# Patient Record
Sex: Male | Born: 1940
Health system: Southern US, Community
[De-identification: ages and names within clinical notes are randomized; demographics above are authoritative.]

## PROBLEM LIST (undated history)

## (undated) DIAGNOSIS — S065X9A Traumatic subdural hemorrhage with loss of consciousness of unspecified duration, initial encounter: Secondary | ICD-10-CM

## (undated) DIAGNOSIS — E119 Type 2 diabetes mellitus without complications: Secondary | ICD-10-CM

## (undated) DIAGNOSIS — S065XAA Traumatic subdural hemorrhage with loss of consciousness status unknown, initial encounter: Secondary | ICD-10-CM

## (undated) DIAGNOSIS — N200 Calculus of kidney: Secondary | ICD-10-CM

## (undated) DIAGNOSIS — R569 Unspecified convulsions: Secondary | ICD-10-CM

## (undated) DIAGNOSIS — M7020 Olecranon bursitis, unspecified elbow: Secondary | ICD-10-CM

## (undated) DIAGNOSIS — I10 Essential (primary) hypertension: Secondary | ICD-10-CM

## (undated) DIAGNOSIS — I639 Cerebral infarction, unspecified: Secondary | ICD-10-CM

## (undated) HISTORY — DX: Traumatic subdural hemorrhage with loss of consciousness of unspecified duration, initial encounter: S06.5X9A

## (undated) HISTORY — DX: Cerebral infarction, unspecified: I63.9

## (undated) HISTORY — DX: Traumatic subdural hemorrhage with loss of consciousness status unknown, initial encounter: S06.5XAA

## (undated) HISTORY — DX: Type 2 diabetes mellitus without complications: E11.9

## (undated) HISTORY — DX: Unspecified convulsions: R56.9

---

## 1993-11-10 HISTORY — PX: REFRACTIVE SURGERY: SHX103

## 2000-08-06 ENCOUNTER — Encounter: Payer: Self-pay | Admitting: Family Medicine

## 2000-08-06 LAB — CONVERTED CEMR LAB
Blood Glucose, Fasting: 106 mg/dL
Blood Glucose, Fasting: 106 mg/dL
PSA: 1 ng/mL
RBC count: 5.41 10*6/uL
TSH: 1.41 microintl units/mL
WBC, blood: 4.7 10*3/uL

## 2001-06-14 ENCOUNTER — Emergency Department (HOSPITAL_COMMUNITY): Admission: EM | Admit: 2001-06-14 | Discharge: 2001-06-14 | Payer: Self-pay | Admitting: Emergency Medicine

## 2001-06-14 ENCOUNTER — Encounter: Payer: Self-pay | Admitting: Emergency Medicine

## 2002-09-16 ENCOUNTER — Encounter: Payer: Self-pay | Admitting: Family Medicine

## 2002-09-16 LAB — CONVERTED CEMR LAB: Blood Glucose, Fasting: 85 mg/dL

## 2004-11-28 ENCOUNTER — Ambulatory Visit: Payer: Self-pay | Admitting: Family Medicine

## 2004-11-28 LAB — CONVERTED CEMR LAB
Blood Glucose, Fasting: 108 mg/dL
PSA: 0.65 ng/mL
TSH: 1.27 microintl units/mL

## 2004-12-03 ENCOUNTER — Ambulatory Visit: Payer: Self-pay | Admitting: Family Medicine

## 2004-12-11 ENCOUNTER — Ambulatory Visit: Payer: Self-pay | Admitting: Family Medicine

## 2005-01-02 ENCOUNTER — Ambulatory Visit: Payer: Self-pay | Admitting: Family Medicine

## 2005-04-15 ENCOUNTER — Ambulatory Visit: Payer: Self-pay | Admitting: Family Medicine

## 2005-12-04 ENCOUNTER — Ambulatory Visit: Payer: Self-pay | Admitting: Family Medicine

## 2005-12-04 LAB — CONVERTED CEMR LAB
Blood Glucose, Fasting: 114 mg/dL
Blood Glucose, Fasting: 114 mg/dL
Hgb A1c MFr Bld: 5.1 %
Microalbumin U total vol: 2.7 mg/L
PSA: 0.99 ng/mL
TSH: 1.54 microintl units/mL
TSH: 2.23 microintl units/mL

## 2005-12-08 ENCOUNTER — Ambulatory Visit: Payer: Self-pay | Admitting: Family Medicine

## 2006-01-14 LAB — FECAL OCCULT BLOOD, GUAIAC: Fecal Occult Blood: NEGATIVE

## 2006-01-15 ENCOUNTER — Ambulatory Visit: Payer: Self-pay | Admitting: Family Medicine

## 2006-12-04 ENCOUNTER — Ambulatory Visit: Payer: Self-pay | Admitting: Family Medicine

## 2006-12-04 LAB — CONVERTED CEMR LAB
ALT: 37 units/L (ref 0–40)
AST: 31 units/L (ref 0–37)
Albumin: 4.3 g/dL (ref 3.5–5.2)
Alkaline Phosphatase: 48 units/L (ref 39–117)
BUN: 16 mg/dL (ref 6–23)
Blood Glucose, Fasting: 84 mg/dL
CO2: 29 meq/L (ref 19–32)
Calcium: 9.8 mg/dL (ref 8.4–10.5)
Chloride: 104 meq/L (ref 96–112)
Cholesterol: 134 mg/dL (ref 0–200)
Creatinine, Ser: 1.1 mg/dL (ref 0.4–1.5)
Creatinine,U: 206.5 mg/dL
GFR calc Af Amer: 87 mL/min
GFR calc non Af Amer: 72 mL/min
Glucose, Bld: 84 mg/dL (ref 70–99)
HDL: 36 mg/dL — ABNORMAL LOW (ref >39.0)
LDL Cholesterol: 87 mg/dL (ref 0–99)
Microalb Creat Ratio: 5.8 mg/g (ref 0.0–30.0)
Microalb, Ur: 1.2 mg/dL (ref 0.0–1.9)
Microalbumin U total vol: 5.8 mg/L
PSA: 0.87 ng/mL
PSA: 0.87 ng/mL (ref 0.10–4.00)
Potassium: 4.1 meq/L (ref 3.5–5.1)
Sodium: 140 meq/L (ref 135–145)
TSH: 1.54 microintl units/mL
TSH: 1.54 microintl units/mL (ref 0.35–5.50)
Total Bilirubin: 2 mg/dL — ABNORMAL HIGH (ref 0.3–1.2)
Total CHOL/HDL Ratio: 3.7
Total Protein: 6.9 g/dL (ref 6.0–8.3)
Triglycerides: 55 mg/dL (ref 0–149)
VLDL: 11 mg/dL (ref 0–40)

## 2006-12-08 ENCOUNTER — Ambulatory Visit: Payer: Self-pay | Admitting: Family Medicine

## 2007-03-11 ENCOUNTER — Ambulatory Visit: Payer: Self-pay | Admitting: Family Medicine

## 2007-04-02 ENCOUNTER — Ambulatory Visit: Payer: Self-pay | Admitting: Family Medicine

## 2008-03-20 ENCOUNTER — Ambulatory Visit: Payer: Self-pay | Admitting: Family Medicine

## 2008-03-20 LAB — CONVERTED CEMR LAB
ALT: 35 units/L (ref 0–53)
AST: 27 units/L (ref 0–37)
Albumin: 4.1 g/dL (ref 3.5–5.2)
Alkaline Phosphatase: 38 units/L — ABNORMAL LOW (ref 39–117)
BUN: 14 mg/dL (ref 6–23)
Bilirubin, Direct: 0.1 mg/dL (ref 0.0–0.3)
CO2: 30 meq/L (ref 19–32)
Calcium: 9 mg/dL (ref 8.4–10.5)
Chloride: 107 meq/L (ref 96–112)
Cholesterol: 132 mg/dL (ref 0–200)
Creatinine, Ser: 0.9 mg/dL (ref 0.4–1.5)
Creatinine,U: 45.5 mg/dL
GFR calc Af Amer: 109 mL/min
GFR calc non Af Amer: 90 mL/min
Glucose, Bld: 121 mg/dL — ABNORMAL HIGH (ref 70–99)
HDL: 28.8 mg/dL — ABNORMAL LOW (ref >39.0)
LDL Cholesterol: 87 mg/dL (ref 0–99)
Microalb Creat Ratio: 46.2 mg/g — ABNORMAL HIGH (ref 0.0–30.0)
Microalb, Ur: 2.1 mg/dL — ABNORMAL HIGH (ref 0.0–1.9)
Potassium: 4.1 meq/L (ref 3.5–5.1)
Sodium: 140 meq/L (ref 135–145)
TSH: 2.1 microintl units/mL (ref 0.35–5.50)
Total Bilirubin: 1.4 mg/dL — ABNORMAL HIGH (ref 0.3–1.2)
Total CHOL/HDL Ratio: 4.6
Total Protein: 6.7 g/dL (ref 6.0–8.3)
Triglycerides: 79 mg/dL (ref 0–149)
VLDL: 16 mg/dL (ref 0–40)

## 2008-03-24 ENCOUNTER — Ambulatory Visit: Payer: Self-pay | Admitting: Family Medicine

## 2008-05-30 ENCOUNTER — Encounter: Payer: Self-pay | Admitting: Family Medicine

## 2008-05-30 DIAGNOSIS — I10 Essential (primary) hypertension: Secondary | ICD-10-CM

## 2008-05-30 DIAGNOSIS — E749 Disorder of carbohydrate metabolism, unspecified: Secondary | ICD-10-CM | POA: Insufficient documentation

## 2008-05-30 DIAGNOSIS — E7800 Pure hypercholesterolemia, unspecified: Secondary | ICD-10-CM | POA: Insufficient documentation

## 2008-05-30 DIAGNOSIS — Z87442 Personal history of urinary calculi: Secondary | ICD-10-CM

## 2009-06-13 ENCOUNTER — Ambulatory Visit: Payer: Self-pay | Admitting: Family Medicine

## 2009-06-13 LAB — CONVERTED CEMR LAB
ALT: 54 units/L — ABNORMAL HIGH (ref 0–53)
AST: 34 units/L (ref 0–37)
Albumin: 4.3 g/dL (ref 3.5–5.2)
Alkaline Phosphatase: 42 units/L (ref 39–117)
BUN: 13 mg/dL (ref 6–23)
Bilirubin, Direct: 0.2 mg/dL (ref 0.0–0.3)
CO2: 30 meq/L (ref 19–32)
Calcium: 9.4 mg/dL (ref 8.4–10.5)
Chloride: 105 meq/L (ref 96–112)
Cholesterol: 107 mg/dL (ref 0–200)
Creatinine, Ser: 0.9 mg/dL (ref 0.4–1.5)
GFR calc non Af Amer: 89.52 mL/min (ref >60)
Glucose, Bld: 110 mg/dL — ABNORMAL HIGH (ref 70–99)
HDL: 26.1 mg/dL — ABNORMAL LOW (ref >39.00)
LDL Cholesterol: 57 mg/dL (ref 0–99)
PSA: 1.12 ng/mL (ref 0.10–4.00)
Potassium: 3.8 meq/L (ref 3.5–5.1)
Sodium: 141 meq/L (ref 135–145)
TSH: 2.07 microintl units/mL (ref 0.35–5.50)
Total Bilirubin: 1.8 mg/dL — ABNORMAL HIGH (ref 0.3–1.2)
Total CHOL/HDL Ratio: 4
Total Protein: 7.3 g/dL (ref 6.0–8.3)
Triglycerides: 119 mg/dL (ref 0.0–149.0)
VLDL: 23.8 mg/dL (ref 0.0–40.0)

## 2009-06-14 LAB — CONVERTED CEMR LAB: Vit D, 25-Hydroxy: 54 ng/mL (ref 30–89)

## 2009-06-18 ENCOUNTER — Ambulatory Visit: Payer: Self-pay | Admitting: Family Medicine

## 2010-06-13 ENCOUNTER — Encounter (INDEPENDENT_AMBULATORY_CARE_PROVIDER_SITE_OTHER): Payer: Self-pay | Admitting: *Deleted

## 2010-09-02 ENCOUNTER — Ambulatory Visit: Payer: Self-pay | Admitting: Family Medicine

## 2010-09-03 LAB — CONVERTED CEMR LAB
Bilirubin, Direct: 0.2 mg/dL (ref 0.0–0.3)
Creatinine, Ser: 1 mg/dL (ref 0.4–1.5)
GFR calc non Af Amer: 81.8 mL/min (ref >60)
Glucose, Bld: 115 mg/dL — ABNORMAL HIGH (ref 70–99)
HDL: 29.8 mg/dL — ABNORMAL LOW (ref >39.00)
LDL Cholesterol: 62 mg/dL (ref 0–99)
Potassium: 4.3 meq/L (ref 3.5–5.1)
Sodium: 136 meq/L (ref 135–145)
Total Bilirubin: 1.3 mg/dL — ABNORMAL HIGH (ref 0.3–1.2)
Total CHOL/HDL Ratio: 4
Triglycerides: 141 mg/dL (ref 0.0–149.0)
VLDL: 28.2 mg/dL (ref 0.0–40.0)

## 2010-09-04 ENCOUNTER — Ambulatory Visit: Payer: Self-pay | Admitting: Family Medicine

## 2010-12-10 NOTE — Letter (Signed)
Summary: Nadara Eaton letter  [place] [facility]  [address] [place], [place] [address]   Phone: [phone]  Fax: [phone]       06/13/2010 MRN: [id number]  [facility] [address] [place], [place]  [address]  Dear Mr. Leeanne Mannan [facility] [place], and [place] announce the retirement of Arta Silence, M.D., from full-time practice at the [facility] office effective May 09, 2010 and his plans of returning part-time.  It is important to Dr. Hetty Ely and to our practice that you understand that [facility] has seven physicians in our office for your health care needs.  We will continue to offer the same exceptional care that you have today.    Dr. Hetty Ely has spoken to many of you about his plans for retirement and returning part-time in the fall.   We will continue to work with you through the transition to schedule appointments for you in the office and meet the high standards that [place] is committed to.   Again, it is with great pleasure that we share the news that Dr. Hetty Ely will return to [facility] at [facility] in October of 2011 with a reduced schedule.    If you have any questions, or would like to request an appointment with one of our physicians, please call us at [phone] and press the option for Scheduling an appointment.  We take pleasure in providing you with excellent patient care and look forward to seeing you at your next office visit.  Our [facility] Physicians are:  Tillman Abide, M.D. Laurita Quint, M.D. Roxy Manns, M.D. Kerby Nora, M.D. Hannah Beat, M.D. Ruthe Mannan, M.D. We proudly welcomed Raechel Ache, M.D. and Eustaquio Boyden, M.D. to the practice in July/August 2011.  Sincerely,  [place] [facility]

## 2010-12-10 NOTE — Assessment & Plan Note (Signed)
Summary: CPX/CLE   Vital Signs:  Patient profile:   70 year old male Weight:      206.50 pounds BMI:     30.60 Temp:     97.9 degrees F oral Pulse rate:   72 / minute Pulse rhythm:   regular BP sitting:   128 / 76  (left arm) Cuff size:   regular  Vitals Entered By: Sydell Axon LPN (September 04, 2010 10:56 AM) CC: 30 minute checkup   History of Present Illness: Pt here for Comp Exam. He feels well and has no complaints. he is taking Hyaluronic Acid which has significantly helped and resolved his pain. He is also taking antioxidants.   Preventive Screening-Counseling & Management  Alcohol-Tobacco     Alcohol drinks/day: 0     Smoking Status: never     Passive Smoke Exposure: no  Caffeine-Diet-Exercise     Caffeine use/day: 0     Does Patient Exercise: yes     Type of exercise: Walks 2 mile     Times/week: 3  Problems Prior to Update: 1)  Special Screening Malignant Neoplasm of Prostate  (ICD-V76.44) 2)  Gilbert's Syndrome  (ICD-277.4) 3)  Renal Calculus, Hx of  (ICD-V13.01) 4)  Health Maintenance Exam  (ICD-V70.0) 5)  Carbohydrate Metabolism Disorder  (ICD-271.9) 6)  Hypercholesterolemia  (ICD-272.0) 7)  Essential Hypertension, Benign  (ICD-401.1) 8)  Bursitis, Olecranon  (ICD-726.33)  Medications Prior to Update: 1)  Triamterene-Hctz 37.5-25 Mg Tabs (Triamterene-Hctz) .... 1/2 Tablet Daily By Mouth 2)  Hyaluronic Acid 20-60 Mg Caps (Hyaluronic Acid-Vitamin C) .Marland Kitchen.. 1 Daily By Mouth 3)  Vitamins C; Vitamin E; Vitamin B6; B12; .... 1 Each Daily By Mouth 4)  Coq10 50 Mg Caps (Coenzyme Q10) .Marland Kitchen.. 1 Daily By Mouth 5)  Garlic Oil 1000 Mg Caps (Garlic) .Marland Kitchen.. 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 [employer] Married, lives w/ wife   Adopted Son Marital Status: Married  Risk Factors: Alcohol Use: 0 (09/04/2010) Caffeine Use: 0 (09/04/2010) Exercise: yes (09/04/2010)  Risk Factors: Smoking Status: never (09/04/2010) Passive Smoke Exposure: no (09/04/2010)  Family History: Father dec 89 Bone Ca Mother dec 84 Stroke Thyr Dz Htn DM Brother A 72 Past  Etoh Thyr Dz Sister A 70  CV: NEGATIVE HBP: + MOTHER DM: + MOTHER  PAST 75 YOA GOUT/ARTHRITIS: PROSTATE CANCER: +FATHER BONE CANCER BREAST/OVARIAN/UTERINE CANCER: NEGATIVE COLON CANCER: DEPRESSION: NEGATIVE ETOH ABUSE: + BROTHER OTHER: POSITIVE STROKE MOTHER; +MGF  Review of Systems General:  Denies chills, fatigue, fever, sweats, weakness, and weight loss. Eyes:  Denies blurring, discharge, and eye pain. ENT:  Complains of ringing in ears; denies decreased hearing and earache; chronic. CV:  Denies chest pain or discomfort, fainting, fatigue, palpitations, shortness of breath with exertion, swelling of feet, and swelling of hands. Resp:  Denies cough, shortness of breath, and wheezing. GI:  Denies abdominal pain, bloody stools, change in bowel habits, constipation, dark tarry stools, diarrhea, indigestion, loss of  appetite, nausea, vomiting, vomiting blood, and yellowish skin color. GU:  Complains of nocturia; denies discharge, dysuria, and incontinence; bedtime water intake. MS:  Denies joint pain, low back pain, muscle aches, cramps, and stiffness. Derm:  Denies dryness, itching, and rash; spot on back. Neuro:  Denies numbness, poor balance, tingling, tremors, and weakness.  Physical Exam  General:   Well-developed,well-nourished,in no acute distress; alert,appropriate and cooperative throughout examination Head:  Normocephalic and atraumatic without obvious abnormalities. No apparent alopecia or balding. Eyes:  Conjunctiva clear bilaterally.  Ears:  External ear exam shows no significant lesions or deformities.  Otoscopic examination reveals clear canals, tympanic membranes are intact bilaterally without bulging, retraction, inflammation or discharge. Hearing is grossly normal bilaterally.  Significant cerumen in the right ear. Nose:  External nasal examination shows no deformity or inflammation. Nasal mucosa are pink and moist without lesions or exudates. Mouth:  Oral mucosa and oropharynx without lesions or exudates.  Teeth in good repair. Neck:  No deformities, masses, or tenderness noted. Chest Wall:  No deformities, masses, tenderness or gynecomastia noted. Breasts:  No masses or gynecomastia noted Lungs:  Normal respiratory effort, chest expands symmetrically. Lungs are clear to auscultation, no crackles or wheezes. Heart:  Normal rate and regular rhythm. S1 and S2 normal without gallop, murmur, click, rub or other extra sounds. Abdomen:  Bowel sounds positive,abdomen soft and non-tender without masses, organomegaly or hernias noted. Rectal:  No external abnormalities noted. Normal sphincter tone. No rectal masses or tenderness. G neg. Genitalia:  Testes bilaterally descended without nodularity, tenderness or masses. No scrotal masses or lesions. No penis lesions or urethral discharge. Prostate:  Prostate gland firm and smooth, no enlargement, nodularity, tenderness, mass, asymmetry or induration. 20gms. Msk:  No deformity or scoliosis noted of thoracic or lumbar spine.   Pulses:  R and L carotid,radial,femoral,dorsalis pedis and posterior tibial pulses are full and equal bilaterally Extremities:  No clubbing, cyanosis, edema, or deformity noted with normal full range of motion of all  joints.   Neurologic:  No cranial nerve deficits noted. Station and gait are normal. Sensory, motor and coordinative functions appear intact. Skin:  Intact without suspicious lesions or rashes Cervical Nodes:  No lymphadenopathy noted Inguinal Nodes:  No significant adenopathy Psych:  Cognition and judgment appear intact. Alert and cooperative with normal attention span and concentration. No apparent delusions, illusions, hallucinations   Impression & Recommendations:  Problem # 1:  HEALTH MAINTENANCE EXAM (ICD-V70.0)  Reviewed preventive care protocols, scheduled due services, and updated immunizations. I have personally reviewed the Medicare Annual Wellness questionnaire and have noted 1.   The patient's medical and social history 2.   Their use of alcohol, tobacco or illicit drugs 3.   Their current medications and supplements 4.   The patient's functional ability including ADL's, fall risks, home safety risks and hearing or visual             impairment. 5.   Diet and physical activities 6.   Evidence for depression or mood disorders  Problem # 2:  SPECIAL SCREENING MALIGNANT NEOPLASM OF PROSTATE (ICD-V76.44) Stable PSA and exam.  Problem # 3:  CARBOHYDRATE METABOLISM DISORDER (ICD-271.9) Assessment: Unchanged Stable, Glu from 120s to 110s typically yearly. Encouraged to avoid sweets and carbs.  Problem # 4:  HYPERCHOLESTEROLEMIA (ICD-272.0) Assessment: Unchanged Acceptable but avoid sweets and carbs. Labs Reviewed: SGOT: 33 (09/02/2010)   SGPT: 45 (09/02/2010)   HDL:29.80 (09/02/2010), 26.10 (06/13/2009)  LDL:62 (09/02/2010), 57 (06/13/2009)  Chol:120 (09/02/2010), 107 (06/13/2009)  Trig:141.0 (09/02/2010), 119.0 (06/13/2009)  Problem # 5:  ESSENTIAL HYPERTENSION, BENIGN (ICD-401.1) Assessment: Improved Stable. Cont. His updated medication list for this problem includes:    Triamterene-hctz 37.5-25 Mg Tabs (Triamterene-hctz) .Marland Kitchen... 1/2 tablet daily by mouth  BP today:  128/76 Prior BP: 140/80 (06/18/2009)  Labs Reviewed: K+: 4.3 (09/02/2010) Creat: : 1.0 (09/02/2010)   Chol: 120 (09/02/2010)   HDL: 29.80 (09/02/2010)   LDL: 62 (09/02/2010)   TG: 141.0 (09/02/2010)  Complete Medication List: 1)  Triamterene-hctz 37.5-25 Mg Tabs (Triamterene-hctz) .... 1/2 tablet daily by mouth 2)  Coq10 50 Mg Caps (Coenzyme q10) .Marland Kitchen.. 1 daily by mouth 3)  Garlic Oil 1000 Mg Caps (Garlic) .Marland Kitchen.. 1 daily by mouth 4)  Vitamin C 1000 Mg Tabs (Ascorbic acid) .... Take one by mouth two times a day 5)  Vitamin E/selenium 400-50 Unit-mcg Caps (Vitamin mixture) .... Take one by mouth daily 6)  Coq-10 200 Mg Caps (Coenzyme q10) .... Take one by mouth daily 7)  Vitamin B-6 100 Mg Tabs (Pyridoxine hcl) .... Take one by mouth daily Prescriptions: TRIAMTERENE-HCTZ 37.5-25 MG TABS (TRIAMTERENE-HCTZ) 1/2 tablet daily by mouth  #45 x 4   Entered and Authorized by:   Shaune Leeks MD   Signed by:   Shaune Leeks MD on 09/04/2010   Method used:   Electronically to        [facility] [phone]* (retail)       [address] [place], [place]  [address]       Ph: [phone]       Fax: [phone]   RxID:   [id number]    Orders Added: 1)  Est. Patient 65& > [24401]    Current Allergies (reviewed today): No known allergies

## 2011-09-04 ENCOUNTER — Other Ambulatory Visit: Payer: Self-pay | Admitting: Family Medicine

## 2011-09-04 DIAGNOSIS — E7800 Pure hypercholesterolemia, unspecified: Secondary | ICD-10-CM

## 2011-09-04 DIAGNOSIS — Z125 Encounter for screening for malignant neoplasm of prostate: Secondary | ICD-10-CM

## 2011-09-04 DIAGNOSIS — E749 Disorder of carbohydrate metabolism, unspecified: Secondary | ICD-10-CM

## 2011-09-08 ENCOUNTER — Other Ambulatory Visit (INDEPENDENT_AMBULATORY_CARE_PROVIDER_SITE_OTHER): Payer: MEDICARE

## 2011-09-08 DIAGNOSIS — Z125 Encounter for screening for malignant neoplasm of prostate: Secondary | ICD-10-CM

## 2011-09-08 DIAGNOSIS — E749 Disorder of carbohydrate metabolism, unspecified: Secondary | ICD-10-CM

## 2011-09-08 DIAGNOSIS — E7800 Pure hypercholesterolemia, unspecified: Secondary | ICD-10-CM

## 2011-09-08 LAB — CBC WITH DIFFERENTIAL/PLATELET
Basophils Relative: 0.8 % (ref 0.0–3.0)
Eosinophils Relative: 2.6 % (ref 0.0–5.0)
HCT: 47.7 % (ref 39.0–52.0)
Lymphs Abs: 1.6 10*3/uL (ref 0.7–4.0)
MCV: 95.8 fl (ref 78.0–100.0)
Monocytes Absolute: 0.4 10*3/uL (ref 0.1–1.0)
Platelets: 169 10*3/uL (ref 150.0–400.0)
WBC: 4.4 10*3/uL — ABNORMAL LOW (ref 4.5–10.5)

## 2011-09-08 LAB — RENAL FUNCTION PANEL
Albumin: 4.2 g/dL (ref 3.5–5.2)
CO2: 28 mEq/L (ref 19–32)
Calcium: 9.5 mg/dL (ref 8.4–10.5)
Chloride: 103 mEq/L (ref 96–112)
Potassium: 4.6 mEq/L (ref 3.5–5.1)

## 2011-09-08 LAB — HEPATIC FUNCTION PANEL
ALT: 40 U/L (ref 0–53)
AST: 29 U/L (ref 0–37)
Alkaline Phosphatase: 44 U/L (ref 39–117)
Bilirubin, Direct: 0.2 mg/dL (ref 0.0–0.3)
Total Bilirubin: 1 mg/dL (ref 0.3–1.2)

## 2011-09-08 LAB — LIPID PANEL
LDL Cholesterol: 62 mg/dL (ref 0–99)
Total CHOL/HDL Ratio: 4

## 2011-09-08 LAB — MICROALBUMIN / CREATININE URINE RATIO: Microalb, Ur: 0.2 mg/dL (ref 0.0–1.9)

## 2011-09-16 ENCOUNTER — Encounter: Payer: Self-pay | Admitting: Family Medicine

## 2011-09-18 ENCOUNTER — Ambulatory Visit (INDEPENDENT_AMBULATORY_CARE_PROVIDER_SITE_OTHER): Payer: MEDICARE | Admitting: Family Medicine

## 2011-09-18 ENCOUNTER — Encounter: Payer: Self-pay | Admitting: Family Medicine

## 2011-09-18 DIAGNOSIS — Z0000 Encounter for general adult medical examination without abnormal findings: Secondary | ICD-10-CM

## 2011-09-18 DIAGNOSIS — I10 Essential (primary) hypertension: Secondary | ICD-10-CM

## 2011-09-18 DIAGNOSIS — E749 Disorder of carbohydrate metabolism, unspecified: Secondary | ICD-10-CM

## 2011-09-18 DIAGNOSIS — E7800 Pure hypercholesterolemia, unspecified: Secondary | ICD-10-CM

## 2011-09-18 NOTE — Patient Instructions (Signed)
iFob sent to pt. HE will consider colonoscopy next year. Declines flu shot. Reasonable to give pneumococcus next year. Call in the summer for appt this time next year for PE appt with either Dr Para March or Dr Reece Agar.

## 2011-09-18 NOTE — Assessment & Plan Note (Signed)
Nos great except for HDL slightly low. Cont Fish Oil and exercise.

## 2011-09-18 NOTE — Assessment & Plan Note (Signed)
Adequate control. Cont current medication regimen. BP Readings from Last 3 Encounters:  09/18/11 128/80  09/04/10 128/76  06/18/09 140/80

## 2011-09-18 NOTE — Assessment & Plan Note (Signed)
Better. LFTs nml this time.

## 2011-09-18 NOTE — Assessment & Plan Note (Signed)
Continues but stable. Again discussed diet control and regular exercise.

## 2011-09-18 NOTE — Progress Notes (Signed)
Subjective:    Patient ID: Gregory Humphrey, male    DOB: 25-Oct-1941, 70 y.o.   MRN: [id number]  HPI Pt here for Comp Exam. He has become a supplement believer and is now on multiple supplements. "I want to stay young." He feels well and has no complaints.    Review of Systems  Constitutional: Negative for fever, chills, diaphoresis, appetite change, fatigue and unexpected weight change.  HENT: Positive for tinnitus (longstanding, stable.). Negative for hearing loss, ear pain and ear discharge.   Eyes: Negative for pain, discharge and visual disturbance.  Respiratory: Negative for cough, shortness of breath and wheezing.   Cardiovascular: Negative for chest pain and palpitations.       No SOB w/ exertion  Gastrointestinal: Negative for nausea, vomiting, abdominal pain, diarrhea, constipation and blood in stool.       No heartburn or swallowing problems.  Genitourinary: Negative for dysuria, frequency and difficulty urinating.       Once a night nocturia. Goes frequently and drinks a lot of water.  Musculoskeletal: Positive for arthralgias (right wrist pain). Negative for myalgias and back pain.  Skin: Negative for rash.       No itching or dryness.  Neurological: Negative for tremors and numbness.       No tingling or balance problems.  Hematological: Negative for adenopathy. Does not bruise/bleed easily.  Psychiatric/Behavioral: Negative for dysphoric mood and agitation.       Objective:   Physical Exam  Constitutional: He is oriented to person, place, and time. He appears well-developed and well-nourished. No distress.  HENT:  Head: Normocephalic and atraumatic.  Right Ear: External ear normal.  Left Ear: External ear normal.  Nose: Nose normal.  Mouth/Throat: Oropharynx is clear and moist.  Eyes: Conjunctivae and EOM are normal. Pupils are equal, round, and reactive to light. Right eye exhibits no discharge. Left eye exhibits no discharge. No scleral icterus.  Neck:  Normal range of motion. Neck supple. No thyromegaly present.  Cardiovascular: Normal rate, regular rhythm, normal heart sounds and intact distal pulses.   No murmur heard. Pulmonary/Chest: Effort normal and breath sounds normal. No respiratory distress. He has no wheezes.  Abdominal: Soft. Bowel sounds are normal. He exhibits no distension and no mass. There is no tenderness. There is no rebound and no guarding.  Genitourinary: Penis normal.       No rectal done this year.  Musculoskeletal: Normal range of motion. He exhibits no edema.  Lymphadenopathy:    He has no cervical adenopathy.  Neurological: He is alert and oriented to person, place, and time. Coordination normal.  Skin: Skin is warm and dry. No rash noted. He is not diaphoretic.  Psychiatric: He has a normal mood and affect. His behavior is normal. Judgment and thought content normal.          Assessment & Plan:  HMPE  I have personally reviewed the Medicare Annual Wellness questionnaire and have noted 1. The patient's medical and social history 2. Their use of alcohol, tobacco or illicit drugs 3. Their current medications and supplements 4. The patient's functional ability including ADL's, fall risks, home safety risks and hearing or visual             impairment. 5. Diet and physical activities 6. Evidence for depression or mood disorders No cognitive deficits noticed.  Pt asked if he NEEDED a colonoscopy as his insurance was pressuring him. He has no FH of clon cancer or polyps, no rectal bleeding or  unexplained abd problems. I said stool cards are a reaonable alternative. Discussed all labs with pt.

## 2011-10-07 ENCOUNTER — Inpatient Hospital Stay (HOSPITAL_COMMUNITY)
Admission: EM | Admit: 2011-10-07 | Discharge: 2011-10-24 | DRG: 003 | Disposition: A | Discharge status: Skilled Nursing Facility | Payer: Medicare Other | Attending: Internal Medicine | Admitting: Internal Medicine

## 2011-10-07 ENCOUNTER — Emergency Department (HOSPITAL_COMMUNITY): Payer: Medicare Other

## 2011-10-07 ENCOUNTER — Encounter (HOSPITAL_COMMUNITY): Payer: Self-pay | Admitting: *Deleted

## 2011-10-07 DIAGNOSIS — Z66 Do not resuscitate: Secondary | ICD-10-CM | POA: Diagnosis present

## 2011-10-07 DIAGNOSIS — S065XAA Traumatic subdural hemorrhage with loss of consciousness status unknown, initial encounter: Secondary | ICD-10-CM

## 2011-10-07 DIAGNOSIS — Z8679 Personal history of other diseases of the circulatory system: Secondary | ICD-10-CM | POA: Diagnosis present

## 2011-10-07 DIAGNOSIS — Z87442 Personal history of urinary calculi: Secondary | ICD-10-CM

## 2011-10-07 DIAGNOSIS — J9600 Acute respiratory failure, unspecified whether with hypoxia or hypercapnia: Secondary | ICD-10-CM

## 2011-10-07 DIAGNOSIS — E119 Type 2 diabetes mellitus without complications: Secondary | ICD-10-CM | POA: Diagnosis not present

## 2011-10-07 DIAGNOSIS — E7800 Pure hypercholesterolemia, unspecified: Secondary | ICD-10-CM

## 2011-10-07 DIAGNOSIS — J189 Pneumonia, unspecified organism: Secondary | ICD-10-CM | POA: Diagnosis present

## 2011-10-07 DIAGNOSIS — S065X9A Traumatic subdural hemorrhage with loss of consciousness of unspecified duration, initial encounter: Secondary | ICD-10-CM

## 2011-10-07 DIAGNOSIS — E870 Hyperosmolality and hypernatremia: Secondary | ICD-10-CM

## 2011-10-07 DIAGNOSIS — J9610 Chronic respiratory failure, unspecified whether with hypoxia or hypercapnia: Secondary | ICD-10-CM

## 2011-10-07 DIAGNOSIS — R4182 Altered mental status, unspecified: Secondary | ICD-10-CM

## 2011-10-07 DIAGNOSIS — G8190 Hemiplegia, unspecified affecting unspecified side: Secondary | ICD-10-CM | POA: Diagnosis present

## 2011-10-07 DIAGNOSIS — I959 Hypotension, unspecified: Secondary | ICD-10-CM | POA: Diagnosis present

## 2011-10-07 DIAGNOSIS — I629 Nontraumatic intracranial hemorrhage, unspecified: Secondary | ICD-10-CM

## 2011-10-07 DIAGNOSIS — R1319 Other dysphagia: Secondary | ICD-10-CM | POA: Diagnosis present

## 2011-10-07 DIAGNOSIS — E875 Hyperkalemia: Secondary | ICD-10-CM | POA: Diagnosis present

## 2011-10-07 DIAGNOSIS — I10 Essential (primary) hypertension: Secondary | ICD-10-CM

## 2011-10-07 DIAGNOSIS — R403 Persistent vegetative state: Secondary | ICD-10-CM

## 2011-10-07 DIAGNOSIS — J411 Mucopurulent chronic bronchitis: Secondary | ICD-10-CM

## 2011-10-07 DIAGNOSIS — I619 Nontraumatic intracerebral hemorrhage, unspecified: Secondary | ICD-10-CM

## 2011-10-07 DIAGNOSIS — I6200 Nontraumatic subdural hemorrhage, unspecified: Principal | ICD-10-CM | POA: Diagnosis present

## 2011-10-07 DIAGNOSIS — I169 Hypertensive crisis, unspecified: Secondary | ICD-10-CM | POA: Diagnosis present

## 2011-10-07 DIAGNOSIS — R5381 Other malaise: Secondary | ICD-10-CM

## 2011-10-07 DIAGNOSIS — R739 Hyperglycemia, unspecified: Secondary | ICD-10-CM

## 2011-10-07 DIAGNOSIS — E749 Disorder of carbohydrate metabolism, unspecified: Secondary | ICD-10-CM

## 2011-10-07 HISTORY — DX: Gilbert syndrome: E80.4

## 2011-10-07 HISTORY — DX: Olecranon bursitis, unspecified elbow: M70.20

## 2011-10-07 HISTORY — DX: Essential (primary) hypertension: I10

## 2011-10-07 HISTORY — DX: Calculus of kidney: N20.0

## 2011-10-07 LAB — DIFFERENTIAL
Basophils Absolute: 0 10*3/uL (ref 0.0–0.1)
Basophils Relative: 0 % (ref 0–1)
Eosinophils Absolute: 0.1 10*3/uL (ref 0.0–0.7)
Monocytes Absolute: 0.9 10*3/uL (ref 0.1–1.0)
Monocytes Relative: 8 % (ref 3–12)
Neutro Abs: 9.4 10*3/uL — ABNORMAL HIGH (ref 1.7–7.7)

## 2011-10-07 LAB — POCT I-STAT, CHEM 8
BUN: 20 mg/dL (ref 6–23)
Calcium, Ion: 1.09 mmol/L — ABNORMAL LOW (ref 1.12–1.32)
Hemoglobin: 16 g/dL (ref 13.0–17.0)
Sodium: 141 mEq/L (ref 135–145)
TCO2: 30 mmol/L (ref 0–100)

## 2011-10-07 LAB — CK TOTAL AND CKMB (NOT AT ARMC)
CK, MB: 2.1 ng/mL (ref 0.3–4.0)
Relative Index: INVALID (ref 0.0–2.5)
Total CK: 78 U/L (ref 7–232)

## 2011-10-07 LAB — CBC
HCT: 43.6 % (ref 39.0–52.0)
Hemoglobin: 14.8 g/dL (ref 13.0–17.0)
MCH: 30.8 pg (ref 26.0–34.0)
MCHC: 33.9 g/dL (ref 30.0–36.0)
RDW: 12.8 % (ref 11.5–15.5)

## 2011-10-07 LAB — COMPREHENSIVE METABOLIC PANEL
AST: 34 U/L (ref 0–37)
Albumin: 4.2 g/dL (ref 3.5–5.2)
BUN: 17 mg/dL (ref 6–23)
Calcium: 9.3 mg/dL (ref 8.4–10.5)
Creatinine, Ser: 0.97 mg/dL (ref 0.50–1.35)
Total Protein: 7.8 g/dL (ref 6.0–8.3)

## 2011-10-07 LAB — TROPONIN I: Troponin I: <0.3 ng/mL (ref <0.30)

## 2011-10-07 LAB — GLUCOSE, CAPILLARY: Glucose-Capillary: 154 mg/dL — ABNORMAL HIGH (ref 70–99)

## 2011-10-07 LAB — APTT: aPTT: 28 seconds (ref 24–37)

## 2011-10-07 LAB — PROTIME-INR: INR: 1.02 (ref 0.00–1.49)

## 2011-10-07 MED ORDER — BIOTENE DRY MOUTH MT LIQD
1.0000 "application " | Freq: Four times a day (QID) | OROMUCOSAL | Status: DC
  Administered 2011-10-08 – 2011-10-24 (×64): 15 mL via OROMUCOSAL
  Filled 2011-10-07: qty 15

## 2011-10-07 MED ORDER — CHLORHEXIDINE GLUCONATE 0.12 % MT SOLN
15.0000 mL | Freq: Two times a day (BID) | OROMUCOSAL | Status: DC
  Administered 2011-10-08 – 2011-10-23 (×33): 15 mL via OROMUCOSAL
  Filled 2011-10-07 (×35): qty 15

## 2011-10-07 MED ORDER — ROCURONIUM BROMIDE 50 MG/5ML IV SOLN
0.6000 mg/kg | Freq: Once | INTRAVENOUS | Status: AC
  Administered 2011-10-07: 100 mg via INTRAVENOUS

## 2011-10-07 MED ORDER — ETOMIDATE 2 MG/ML IV SOLN
30.0000 mg | Freq: Once | INTRAVENOUS | Status: AC
Start: 2011-10-07 — End: 2011-10-07
  Administered 2011-10-07: 30 mg via INTRAVENOUS

## 2011-10-07 MED ORDER — LORAZEPAM 2 MG/ML IJ SOLN
INTRAMUSCULAR | Status: AC
  Administered 2011-10-07: 2 mg
  Filled 2011-10-07: qty 1

## 2011-10-07 MED ORDER — PANTOPRAZOLE SODIUM 40 MG IV SOLR
40.0000 mg | INTRAVENOUS | Status: DC
  Administered 2011-10-08 – 2011-10-09 (×3): 40 mg via INTRAVENOUS
  Filled 2011-10-07 (×4): qty 40

## 2011-10-07 MED ORDER — SODIUM CHLORIDE 0.9 % IV SOLN
INTRAVENOUS | Status: DC
  Administered 2011-10-08: 100 mL/h via INTRAVENOUS
  Administered 2011-10-09 – 2011-10-10 (×2): via INTRAVENOUS

## 2011-10-07 MED ORDER — NICARDIPINE HCL IN NACL 20-0.86 MG/200ML-% IV SOLN
5.0000 mg/h | Freq: Once | INTRAVENOUS | Status: AC
  Administered 2011-10-07: 5 mg/h via INTRAVENOUS
  Filled 2011-10-07: qty 200

## 2011-10-07 NOTE — ED Notes (Signed)
Pt complained of severe headache and called daugter in law and son found patient on the couch unresponsive,  Pt arrived with NRB on and only responsive to pain.  Preparing for intubation.  Bp170/108, cbg-146.  Posturing on arrival

## 2011-10-07 NOTE — ED Notes (Signed)
Dr Venetia Maxon was in speaking with family. No further orders concerning fever. As far as surgical plan, that is still being decided by family. Will continue to monitor.

## 2011-10-07 NOTE — Consult Note (Signed)
Reason for Consult: "unresponsiveness"  HPI: Gregory Humphrey is an 70 y.o. male. Who was found by his son on the coach while having complained of headache earlier. The patient was brought in and had to be intubated due to compromised airway.   Past Medical History  Diagnosis Date  . Hypertension    Past Surgical History  Procedure Date  . Refractive surgery 1995   Family History  Problem Relation Age of Onset  . Stroke Mother   . Hypertension Mother   . Diabetes Mother   . Cancer Father     bone  . Alcohol abuse Brother     past  . Stroke Maternal Grandfather    Social History:  reports that he has never smoked. He has never used smokeless tobacco. He reports that he does not drink alcohol or use illicit drugs.  Allergies: No Known Allergies  Medications: I have reviewed the patient's current medications.  ROS: as above  Blood pressure 156/92, pulse 81, temperature 101.5 F (38.6 C), resp. rate 18, height 6' (1.829 m), SpO2 100.00%.  Neurological exam: non-verbal, did not follow commands, did not open eyes spontaneously, left pupil was non-reactive to light, right pupil reactive, no facial asymmetry, right side withdraws to pain, left side: extensor posturing, plantars mute b/l  Results for orders placed during the hospital encounter of 10/07/11 (from the past 48 hour(s))  PROTIME-INR     Status: Normal   Collection Time   10/07/11  6:26 PM      Component Value Range Comment   Prothrombin Time 13.6  11.6 - 15.2 (seconds)    INR 1.02  0.00 - 1.49    APTT     Status: Normal   Collection Time   10/07/11  6:26 PM      Component Value Range Comment   aPTT 28  24 - 37 (seconds)   CBC     Status: Abnormal   Collection Time   10/07/11  6:26 PM      Component Value Range Comment   WBC 11.6 (*) 4.0 - 10.5 (K/uL)    RBC 4.80  4.22 - 5.81 (MIL/uL)    Hemoglobin 14.8  13.0 - 17.0 (g/dL)    HCT 40.9  81.1 - 91.4 (%)    MCV 90.8  78.0 - 100.0 (fL)    MCH 30.8  26.0 - 34.0  (pg)    MCHC 33.9  30.0 - 36.0 (g/dL)    RDW 78.2  95.6 - 21.3 (%)    Platelets 156  150 - 400 (K/uL)   DIFFERENTIAL     Status: Abnormal   Collection Time   10/07/11  6:26 PM      Component Value Range Comment   Neutrophils Relative 81 (*) 43 - 77 (%)    Neutro Abs 9.4 (*) 1.7 - 7.7 (K/uL)    Lymphocytes Relative 10 (*) 12 - 46 (%)    Lymphs Abs 1.1  0.7 - 4.0 (K/uL)    Monocytes Relative 8  3 - 12 (%)    Monocytes Absolute 0.9  0.1 - 1.0 (K/uL)    Eosinophils Relative 1  0 - 5 (%)    Eosinophils Absolute 0.1  0.0 - 0.7 (K/uL)    Basophils Relative 0  0 - 1 (%)    Basophils Absolute 0.0  0.0 - 0.1 (K/uL)   COMPREHENSIVE METABOLIC PANEL     Status: Abnormal   Collection Time   10/07/11  6:26 PM  Component Value Range Comment   Sodium 140  135 - 145 (mEq/L)    Potassium 4.1  3.5 - 5.1 (mEq/L)    Chloride 100  96 - 112 (mEq/L)    CO2 29  19 - 32 (mEq/L)    Glucose, Bld 158 (*) 70 - 99 (mg/dL)    BUN 17  6 - 23 (mg/dL)    Creatinine, Ser 1.61  0.50 - 1.35 (mg/dL)    Calcium 9.3  8.4 - 10.5 (mg/dL)    Total Protein 7.8  6.0 - 8.3 (g/dL)    Albumin 4.2  3.5 - 5.2 (g/dL)    AST 34  0 - 37 (U/L)    ALT 42  0 - 53 (U/L)    Alkaline Phosphatase 59  39 - 117 (U/L)    Total Bilirubin 0.8  0.3 - 1.2 (mg/dL)    GFR calc non Af Amer 82 (*) >90 (mL/min)    GFR calc Af Amer >90  >90 (mL/min)   CK TOTAL AND CKMB     Status: Normal   Collection Time   10/07/11  6:26 PM      Component Value Range Comment   Total CK 78  7 - 232 (U/L)    CK, MB 2.1  0.3 - 4.0 (ng/mL)    Relative Index RELATIVE INDEX IS INVALID  0.0 - 2.5    TROPONIN I     Status: Normal   Collection Time   10/07/11  6:26 PM      Component Value Range Comment   Troponin I <0.30  <0.30 (ng/mL)   GLUCOSE, CAPILLARY     Status: Abnormal   Collection Time   10/07/11  6:33 PM      Component Value Range Comment   Glucose-Capillary 154 (*) 70 - 99 (mg/dL)   POCT I-STAT, CHEM 8     Status: Abnormal   Collection Time    10/07/11  6:37 PM      Component Value Range Comment   Sodium 141  135 - 145 (mEq/L)    Potassium 4.0  3.5 - 5.1 (mEq/L)    Chloride 100  96 - 112 (mEq/L)    BUN 20  6 - 23 (mg/dL)    Creatinine, Ser 0.96  0.50 - 1.35 (mg/dL)    Glucose, Bld 045 (*) 70 - 99 (mg/dL)    Calcium, Ion 4.09 (*) 1.12 - 1.32 (mmol/L)    TCO2 30  0 - 100 (mmol/L)    Hemoglobin 16.0  13.0 - 17.0 (g/dL)    HCT 81.1  91.4 - 78.2 (%)    Ct Head Wo Contrast  10/07/2011  *RADIOLOGY REPORT*  Clinical Data: Unresponsive with blown pupil.  Complaint of headache earlier today.  CT HEAD WITHOUT CONTRAST  Technique:  Contiguous axial images were obtained from the base of the skull through the vertex without contrast.  Comparison: None.  Findings: There is a large acute left sided subdural hemorrhage overlying the convexity measuring just over 2 cm in greatest thickness.  This is also associated with some adjacent communicating subarachnoid blood and significant mass effect. There is upwards of 16 mm of right subfalcine shift.  Basal cisterns are also partially obliterated due to mass effect.  There is some subarachnoid blood at the level of the basal cisterns. Focus of acute hemorrhage is also present in the right inferior frontal lobe measuring approximately 1 cm.  Cerebral edema present throughout the brain.  There are two discrete foci of  deep white matter infarcts just superior to the left basal ganglia in the region of the corona radiata.  No evidence of skull fracture.  Mucosal thickening present in bilateral maxillary antra and ethmoid air cells.  IMPRESSION: Large, acute subdural hemorrhage over the left convexity with significant mass effect and evidence of subfalcine and uncal herniation.  Associated subarachnoid blood and also parenchymal hemorrhage in the inferior right frontal lobe.  Foci of deep white matter infarcts noted.  Critical Value/emergent results were called by telephone at the time of interpretation on 10/07/2011   at 1945 hours  to  Dr. Deretha Emory, who verbally acknowledged these results.  Findings also reviewed with Dr. Lyman Speller from Neurology.  Original Report Authenticated By: Reola Calkins, M.D.   Dg Chest Portable 1 View  10/07/2011  *RADIOLOGY REPORT*  Clinical Data: Intubation.  PORTABLE CHEST - 1 VIEW  Comparison: None  Findings: The endotracheal tube is 3 cm above the carina.  The heart is normal in size.  There is tortuosity and ectasia of the thoracic aorta.  Low lung volumes with vascular crowding and atelectasis but no edema or effusions.  The bony thorax is intact.  IMPRESSION:  1.  The endotracheal tube is 3 cm above the carina. 2.  Low lung volumes with vascular crowding and atelectasis.  Original Report Authenticated By: P. Loralie Champagne, M.D.   Assessment/Plan: 70 years old man who was found unresponsive and had to be intubated in ED due to compromised airway. Patient had a CT head that showed a large left subdural hematoma with mass affect. Patient also had a right hemorrhagic frontal lobe infarct as well as developing subcortical left hemisphere infarcts and some blood in the basal cisterns.  1) Stat Neurosurgery Consult for evacuation and further management 2) Keep SBP less than 140 with nicardipine drip 3) No anti-platelets and anti-coagulant medications 4) ICU after surgery 5) Will follow  Jourdain Guay 10/07/2011, 8:46 PM

## 2011-10-07 NOTE — ED Notes (Signed)
Pt with GCS=3. Pt on ventilator. V/S stable. Left pupil blown. Called for r/t to assist with transport to CT. CT 2 is holding for our arrival. Wife to bedside to visit her husban

## 2011-10-07 NOTE — H&P (Signed)
Name: Gregory Humphrey MRN: [id number] DOB: 1941-06-29  DOS: 10/07/2011    LOS: 0  CRITICAL CARE ADMISSION NOTE  History of Present Illness: 70 y/o man with h/o HTN brought to [facility] ED unresponsive after complaining of headache earlier.  Intubated for airway protection.  Head CT demonstrated large acute subdural, subarachnoid and parenchimal hemorrhage. Assessed by neurosurgery and deemed not to be a surgical candidate.  Brought to ICU with family expressing wishes for comfort care and DNR.  Lines / Drains: 11/27  ETT>>> 11/27  Foley>>>  Cultures / Sepsis markers: None  Antibiotics: None  Tests / Events: 11/27  Found unresponsive.  Head CT demonstrated large acute subdural, subarachnoid and parenchimal hemorrhage.  The patient is sedated, intubated and unable to provide history, which was obtained for available medical records.    Past Medical History  Diagnosis Date  . Hypertension     Past Surgical History  Procedure Date  . Refractive surgery 1995   Prior to Admission medications   Medication Sig Start Date End Date Taking? Authorizing Provider  Alpha-Lipoic Acid 200 MG TABS Take by mouth daily.     Historical Provider, MD  Ascorbic Acid (VITAMIN C) 1000 MG tablet Take 1,000 mg by mouth 3 (three) times daily.     Historical Provider, MD  Astaxanthin 4 MG CAPS Take by mouth daily.      Historical Provider, MD  Calcium Carb-Cholecalciferol (CALCIUM 1000 + D PO) Take by mouth daily.      Historical Provider, MD  Coenzyme Q10 (COQ-10) 200 MG CAPS Take by mouth daily.      Historical Provider, MD  Cyanocobalamin (B-12) 2500 MCG TABS Take by mouth daily.      Historical Provider, MD  DHEA 50 MG CAPS Take by mouth daily.      Historical Provider, MD  Flaxseed, Linseed, (FLAX SEEDS PO) Take by mouth 2 (two) times daily.      Historical Provider, MD  folic acid (FOLVITE) 800 MCG tablet Take 8 pills by mouth daily     Historical Provider, MD  Garlic Oil 1000 MG CAPS Take by mouth  daily.      Historical Provider, MD  Ginkgo Biloba 120 MG CAPS Take by mouth daily.      Historical Provider, MD  Hyaluronic Acid-Vitamin C (HYALURONIC ACID PO) Take by mouth daily.      Historical Provider, MD  Magnesium 400 MG CAPS Take by mouth daily.      Historical Provider, MD  Melatonin 5 MG CAPS Take by mouth at bedtime.      Historical Provider, MD  Omega-3 Fatty Acids (FISH OIL) 1000 MG CAPS Take by mouth 2 (two) times daily.      Historical Provider, MD  Pregnenolone Micronized POWD by Does not apply route daily.      Historical Provider, MD  pyridOXINE (VITAMIN B-6) 100 MG tablet Take 100 mg by mouth daily.      Historical Provider, MD  Specialty Vitamins Products (BILBERRY PLUS PO) Take by mouth 2 (two) times daily.      Historical Provider, MD  triamterene-hydrochlorothiazide (MAXZIDE-25) 37.5-25 MG per tablet Take 1/2 by mouth daily     Historical Provider, MD  Vitamin Mixture (VITAMIN E-SELENIUM) 400-50 UNIT-MCG CAPS Take by mouth daily.      Historical Provider, MD   No Known Allergies  Family History  Problem Relation Age of Onset  . Stroke Mother   . Hypertension Mother   . Diabetes Mother   .  Cancer Father     bone  . Alcohol abuse Brother     past  . Stroke Maternal Grandfather    Social History  reports that he has never smoked. He has never used smokeless tobacco. He reports that he does not drink alcohol or use illicit drugs.  Review Of Systems  11 points review of systems is negative with an exception of listed in HPI.  Vital Signs:  Reviewed  Physical Examination: Neuro:  Intubated, unresponsive, weak cough and gag HEENT:  Sluggish unequal pupils  Heart:  RRR, no M/R/G Lungs:  Bilateral air entry, no W/R/R Abdomen:  Soft, non tender, bowel sounds present Extremities: No edema  Labs and Imaging:  Reviewed.  Please refer to the Assessment and Plan section for relevant results.  Assessment and Plan: Large acute subdural, subarachnoid and parenchimal  hemorrhage.  Not a surgical candidate.  Family is considering comfort measures only.  Presently DNR. -->monitor -->sedation PRN  Acute ventilator dependent respiratory failure secondary to inability to protect airway -->full support for now -->await family decision in withdrawing support -->postintubation ABG  Best practices / Disposition: -->ICU status under PCCM, Neurosurgery consulted - not a surgical candidate -->DNR -->SCDs for DVT Px -->Protonix for GI Px -->ventilator bundle -->NPO -->family updated at bedside, considering comfort measures  The patient is critically ill with multiple organ systems failure and requires high complexity decision making for assessment and support, frequent evaluation and titration of therapies, application of advanced monitoring technologies and extensive interpretation of multiple databases. Critical Care Time devoted to patient care services described in this note is 35 minutes.  Orlean Bradford, M.D. Pulmonary and Critical Care Medicine [facility] Cell: [phone]  10/07/2011, 11:14 PM

## 2011-10-07 NOTE — ED Notes (Signed)
Pt in room 4 and preparing to intubate.  Pt appears to be posturing on arrival.  100%/NRB.  Respiratory at bedside and Stroke MD at bedside.

## 2011-10-07 NOTE — ED Provider Notes (Signed)
I saw and evaluated the patient, reviewed the resident's note and I agree with the findings and plan.  Patient seen by me along with the resident also reviewed EKG and agree with the resident's findings.  Patient arrived via [facility] with significant altered mental status, posturing, questionable seizure, dilated left pupil. Not alert. Suspect a significant head bleed. The patient was intubated. By the resident with me assisting.  Head CT depicts subdural and subarachnoid bleed highly suspicious for aneurysm. Neurosurgery called to see patient. Family updated.   CRITICAL CARE Performed by: Shelda Jakes.   Total critical care time: 60  Critical care time was exclusive of separately billable procedures and treating other patients.  Critical care was necessary to treat or prevent imminent or life-threatening deterioration.  Critical care was time spent personally by me on the following activities: development of treatment plan with patient and/or surrogate as well as nursing, discussions with consultants, evaluation of patient's response to treatment, examination of patient, obtaining history from patient or surrogate, ordering and performing treatments and interventions, ordering and review of laboratory studies, ordering and review of radiographic studies, pulse oximetry and re-evaluation of patient's condition.   Shelda Jakes, MD 10/07/11 2014

## 2011-10-07 NOTE — ED Notes (Signed)
Dr Venetia Maxon at bedside. (neurosurgeon)

## 2011-10-07 NOTE — ED Provider Notes (Signed)
History     CSN: [id number] Arrival date & time: 10/07/2011  6:17 PM   First MD Initiated Contact with Patient 10/07/11 1840      Chief Complaint  Patient presents with  . Altered Mental Status    (Consider location/radiation/quality/duration/timing/severity/associated sxs/prior treatment) Patient is a 70 y.o. male presenting with altered mental status. The history is provided by the EMS personnel. The history is limited by the condition of the patient.  Altered Mental Status   patient is a 70 year old male with a history of hypertension who presents with altered mental status. Patient had reported headache to family at about 4 PM. Later patient had a period of loss of consciousness. This was severe, abrupt, and constant. When EMS arrived patient had pupil asymmetry and was only responsive to pain. No evidence of trauma to head or neck or back pain no history of fall. Patient transported via EMS. En route he had decreasing neurologic function and agonal respirations.  Past Medical History  Diagnosis Date  . Hypertension     Past Surgical History  Procedure Date  . Refractive surgery 1995    Family History  Problem Relation Age of Onset  . Stroke Mother   . Hypertension Mother   . Diabetes Mother   . Cancer Father     bone  . Alcohol abuse Brother     past  . Stroke Maternal Grandfather     History  Substance Use Topics  . Smoking status: Never Smoker   . Smokeless tobacco: Never Used  . Alcohol Use: No      Review of Systems  Unable to perform ROS: Intubated  Psychiatric/Behavioral: Positive for altered mental status.    Allergies  Review of patient's allergies indicates no known allergies.  Home Medications   Current Outpatient Rx  Name Route Sig Dispense Refill  . ALPHA-LIPOIC ACID 200 MG PO TABS Oral Take by mouth daily.     Marland Kitchen VITAMIN C 1000 MG PO TABS Oral Take 1,000 mg by mouth 3 (three) times daily.     . ASTAXANTHIN 4 MG PO CAPS Oral Take by  mouth daily.      Marland Kitchen CALCIUM 1000 + D PO Oral Take by mouth daily.      . COQ-10 200 MG PO CAPS Oral Take by mouth daily.      . B-12 2500 MCG PO TABS Oral Take by mouth daily.      Marland Kitchen DHEA 50 MG PO CAPS Oral Take by mouth daily.      Marland Kitchen FLAX SEEDS PO Oral Take by mouth 2 (two) times daily.      Marland Kitchen FOLIC ACID 800 MCG PO TABS  Take 8 pills by mouth daily     . GARLIC OIL 1000 MG PO CAPS Oral Take by mouth daily.      Marland Kitchen GINKGO BILOBA 120 MG PO CAPS Oral Take by mouth daily.      Marland Kitchen HYALURONIC ACID PO Oral Take by mouth daily.      Marland Kitchen MAGNESIUM 400 MG PO CAPS Oral Take by mouth daily.      Marland Kitchen MELATONIN 5 MG PO CAPS Oral Take by mouth at bedtime.      Marland Kitchen FISH OIL 1000 MG PO CAPS Oral Take by mouth 2 (two) times daily.      Marland Kitchen PREGNENOLONE MICRONIZED POWD Does not apply by Does not apply route daily.      Marland Kitchen VITAMIN B-6 100 MG PO TABS Oral Take 100 mg  by mouth daily.      Marland Kitchen BILBERRY PLUS PO Oral Take by mouth 2 (two) times daily.      . TRIAMTERENE-HCTZ 37.5-25 MG PO TABS  Take 1/2 by mouth daily     . VITAMIN E-SELENIUM 400-50 UNIT-MCG PO CAPS Oral Take by mouth daily.        BP 156/92  Pulse 81  Temp 101.5 F (38.6 C)  Resp 18  Ht 6' (1.829 m)  SpO2 100%  Physical Exam  Nursing note and vitals reviewed. Constitutional: He appears well-developed and well-nourished.       Unresponsive   HENT:  Head: Normocephalic and atraumatic.  Nose: Nose normal.  Eyes: No scleral icterus.       Pupil asymmetry and minimally reactive  Neck: Normal range of motion. Neck supple. No JVD present.  Cardiovascular: Intact distal pulses.        tachycardia  Pulmonary/Chest:       Agonal respirations Good air movement bilaterally  Abdominal: Soft. Bowel sounds are normal. He exhibits no distension. There is no tenderness.  Musculoskeletal: He exhibits no edema.       No trauma  Neurological: He is unresponsive. GCS eye subscore is 1. GCS verbal subscore is 1. GCS motor subscore is 3.       Withdraws to  pain Extensor posturing of BLE Flexor posturing of BUE   Skin: Skin is warm and dry.    ED Course  INTUBATION Performed by: Milus Glazier Authorized by: Milus Glazier Consent: The procedure was performed in an emergent situation. Indications: airway protection Intubation method: direct Patient status: paralyzed (RSI) Preoxygenation: BVM Sedatives: etomidate Paralytic: rocuronium Laryngoscope size: Mac 4 Tube size: 8.0 mm Tube type: cuffed Number of attempts: 1 Cricoid pressure: yes Cords visualized: yes Post-procedure assessment: chest rise and ETCO2 monitor Breath sounds: equal Cuff inflated: yes ETT to teeth: 24 cm Tube secured with: ETT holder Chest x-ray interpreted by me. Chest x-ray findings: endotracheal tube in appropriate position Patient tolerance: Patient tolerated the procedure well with no immediate complications.   (including critical care time)  Labs Reviewed  CBC - Abnormal; Notable for the following:    WBC 11.6 (*)    All other components within normal limits  DIFFERENTIAL - Abnormal; Notable for the following:    Neutrophils Relative 81 (*)    Neutro Abs 9.4 (*)    Lymphocytes Relative 10 (*)    All other components within normal limits  COMPREHENSIVE METABOLIC PANEL - Abnormal; Notable for the following:    Glucose, Bld 158 (*)    GFR calc non Af Amer 82 (*)    All other components within normal limits  GLUCOSE, CAPILLARY - Abnormal; Notable for the following:    Glucose-Capillary 154 (*)    All other components within normal limits  POCT I-STAT, CHEM 8 - Abnormal; Notable for the following:    Glucose, Bld 159 (*)    Calcium, Ion 1.09 (*)    All other components within normal limits  PROTIME-INR  APTT  CK TOTAL AND CKMB  TROPONIN I  I-STAT, CHEM 8   Ct Head Wo Contrast  10/07/2011  *RADIOLOGY REPORT*  Clinical Data: Unresponsive with blown pupil.  Complaint of headache earlier today.  CT HEAD WITHOUT CONTRAST  Technique:   Contiguous axial images were obtained from the base of the skull through the vertex without contrast.  Comparison: None.  Findings: There is a large acute left sided subdural hemorrhage overlying the convexity measuring  just over 2 cm in greatest thickness.  This is also associated with some adjacent communicating subarachnoid blood and significant mass effect. There is upwards of 16 mm of right subfalcine shift.  Basal cisterns are also partially obliterated due to mass effect.  There is some subarachnoid blood at the level of the basal cisterns. Focus of acute hemorrhage is also present in the right inferior frontal lobe measuring approximately 1 cm.  Cerebral edema present throughout the brain.  There are two discrete foci of deep white matter infarcts just superior to the left basal ganglia in the region of the corona radiata.  No evidence of skull fracture.  Mucosal thickening present in bilateral maxillary antra and ethmoid air cells.  IMPRESSION: Large, acute subdural hemorrhage over the left convexity with significant mass effect and evidence of subfalcine and uncal herniation.  Associated subarachnoid blood and also parenchymal hemorrhage in the inferior right frontal lobe.  Foci of deep white matter infarcts noted.  Critical Value/emergent results were called by telephone at the time of interpretation on 10/07/2011  at 1945 hours  to  Dr. Deretha Emory, who verbally acknowledged these results.  Findings also reviewed with Dr. Lyman Speller from Neurology.  Original Report Authenticated By: Reola Calkins, M.D.   Dg Chest Portable 1 View  10/07/2011  *RADIOLOGY REPORT*  Clinical Data: Intubation.  PORTABLE CHEST - 1 VIEW  Comparison: None  Findings: The endotracheal tube is 3 cm above the carina.  The heart is normal in size.  There is tortuosity and ectasia of the thoracic aorta.  Low lung volumes with vascular crowding and atelectasis but no edema or effusions.  The bony thorax is intact.  IMPRESSION:  1.  The  endotracheal tube is 3 cm above the carina. 2.  Low lung volumes with vascular crowding and atelectasis.  Original Report Authenticated By: P. Loralie Champagne, M.D.    Date: 10/07/2011  Rate: 104  Rhythm: sinus tachycardia  QRS Axis: normal  Intervals: normal  ST/T Wave abnormalities: nonspecific T wave changes  Conduction Disutrbances:none  Narrative Interpretation:   Old EKG Reviewed: none available    No diagnosis found. Acute subarachnoid hemorrhage Acute subdural hemorrhage   MDM   Patient presented with acute loss of consciousness and decreased neuro function. On arrival had flexion posturing in bilateral upper extremities and extensor posturing in bilateral lower extremities.  Did withdraw to pain. Patient initially with agonal respirations and intubated on arrival. No complications with intubation. CT head reveals extensive intracranial hemorrhage (subarachnoid and subdural hemorrhage). No evidence of skull fracture other trauma. There is significant midline shift on CT scan.  No coagulopathies.  BP control initiated.  Neurology was involved in patient care upon patient's arrival as a code stroke. Based on CT findings Neurosurgery consulted.  Pt's overall prognosis is poor and he would likely not benefit from emergent surgery.  Will admit to ICU for observation.  Discussed goals of care with family and they request DNR.        Milus Glazier 10/08/11 0022  Kathlene November Latanga Nedrow 10/08/11 Annabelle Harman Kaiea Esselman 10/08/11 [id number]

## 2011-10-07 NOTE — ED Notes (Signed)
Call to Dr Venetia Maxon ZO:[id number]

## 2011-10-07 NOTE — Consult Note (Signed)
Reason for Consult:Brain hemorrhage Referring Physician: Berlin Mokry is an 70 y.o. male.  HPI: 70 yo healthy male who spoke to daughter today at 4 pm c/o ha.  Was then found by son laying on couch unresponsive.  Son is in training as Radiation protection practitioner.  Initial assessment revealed unresponsive patient with agonal respirations.  No response to pain.  Brought to [facility] and intubated in ER.  Head CT shows SAH and left SDH with 1.4cm left to right shift with subfalcine herniation.  Abnormal vasculature appreciated in posterior fossa, including dilated left vertebral and basilar arteries and ectatic course of vasculature.  Patient has remained unresponsive.  Past Medical History  Diagnosis Date  . Hypertension     Past Surgical History  Procedure Date  . Refractive surgery 1995    Family History  Problem Relation Age of Onset  . Stroke Mother   . Hypertension Mother   . Diabetes Mother   . Cancer Father     bone  . Alcohol abuse Brother     past  . Stroke Maternal Grandfather     Social History:  reports that he has never smoked. He has never used smokeless tobacco. He reports that he does not drink alcohol or use illicit drugs.  Allergies: No Known Allergies  Medications: I have reviewed the patient's current medications.  Results for orders placed during the hospital encounter of 10/07/11 (from the past 48 hour(s))  PROTIME-INR     Status: Normal   Collection Time   10/07/11  6:26 PM      Component Value Range Comment   Prothrombin Time 13.6  11.6 - 15.2 (seconds)    INR 1.02  0.00 - 1.49    APTT     Status: Normal   Collection Time   10/07/11  6:26 PM      Component Value Range Comment   aPTT 28  24 - 37 (seconds)   CBC     Status: Abnormal   Collection Time   10/07/11  6:26 PM      Component Value Range Comment   WBC 11.6 (*) 4.0 - 10.5 (K/uL)    RBC 4.80  4.22 - 5.81 (MIL/uL)    Hemoglobin 14.8  13.0 - 17.0 (g/dL)    HCT 16.1  09.6 - 04.5 (%)    MCV 90.8   78.0 - 100.0 (fL)    MCH 30.8  26.0 - 34.0 (pg)    MCHC 33.9  30.0 - 36.0 (g/dL)    RDW 40.9  81.1 - 91.4 (%)    Platelets 156  150 - 400 (K/uL)   DIFFERENTIAL     Status: Abnormal   Collection Time   10/07/11  6:26 PM      Component Value Range Comment   Neutrophils Relative 81 (*) 43 - 77 (%)    Neutro Abs 9.4 (*) 1.7 - 7.7 (K/uL)    Lymphocytes Relative 10 (*) 12 - 46 (%)    Lymphs Abs 1.1  0.7 - 4.0 (K/uL)    Monocytes Relative 8  3 - 12 (%)    Monocytes Absolute 0.9  0.1 - 1.0 (K/uL)    Eosinophils Relative 1  0 - 5 (%)    Eosinophils Absolute 0.1  0.0 - 0.7 (K/uL)    Basophils Relative 0  0 - 1 (%)    Basophils Absolute 0.0  0.0 - 0.1 (K/uL)   COMPREHENSIVE METABOLIC PANEL     Status: Abnormal  Collection Time   10/07/11  6:26 PM      Component Value Range Comment   Sodium 140  135 - 145 (mEq/L)    Potassium 4.1  3.5 - 5.1 (mEq/L)    Chloride 100  96 - 112 (mEq/L)    CO2 29  19 - 32 (mEq/L)    Glucose, Bld 158 (*) 70 - 99 (mg/dL)    BUN 17  6 - 23 (mg/dL)    Creatinine, Ser 1.19  0.50 - 1.35 (mg/dL)    Calcium 9.3  8.4 - 10.5 (mg/dL)    Total Protein 7.8  6.0 - 8.3 (g/dL)    Albumin 4.2  3.5 - 5.2 (g/dL)    AST 34  0 - 37 (U/L)    ALT 42  0 - 53 (U/L)    Alkaline Phosphatase 59  39 - 117 (U/L)    Total Bilirubin 0.8  0.3 - 1.2 (mg/dL)    GFR calc non Af Amer 82 (*) >90 (mL/min)    GFR calc Af Amer >90  >90 (mL/min)   CK TOTAL AND CKMB     Status: Normal   Collection Time   10/07/11  6:26 PM      Component Value Range Comment   Total CK 78  7 - 232 (U/L)    CK, MB 2.1  0.3 - 4.0 (ng/mL)    Relative Index RELATIVE INDEX IS INVALID  0.0 - 2.5    TROPONIN I     Status: Normal   Collection Time   10/07/11  6:26 PM      Component Value Range Comment   Troponin I <0.30  <0.30 (ng/mL)   GLUCOSE, CAPILLARY     Status: Abnormal   Collection Time   10/07/11  6:33 PM      Component Value Range Comment   Glucose-Capillary 154 (*) 70 - 99 (mg/dL)   POCT I-STAT, CHEM 8      Status: Abnormal   Collection Time   10/07/11  6:37 PM      Component Value Range Comment   Sodium 141  135 - 145 (mEq/L)    Potassium 4.0  3.5 - 5.1 (mEq/L)    Chloride 100  96 - 112 (mEq/L)    BUN 20  6 - 23 (mg/dL)    Creatinine, Ser 1.47  0.50 - 1.35 (mg/dL)    Glucose, Bld 829 (*) 70 - 99 (mg/dL)    Calcium, Ion 5.62 (*) 1.12 - 1.32 (mmol/L)    TCO2 30  0 - 100 (mmol/L)    Hemoglobin 16.0  13.0 - 17.0 (g/dL)    HCT 13.0  86.5 - 78.4 (%)     Ct Head Wo Contrast  10/07/2011  *RADIOLOGY REPORT*  Clinical Data: Unresponsive with blown pupil.  Complaint of headache earlier today.  CT HEAD WITHOUT CONTRAST  Technique:  Contiguous axial images were obtained from the base of the skull through the vertex without contrast.  Comparison: None.  Findings: There is a large acute left sided subdural hemorrhage overlying the convexity measuring just over 2 cm in greatest thickness.  This is also associated with some adjacent communicating subarachnoid blood and significant mass effect. There is upwards of 16 mm of right subfalcine shift.  Basal cisterns are also partially obliterated due to mass effect.  There is some subarachnoid blood at the level of the basal cisterns. Focus of acute hemorrhage is also present in the right inferior frontal lobe measuring approximately 1 cm.  Cerebral edema present throughout the brain.  There are two discrete foci of deep white matter infarcts just superior to the left basal ganglia in the region of the corona radiata.  No evidence of skull fracture.  Mucosal thickening present in bilateral maxillary antra and ethmoid air cells.  IMPRESSION: Large, acute subdural hemorrhage over the left convexity with significant mass effect and evidence of subfalcine and uncal herniation.  Associated subarachnoid blood and also parenchymal hemorrhage in the inferior right frontal lobe.  Foci of deep white matter infarcts noted.  Critical Value/emergent results were called by  telephone at the time of interpretation on 10/07/2011  at 1945 hours  to  Dr. Deretha Emory, who verbally acknowledged these results.  Findings also reviewed with Dr. Lyman Speller from Neurology.  Original Report Authenticated By: Reola Calkins, M.D.   Dg Chest Portable 1 View  10/07/2011  *RADIOLOGY REPORT*  Clinical Data: Intubation.  PORTABLE CHEST - 1 VIEW  Comparison: None  Findings: The endotracheal tube is 3 cm above the carina.  The heart is normal in size.  There is tortuosity and ectasia of the thoracic aorta.  Low lung volumes with vascular crowding and atelectasis but no edema or effusions.  The bony thorax is intact.  IMPRESSION:  1.  The endotracheal tube is 3 cm above the carina. 2.  Low lung volumes with vascular crowding and atelectasis.  Original Report Authenticated By: P. Loralie Champagne, M.D.    Review of Systems - Negative except high blood pressure.    Blood pressure 156/92, pulse 81, temperature 101.5 F (38.6 C), resp. rate 18, height 6' (1.829 m), SpO2 100.00%. Unresponsive intubated man.  No eye opening. Pupils left 3- 4mm unreactive.  Right 2-70mm unreactive.  No corneal reflex left, weak on right.  No Doll's eyes.  Extensor posturing of left greater than right upper extremities.  Min movement of lower extremities.  Assessment/Plan:  70 yo man with ICH (SAH and SDH), likely aneurysmal with no h/o trauma.  Prognosis for meaningful recovery is dismal.  I explained this to family.  They say he has a living will and would not like to be kept alive on machines, also would not like to live a diminished existence in a nursing home.  I have recommended against surgery.  They wish to continue with supportive care and recognize that if he doesn't improve, withdrawal of care may be appropriate.  They agree with DNR.  Plan is to admit patient to CCM service.  Repeat head CT in am.   Dorian Heckle, MD 10/07/2011, 8:40 PM

## 2011-10-07 NOTE — ED Notes (Signed)
Dr Venetia Maxon has advised family has decisded for genereal supportive care at this time, no surgical intervention at this time. Gregory Humphrey has arrived to be with family. Critical care will admit pt.

## 2011-10-07 NOTE — ED Notes (Signed)
Dr. Lance Coon at bedside examining PT. He has spoken to PT's family.

## 2011-10-07 NOTE — ED Notes (Signed)
Paged Dr. Venetia Maxon to [phone]

## 2011-10-08 ENCOUNTER — Encounter (HOSPITAL_COMMUNITY): Payer: Self-pay | Admitting: *Deleted

## 2011-10-08 ENCOUNTER — Inpatient Hospital Stay (HOSPITAL_COMMUNITY): Payer: Medicare Other

## 2011-10-08 ENCOUNTER — Inpatient Hospital Stay (HOSPITAL_COMMUNITY): Payer: Medicare Other | Admitting: *Deleted

## 2011-10-08 ENCOUNTER — Encounter (HOSPITAL_COMMUNITY)
Admission: EM | Disposition: A | Discharge status: Skilled Nursing Facility | Payer: Self-pay | Source: Home / Self Care | Attending: Pulmonary Disease

## 2011-10-08 ENCOUNTER — Encounter (HOSPITAL_COMMUNITY): Payer: Self-pay

## 2011-10-08 DIAGNOSIS — Z8679 Personal history of other diseases of the circulatory system: Secondary | ICD-10-CM | POA: Diagnosis present

## 2011-10-08 DIAGNOSIS — I619 Nontraumatic intracerebral hemorrhage, unspecified: Secondary | ICD-10-CM | POA: Diagnosis present

## 2011-10-08 HISTORY — PX: CRANIOTOMY: SHX93

## 2011-10-08 LAB — POCT I-STAT 3, ART BLOOD GAS (G3+)
Patient temperature: 38.7
pCO2 arterial: 39.5 mmHg (ref 35.0–45.0)
pH, Arterial: 7.482 — ABNORMAL HIGH (ref 7.350–7.450)

## 2011-10-08 LAB — MRSA PCR SCREENING: MRSA by PCR: NEGATIVE

## 2011-10-08 SURGERY — CRANIOTOMY HEMATOMA EVACUATION SUBDURAL
Anesthesia: General | Site: Head | Laterality: Left | Wound class: Clean

## 2011-10-08 MED ORDER — IOHEXOL 350 MG/ML SOLN
50.0000 mL | Freq: Once | INTRAVENOUS | Status: AC | PRN
  Administered 2011-10-08: 50 mL via INTRAVENOUS

## 2011-10-08 MED ORDER — FENTANYL CITRATE 0.05 MG/ML IJ SOLN
INTRAMUSCULAR | Status: DC | PRN
  Administered 2011-10-08: 100 ug via INTRAVENOUS

## 2011-10-08 MED ORDER — CEFAZOLIN SODIUM 1-5 GM-% IV SOLN
INTRAVENOUS | Status: DC | PRN
  Administered 2011-10-08: 1 g via INTRAVENOUS

## 2011-10-08 MED ORDER — SODIUM CHLORIDE 0.9 % IV SOLN
10.0000 mg | INTRAVENOUS | Status: DC | PRN
  Administered 2011-10-08: 50 ug/min via INTRAVENOUS

## 2011-10-08 MED ORDER — SODIUM CHLORIDE 0.9 % IV SOLN
500.0000 mg | Freq: Two times a day (BID) | INTRAVENOUS | Status: DC
  Administered 2011-10-08 – 2011-10-09 (×2): 500 mg via INTRAVENOUS
  Filled 2011-10-08 (×3): qty 5

## 2011-10-08 MED ORDER — LIDOCAINE-EPINEPHRINE 1 %-1:100000 IJ SOLN
INTRAMUSCULAR | Status: DC | PRN
  Administered 2011-10-08: 30 mL

## 2011-10-08 MED ORDER — PROPOFOL 10 MG/ML IV EMUL
INTRAVENOUS | Status: AC
  Administered 2011-10-09: 30 ug/kg/min via INTRAVENOUS
  Filled 2011-10-08: qty 100

## 2011-10-08 MED ORDER — LACOSAMIDE 200 MG/20ML IV SOLN
50.0000 mg | Freq: Two times a day (BID) | INTRAVENOUS | Status: DC
  Administered 2011-10-08: 50 mg via INTRAVENOUS
  Filled 2011-10-08 (×3): qty 5

## 2011-10-08 MED ORDER — DEXTROSE 5 % IV SOLN
30.0000 ug/min | INTRAVENOUS | Status: DC
  Filled 2011-10-08: qty 1

## 2011-10-08 MED ORDER — SODIUM CHLORIDE 0.9 % IV SOLN
INTRAVENOUS | Status: DC | PRN
  Administered 2011-10-08 (×2): via INTRAVENOUS

## 2011-10-08 MED ORDER — LABETALOL HCL 5 MG/ML IV SOLN
10.0000 mg | INTRAVENOUS | Status: DC | PRN
  Filled 2011-10-08: qty 8

## 2011-10-08 MED ORDER — SODIUM CHLORIDE 0.9 % IR SOLN
Status: DC | PRN
  Administered 2011-10-08: 3000 mL

## 2011-10-08 MED ORDER — NICARDIPINE HCL IN NACL 20-0.86 MG/200ML-% IV SOLN
5.0000 mg/h | INTRAVENOUS | Status: DC
  Administered 2011-10-08 (×2): 5 mg/h via INTRAVENOUS
  Filled 2011-10-08 (×3): qty 200

## 2011-10-08 MED ORDER — PANTOPRAZOLE SODIUM 40 MG IV SOLR
40.0000 mg | Freq: Every day | INTRAVENOUS | Status: DC
  Filled 2011-10-08: qty 40

## 2011-10-08 MED ORDER — BACITRACIN ZINC 500 UNIT/GM EX OINT
TOPICAL_OINTMENT | CUTANEOUS | Status: DC | PRN
  Administered 2011-10-08: 1 via TOPICAL

## 2011-10-08 MED ORDER — ONDANSETRON HCL 4 MG/2ML IJ SOLN: 4.0000 mg | Freq: Four times a day (QID) | INTRAMUSCULAR | Status: DC | PRN

## 2011-10-08 MED ORDER — NICARDIPINE HCL IN NACL 20-0.86 MG/200ML-% IV SOLN
5.0000 mg/h | Freq: Once | INTRAVENOUS | Status: AC
  Administered 2011-10-08: 5 mg/h via INTRAVENOUS

## 2011-10-08 MED ORDER — ACETAMINOPHEN 650 MG RE SUPP
650.0000 mg | RECTAL | Status: DC | PRN
  Administered 2011-10-09 – 2011-10-22 (×7): 650 mg via RECTAL
  Filled 2011-10-08 (×12): qty 1

## 2011-10-08 MED ORDER — PROPOFOL 10 MG/ML IV EMUL
5.0000 ug/kg/min | INTRAVENOUS | Status: DC
  Administered 2011-10-09 (×2): 30 ug/kg/min via INTRAVENOUS
  Filled 2011-10-08 (×2): qty 100

## 2011-10-08 MED ORDER — SENNOSIDES-DOCUSATE SODIUM 8.6-50 MG PO TABS
1.0000 | ORAL_TABLET | Freq: Two times a day (BID) | ORAL | Status: DC
  Administered 2011-10-09 – 2011-10-17 (×14): 1 via ORAL
  Filled 2011-10-08 (×19): qty 1

## 2011-10-08 MED ORDER — PROPOFOL 10 MG/ML IV EMUL
INTRAVENOUS | Status: DC | PRN
  Administered 2011-10-08: 25 ug/kg/min via INTRAVENOUS

## 2011-10-08 MED ORDER — PANTOPRAZOLE SODIUM 40 MG IV SOLR: 40.0000 mg | INTRAVENOUS | Status: DC

## 2011-10-08 MED ORDER — FENTANYL CITRATE 0.05 MG/ML IJ SOLN
25.0000 ug | INTRAMUSCULAR | Status: DC | PRN
  Administered 2011-10-09 (×2): 25 ug via INTRAVENOUS
  Administered 2011-10-09 – 2011-10-11 (×3): 50 ug via INTRAVENOUS
  Filled 2011-10-08 (×4): qty 2
  Filled 2011-10-08: qty 4

## 2011-10-08 MED ORDER — ROCURONIUM BROMIDE 100 MG/10ML IV SOLN
INTRAVENOUS | Status: DC | PRN
  Administered 2011-10-08 (×2): 50 mg via INTRAVENOUS

## 2011-10-08 MED ORDER — BUPIVACAINE HCL (PF) 0.25 % IJ SOLN
INTRAMUSCULAR | Status: DC | PRN
  Administered 2011-10-08: 30 mL

## 2011-10-08 MED ORDER — THROMBIN 20000 UNITS EX KIT
PACK | CUTANEOUS | Status: DC | PRN
  Administered 2011-10-08: 20000 [IU] via TOPICAL

## 2011-10-08 MED ORDER — DEXTROSE 5 % IV SOLN
1.0000 g | INTRAVENOUS | Status: DC
  Administered 2011-10-08 – 2011-10-14 (×7): 1 g via INTRAVENOUS
  Filled 2011-10-08 (×8): qty 10

## 2011-10-08 MED ORDER — ACETAMINOPHEN 325 MG PO TABS
650.0000 mg | ORAL_TABLET | ORAL | Status: DC | PRN
  Administered 2011-10-09 – 2011-10-23 (×14): 650 mg via ORAL
  Filled 2011-10-08 (×15): qty 2

## 2011-10-08 MED ORDER — LEVOFLOXACIN IN D5W 750 MG/150ML IV SOLN
750.0000 mg | INTRAVENOUS | Status: DC
  Administered 2011-10-08 – 2011-10-11 (×4): 750 mg via INTRAVENOUS
  Filled 2011-10-08 (×5): qty 150

## 2011-10-08 MED ORDER — SODIUM CHLORIDE 0.9 % IV SOLN
500.0000 mg | INTRAVENOUS | Status: AC
  Administered 2011-10-08: 500 mg via INTRAVENOUS
  Filled 2011-10-08: qty 5

## 2011-10-08 MED ORDER — LIDOCAINE HCL (CARDIAC) 20 MG/ML IV SOLN
INTRAVENOUS | Status: DC | PRN
  Administered 2011-10-08: 100 mg via INTRAVENOUS

## 2011-10-08 SURGICAL SUPPLY — 82 items
APL SKNCLS STERI-STRIP NONHPOA (GAUZE/BANDAGES/DRESSINGS) ×1
BANDAGE GAUZE 4  KLING STR (GAUZE/BANDAGES/DRESSINGS) ×1 IMPLANT
BANDAGE GAUZE ELAST BULKY 4 IN (GAUZE/BANDAGES/DRESSINGS) ×2 IMPLANT
BENZOIN TINCTURE PRP APPL 2/3 (GAUZE/BANDAGES/DRESSINGS) ×1 IMPLANT
BIT DRILL WIRE PASS 1.3MM (BIT) IMPLANT
BRUSH SCRUB EZ 1% IODOPHOR (MISCELLANEOUS) IMPLANT
BRUSH SCRUB EZ PLAIN DRY (MISCELLANEOUS) ×1 IMPLANT
BUR ACORN 6.0 PRECISION (BURR) ×2 IMPLANT
BUR ROUTER D-58 CRANI (BURR) ×2 IMPLANT
CANISTER SUCTION 2500CC (MISCELLANEOUS) ×2 IMPLANT
CATH ROBINSON RED A/P 12FR (CATHETERS) IMPLANT
CLIP RANEY DISP (INSTRUMENTS) ×2 IMPLANT
CLIP TI MEDIUM 6 (CLIP) IMPLANT
CLOTH BEACON ORANGE TIMEOUT ST (SAFETY) ×2 IMPLANT
CONT SPEC 4OZ CLIKSEAL STRL BL (MISCELLANEOUS) ×2 IMPLANT
CORDS BIPOLAR (ELECTRODE) ×2 IMPLANT
DRAIN PENROSE 1/2X12 LTX STRL (WOUND CARE) IMPLANT
DRAIN SNY WOU 7FLT (WOUND CARE) IMPLANT
DRAPE NEUROLOGICAL W/INCISE (DRAPES) ×2 IMPLANT
DRAPE SURG IRRIG POUCH 19X23 (DRAPES) ×1 IMPLANT
DRAPE WARM FLUID 44X44 (DRAPE) ×2 IMPLANT
DRESSING TELFA 8X3 (GAUZE/BANDAGES/DRESSINGS) ×2 IMPLANT
DRILL WIRE PASS 1.3MM (BIT)
DRSG OPSITE 4X5.5 SM (GAUZE/BANDAGES/DRESSINGS) IMPLANT
DRSG PAD ABDOMINAL 8X10 ST (GAUZE/BANDAGES/DRESSINGS) IMPLANT
DURAFORM SPONGE 2X2 SINGLE (Neuro Prosthesis/Implant) ×1 IMPLANT
DURAMATRIX ONLAY 2X2 (Neuro Prosthesis/Implant) ×2 IMPLANT
DURAPREP 6ML APPLICATOR 50/CS (WOUND CARE) ×2 IMPLANT
ELECT CAUTERY BLADE 6.4 (BLADE) ×1 IMPLANT
ELECT REM PT RETURN 9FT ADLT (ELECTROSURGICAL) ×2
ELECTRODE REM PT RTRN 9FT ADLT (ELECTROSURGICAL) ×1 IMPLANT
EVACUATOR SILICONE 100CC (DRAIN) IMPLANT
GAUZE SPONGE 4X4 16PLY XRAY LF (GAUZE/BANDAGES/DRESSINGS) IMPLANT
GLOVE BIO SURGEON STRL SZ8 (GLOVE) ×2 IMPLANT
GLOVE BIOGEL PI IND STRL 7.0 (GLOVE) ×1 IMPLANT
GLOVE BIOGEL PI IND STRL 8 (GLOVE) ×1 IMPLANT
GLOVE BIOGEL PI IND STRL 8.5 (GLOVE) ×1 IMPLANT
GLOVE BIOGEL PI INDICATOR 7.0 (GLOVE) ×1
GLOVE BIOGEL PI INDICATOR 8 (GLOVE) ×1
GLOVE BIOGEL PI INDICATOR 8.5 (GLOVE) ×1
GLOVE ECLIPSE 7.5 STRL STRAW (GLOVE) ×2 IMPLANT
GLOVE EXAM NITRILE LRG STRL (GLOVE) IMPLANT
GLOVE EXAM NITRILE MD LF STRL (GLOVE) IMPLANT
GLOVE EXAM NITRILE XL STR (GLOVE) IMPLANT
GLOVE EXAM NITRILE XS STR PU (GLOVE) IMPLANT
GLOVE SURG SS PI 6.5 STRL IVOR (GLOVE) ×1 IMPLANT
GOWN BRE IMP SLV AUR LG STRL (GOWN DISPOSABLE) ×1 IMPLANT
GOWN BRE IMP SLV AUR XL STRL (GOWN DISPOSABLE) ×1 IMPLANT
GOWN STRL REIN 2XL LVL4 (GOWN DISPOSABLE) IMPLANT
HEMOSTAT SURGICEL 2X14 (HEMOSTASIS) ×2 IMPLANT
KIT BASIN OR (CUSTOM PROCEDURE TRAY) ×2 IMPLANT
KIT ROOM TURNOVER OR (KITS) ×2 IMPLANT
NDL HYPO 25X1 1.5 SAFETY (NEEDLE) ×1 IMPLANT
NEEDLE HYPO 25X1 1.5 SAFETY (NEEDLE) ×2 IMPLANT
NS IRRIG 1000ML POUR BTL (IV SOLUTION) ×2 IMPLANT
PACK CRANIOTOMY (CUSTOM PROCEDURE TRAY) ×2 IMPLANT
PAD ARMBOARD 7.5X6 YLW CONV (MISCELLANEOUS) ×2 IMPLANT
PATTIES SURGICAL .5 X.5 (GAUZE/BANDAGES/DRESSINGS) IMPLANT
PATTIES SURGICAL .5 X3 (DISPOSABLE) IMPLANT
PATTIES SURGICAL 1X1 (DISPOSABLE) IMPLANT
PIN MAYFIELD SKULL DISP (PIN) IMPLANT
PLATE 1.5  2HOLE LNG NEURO (Plate) ×4 IMPLANT
PLATE 1.5 2HOLE LNG NEURO (Plate) ×4 IMPLANT
PLATE 1.5/0.5 13MM BURR HOLE (Plate) ×4 IMPLANT
SCREW SELF DRILL HT 1.5/4MM (Screw) ×19 IMPLANT
SPECIMEN JAR SMALL (MISCELLANEOUS) IMPLANT
SPONGE GAUZE 4X4 12PLY (GAUZE/BANDAGES/DRESSINGS) ×1 IMPLANT
SPONGE NEURO XRAY DETECT 1X3 (DISPOSABLE) IMPLANT
SPONGE SURGIFOAM ABS GEL 12-7 (HEMOSTASIS) IMPLANT
STAPLER SKIN PROX WIDE 3.9 (STAPLE) ×2 IMPLANT
SUT ETHILON 3 0 PS 1 (SUTURE) IMPLANT
SUT NURALON 4 0 TR CR/8 (SUTURE) ×4 IMPLANT
SUT VIC AB 2-0 CP2 18 (SUTURE) ×5 IMPLANT
SUT VIC AB 3-0 SH 8-18 (SUTURE) IMPLANT
SYR 20ML ECCENTRIC (SYRINGE) ×2 IMPLANT
SYR CONTROL 10ML LL (SYRINGE) ×2 IMPLANT
TOWEL OR 17X24 6PK STRL BLUE (TOWEL DISPOSABLE) ×2 IMPLANT
TOWEL OR 17X26 10 PK STRL BLUE (TOWEL DISPOSABLE) ×2 IMPLANT
TRAP SPECIMEN MUCOUS 40CC (MISCELLANEOUS) IMPLANT
TRAY FOLEY CATH 14FRSI W/METER (CATHETERS) IMPLANT
UNDERPAD 30X30 INCONTINENT (UNDERPADS AND DIAPERS) IMPLANT
WATER STERILE IRR 1000ML POUR (IV SOLUTION) ×2 IMPLANT

## 2011-10-08 NOTE — Progress Notes (Signed)
Contacted Dr. Herma Carson with CCM due to patient breathing over the ventilator and seizure activity.  Dr. Herma Carson observed the patient at the bedside.  No new orders given.  Pt was breathing over the ventilator, stacking breaths, and biting the tube.  Propofol was restarted to make the patient more compliant with the ventilator.  Dr. Vickey Huger was also notified of the situation.  She ordered 50 mg Vimpat IV q12 hours to address the seizure activity.  Will continue to monitor and update as needed.  Val Eagle [facility] 10/08/2011 8:46 PM

## 2011-10-08 NOTE — Transfer of Care (Signed)
Immediate Anesthesia Transfer of Care Note  Patient: Gregory Humphrey  Procedure(s) Performed:  CRANIOTOMY HEMATOMA EVACUATION SUBDURAL - Left Craniotomy for subdural  Patient Location: PACU and NICU  Anesthesia Type: General  Level of Consciousness: sedated  Airway & Oxygen Therapy: Patient remains intubated per anesthesia plan  Post-op Assessment: Post -op Vital signs reviewed and stable and Patient moving all extremities X 4  Post vital signs: Reviewed and stable  Complications: No apparent anesthesia complications

## 2011-10-08 NOTE — Progress Notes (Signed)
Name: Gregory Humphrey MRN: [id number] DOB: [birth year]  DOS: 10/07/2011    LOS: 1  CRITICAL CARE ADMISSION NOTE  History of Present Illness: 70 y/o man with h/o HTN brought to [facility] ED unresponsive after complaining of headache earlier.  Intubated for airway protection.  Head CT demonstrated large acute subdural, subarachnoid and parenchimal hemorrhage. Assessed by neurosurgery and deemed not to be a surgical candidate.  Brought to ICU with family expressing wishes for comfort care and DNR.  Lines / Drains: 11/27  ETT>>> 11/27  Foley>>>  Cultures / Sepsis markers: None  Antibiotics: None  Tests / Events: 11/27  Found unresponsive.  Head CT demonstrated large acute subdural, subarachnoid and parenchimal hemorrhage. 11/28- poor neuro exam   Vital Signs:  BP 132/63  Pulse 79  Temp(Src) 101.8 F (38.8 C) (Core (Comment))  Resp 18  Ht 5\' 10"  (1.778 m)  Wt 84.4 kg (186 lb 1.1 oz)  BMI 26.70 kg/m2  SpO2 100% I/O last 3 completed shifts: In: 700 [I.V.:700] Out: 750 [Urine:750]     Physical Examination: Neuro:  Intubated, unresponsive, weak cough and gag, moving some on rue HEENT:  Sluggish unequal pupils, left 3, rt 2 Heart:  RRR, no M/R/G Lungs:  Bilateral air entry, no W/R/R Abdomen:  Soft, non tender, bowel sounds present Extremities: No edema  Labs and Imaging:  Reviewed.  Please refer to the Assessment and Plan section for relevant results.  Labs>  CBC    Component Value Date/Time   WBC 11.6* 10/07/2011 1826   RBC 4.80 10/07/2011 1826   HGB 16.0 10/07/2011 1837   HCT 47.0 10/07/2011 1837   PLT 156 10/07/2011 1826   MCV 90.8 10/07/2011 1826   MCH 30.8 10/07/2011 1826   MCHC 33.9 10/07/2011 1826   RDW 12.8 10/07/2011 1826   LYMPHSABS 1.1 10/07/2011 1826   MONOABS 0.9 10/07/2011 1826   EOSABS 0.1 10/07/2011 1826   BASOSABS 0.0 10/07/2011 1826    BMET    Component Value Date/Time   NA 141 10/07/2011 1837   K 4.0 10/07/2011 1837   CL 100 10/07/2011  1837   CO2 29 10/07/2011 1826   GLUCOSE 159* 10/07/2011 1837   BUN 20 10/07/2011 1837   CREATININE 1.20 10/07/2011 1837   CALCIUM 9.3 10/07/2011 1826   GFRNONAA 82* 10/07/2011 1826   GFRAA >90 10/07/2011 1826    arterial blood gases   Assessment and Plan: Large acute subdural, subarachnoid and parenchimal hemorrhage.  Family is considering comfort measures only.  Presently DNR. However, plan now to Angio brain STAT if aneurysm = comfort If neg then drain SDH -->monitor -->sedation PRN Low threshold place line, do not want to hold up CT Nimodipine if aneurysm and HHH if aggressive coags WNL No free water HOB eleavted  Acute ventilator dependent respiratory failure secondary to inability to protect airway ARF -->full support for now, no SBT  -->postintubation ABG, reduce rate 14 If aggressive would repeat gas in pm pcxr in am   HTN emergency Nicardipine to MAP goals Will need echo if continue support, int prom on pcxr Tele Al ine if aggreesive to OR cvp if aggressive to OR  malnurished ppi Npo lft in am   Best practices / Disposition: -->ICU status under PCCM, Neurosurgery consulted -->DNR -->SCDs for DVT Px -->Protonix for GI Px -->ventilator bundle -->NPO -->family updated at bedside  The patient is critically ill with multiple organ systems failure and requires high complexity decision making for assessment and support, frequent evaluation and  titration of therapies, application of advanced monitoring technologies and extensive interpretation of multiple databases. Critical Care Time devoted to patient care services described in this note is 35 minutes.  Mcarthur Rossetti. Tyson Alias, MD, FACP Pgr: [phone] [facility]  Cell: [phone]  10/08/2011, 10:29 AM

## 2011-10-08 NOTE — Progress Notes (Signed)
eLink Physician-Brief Progress Note Patient Name: Gregory Humphrey DOB: 1941-05-02 MRN: [id number]  Date of Service  10/08/2011   HPI/Events of Note  Back from OR - hypotensive on neo gtt Propofol   eICU Interventions  Orders given   Intervention Category Major Interventions: Hypotension - evaluation and management  Erial Fikes V. 10/08/2011, 6:30 PM

## 2011-10-08 NOTE — Anesthesia Preprocedure Evaluation (Addendum)
Anesthesia Evaluation   Patient unresponsive    Reviewed: Unable to perform ROS - Chart review only  Airway       Dental   Pulmonary  Intubated on 11/27 with large subdural hematoma. Remains so today. Family unavailable for questions.         Cardiovascular hypertension, Pt. on medications     Neuro/Psych Pt intubated not sedated on Cardene gtt Emergent surgery     GI/Hepatic   Endo/Other    Renal/GU      Musculoskeletal   Abdominal   Peds  Hematology   Anesthesia Other Findings   Reproductive/Obstetrics                         Anesthesia Physical Anesthesia Plan  ASA: IV  Anesthesia Plan: General   Post-op Pain Management:    Induction: Intravenous  Airway Management Planned: Oral ETT  Additional Equipment: Arterial line  Intra-op Plan:   Post-operative Plan: Post-operative intubation/ventilation  Informed Consent:   History available from chart only  Plan Discussed with: CRNA, Surgeon and Anesthesiologist  Anesthesia Plan Comments:         Anesthesia Quick Evaluation

## 2011-10-08 NOTE — Anesthesia Postprocedure Evaluation (Signed)
  Anesthesia Post-op Note  Patient: Gregory Humphrey  Procedure(s) Performed:  CRANIOTOMY HEMATOMA EVACUATION SUBDURAL - Left Craniotomy for subdural  Patient Location: PACU and NICU  Anesthesia Type: General  Level of Consciousness: sedated  Airway and Oxygen Therapy: Patient remains intubated per anesthesia plan  Post-op Pain: Patient sedated and intubated  Post-op Assessment: Post-op Vital signs reviewed and Patient's Cardiovascular Status Stable  Post-op Vital Signs: Reviewed and stable  Complications: No apparent anesthesia complications

## 2011-10-08 NOTE — Transfer of Care (Signed)
Immediate Anesthesia Transfer of Care Note  Patient: Gregory Humphrey  Procedure(s) Performed:  CRANIOTOMY HEMATOMA EVACUATION SUBDURAL - Left Craniotomy for subdural  Patient Location: PACU and ICU  Anesthesia Type: General  Level of Consciousness: comatose and Patient remains intubated per anesthesia plan  Airway & Oxygen Therapy: Patient remains intubated per anesthesia plan and Patient placed on Ventilator (see vital sign flow sheet for setting)  Post-op Assessment: Report given to PACU RN and Post -op Vital signs reviewed and stable  Post vital signs: Reviewed and stable  Complications: No apparent anesthesia complications

## 2011-10-08 NOTE — Progress Notes (Addendum)
Stroke Team Progress Note  SUBJECTIVE  Gregory Humphrey is a 70 y.o. male whose stroke event occurred 1 day ago. He presented with symptoms of headache while laying on the couch at 4p, called for help at 10p. No mention of seizure. Patients mother with history of cerebral hemorrhage, ? Aneurysmal.      Wife and sister at bedside. The patient has had a week history of a cold with coughing, sometimes extreme.No prior h/o headaches and aneurysm.   OBJECTIVE Most recent Vital Signs: Temp: 101.8 F (38.8 C) (11/28 0900) Temp src: Core (Comment) (11/27 2249) BP: 132/63 mmHg (11/28 0907) Pulse Rate: 79  (11/28 0900) Respiratory Rate: 18 O2 Saturdation: 100%  CBG (last 3)   Basename 10/07/11 2221 10/07/11 1833  GLUCAP 173* 154*   Intake/Output from previous day: 11/27 0701 - 11/28 0700 In: 700 [I.V.:700] Out: 750 [Urine:750]  IV Fluid Intake:      . sodium chloride 100 mL/hr at 10/08/11 0700  . niCARDipine 5 mg/hr (10/08/11 0823)   Diet:  NPO   Activity:  Bedrest  DVT Prophylaxis:  SCDs   Studies: CBC    Component Value Date/Time   WBC 11.6* 10/07/2011 1826   RBC 4.80 10/07/2011 1826   HGB 16.0 10/07/2011 1837   HCT 47.0 10/07/2011 1837   PLT 156 10/07/2011 1826   MCV 90.8 10/07/2011 1826   MCH 30.8 10/07/2011 1826   MCHC 33.9 10/07/2011 1826   RDW 12.8 10/07/2011 1826   LYMPHSABS 1.1 10/07/2011 1826   MONOABS 0.9 10/07/2011 1826   EOSABS 0.1 10/07/2011 1826   BASOSABS 0.0 10/07/2011 1826   CMP    Component Value Date/Time   NA 141 10/07/2011 1837   K 4.0 10/07/2011 1837   CL 100 10/07/2011 1837   CO2 29 10/07/2011 1826   GLUCOSE 159* 10/07/2011 1837   BUN 20 10/07/2011 1837   CREATININE 1.20 10/07/2011 1837   CALCIUM 9.3 10/07/2011 1826   PROT 7.8 10/07/2011 1826   ALBUMIN 4.2 10/07/2011 1826   AST 34 10/07/2011 1826   ALT 42 10/07/2011 1826   ALKPHOS 59 10/07/2011 1826   BILITOT 0.8 10/07/2011 1826   GFRNONAA 82* 10/07/2011 1826   GFRAA >90 10/07/2011  1826   COAGS Lab Results  Component Value Date   INR 1.02 10/07/2011   Lipid Panel    Component Value Date/Time   CHOL 113 09/08/2011 0932   TRIG 103.0 09/08/2011 0932   HDL 30.10* 09/08/2011 0932   CHOLHDL 4 09/08/2011 0932   VLDL 20.6 09/08/2011 0932   LDLCALC 62 09/08/2011 0932   HgbA1C  Lab Results  Component Value Date   HGBA1C 5.1 12/04/2005   Urine Drug Screen  No results found for this basename: labopia,  cocainscrnur,  labbenz,  amphetmu,  thcu,  labbarb    Alcohol Level No results found for this basename: eth   CT of the brain  10/07/11 Large, acute subdural hemorrhage over the left convexity with significant mass effect and evidence of subfalcine and uncal herniation.  Associated subarachnoid blood and also small parenchymal hemorrhage in the inferior right frontal lobe.  Foci of deep white matter infarcts noted likely remote age.   CT of the brain  10/08/11  1.  Interval improvement in size of large left subdural hematoma, overlying the left cerebral hemisphere.  It now measures 1.6 cm in maximal thickness, decreased from 2.2 cm on the prior study.  The degree of rightward midline shift has also improved, now  measuring 1.2 cm, compared to 1.6 cm on the prior study.  This may reflect some degree of resorption of the hematoma. 2.  Small areas of white matter infarct along the left anterior corona radiata are essentially unchanged in distribution, though slightly less apparent on the current study.  No evidence of additional infarct at this time. 3.  Stable appearance to the small right frontal intraparenchymal bleed, subdural blood along the left tentorium cerebelli, subarachnoid blood along the left cerebral hemisphere and partial effacement of the left lateral ventricle and third ventricle. 4.  Partial opacification of both maxillary sinuses and near- complete opacification of the ethmoid air cells.    CT angio  Not done  MRI of the brain  Not done  MRA of the brain   Not done  2D Echocardiogram  Not ordered  Carotid Doppler  Not ordered  CXR  1.  The endotracheal tube is 3 cm above the carina. 2.  Low lung volumes with vascular crowding and atelectasis.  EKG  Not done. TELE shows NSR.   Physical Exam   sedated intubated on the ventilator.head is nontraumatic. Neck is supple without bruit. Current exam no murmur or gallop. Lungs clear to auscultation.  Neurological exam stuporose.does not open eyes to touch of pain. Pupils are 3 mm sluggishly reactive. Corneal reflexes are brisk. Doll's eye movements are present.Eyes are  in primary position. Has no facial weakness.Tongue is midline. Does not follow commands. Withdraws right upper extremity and lower extremity semi-purposefully to sternal rub. Minimum withdrawal of the left lower extremity to pain. No response of the left upper extremity. Both plantars are upgoing. ASSESSMENT [name] is a 70 y.o. male with a SDH, etiiology unknown. There is some blood in the basal cisterns and a right medial frontal lobe which may be secondary. Aneurysmal primary subarachnoid hemorrhage would be less likely.Given slight improvement in patient's neurological exam and CT scan findings the acute subdural may need draining.Urgent CT angiogram is  needed to determine if aneurysm present. Hypertension, on cardene drip, controlled.  Stroke risk factors:  family history and hypertension  Hospital day # 1  TREATMENT/PLAN Dr. Pearlean Brownie discussed with Dr. Venetia Maxon. Stat CT angio to determine if aneurysm is present. If it is, will address possible comfort care with family. If no aneurysm, likely OR by Dr. Venetia Maxon around 12n to remove subdural clot. Dr. Pearlean Brownie discussed dx, treatment and plan with wife and sister, Dr Tyson Alias and Venetia Maxon..I explained clearly to the patient's wife the purpose of surgery may be lifesaving but his prognosis is still not likely to  be good given brain damage and significant hemiparesis. This patient is  critically ill and at significant risk of neurological worsening, death and care requires constant monitoring of vital signs, hemodynamics,respiratory and cardiac monitoring, neurological assessment, discussion with family, other specialists and medical decision making of high complexity. I spent 30 minutes of neurocritical care time  in the care of  this patient.   [name], ANP-BC, GNP-BC [facility] Pager: [phone] 10/08/2011 10:08 AM  Dr. Delia Heady, Stroke Center Medical Director, has personally reviewed chart, pertinent data, examined the patient and developed the plan of care.

## 2011-10-08 NOTE — Progress Notes (Signed)
Pt brought straight from OR and was placed on the vent with previous settings. Previous vent settings were PRVC, VT 620, RR 14, PEEP+5, FiO2 40%. The pt still has a # 8.0 ETT with cuff inflated to 26cmh2o and is secured with an ETT holder. The pt also has a right arterial line already in place and is secure. No complications noted. RT will monitor.

## 2011-10-08 NOTE — Progress Notes (Addendum)
Subjective: Patient reports unresponsive.  On vent  Objective: Vital signs in last 24 hours: Temp:  [100.8 F (38.2 C)-102.2 F (39 C)] 101.7 F (38.7 C) (11/28 0700) Pulse Rate:  [72-102] 80  (11/28 0700) Resp:  [14-19] 18  (11/28 0700) BP: (120-177)/(62-103) 127/68 mmHg (11/28 0700) SpO2:  [100 %] 100 % (11/28 0700) FiO2 (%):  [50 %-100 %] 50 % (11/28 0400) Weight:  [84.4 kg (186 lb 1.1 oz)] 186 lb 1.1 oz (84.4 kg) (11/27 2249)  Intake/Output from previous day: 11/27 0701 - 11/28 0700 In: 700 [I.V.:700] Out: 750 [Urine:750] Intake/Output this shift:    Physical Exam: Unresponsive.  On vent.  No eye opening. Pupils asymetric, min reactive. No Dolls.  Weak corneals. Flexor withdrawal r> l upper extremities.  Weal withdrawal bilateral lower extremities.  Lab Results:  Basename 10/07/11 1837 10/07/11 1826  WBC -- 11.6*  HGB 16.0 14.8  HCT 47.0 43.6  PLT -- 156   BMET  Basename 10/07/11 1837 10/07/11 1826  NA 141 140  K 4.0 4.1  CL 100 100  CO2 -- 29  GLUCOSE 159* 158*  BUN 20 17  CREATININE 1.20 0.97  CALCIUM -- 9.3    Studies/Results: Ct Head Wo Contrast  10/08/2011  *RADIOLOGY REPORT*  Clinical Data: Follow-up intracranial hemorrhage; fever.  CT HEAD WITHOUT CONTRAST  Technique:  Contiguous axial images were obtained from the base of the skull through the vertex without contrast.  Comparison: CT of the head performed 10/07/2011  Findings:   A large left subdural hematoma is again noted, overlying the left cerebral hemisphere, measuring 1.6 cm in maximal thickness.  This appears to have decreased mildly in size from the prior study, possibly reflecting mild resorption.  The degree of rightward midline shift has also improved, now measuring 1.2 cm.  A small intraparenchymal bleed within the right frontal lobe is not significantly changed in appearance.  A small amount of subdural blood along the left tentorium cerebelli also appears relatively stable.  Subarachnoid  blood along the left cerebral hemisphere is essentially unchanged.  There is persistent partial effacement of the left lateral ventricle and third ventricle.  Small areas of white matter infarct along the left anterior corona radiata are essentially unchanged in distribution, though slightly less apparent.  No definite additional evolving infarct is characterized at this time.  Some degree of subfalcine herniation is again seen; no definite transtentorial herniation is evident.  There is no evidence of fracture; visualized osseous structures are unremarkable in appearance.  The visualized portions of the orbits are within normal limits. There is partial opacification of both maxillary sinuses and near-complete opacification of the ethmoid air cells.  The remaining paranasal sinuses and mastoid air cells are well-aerated.  No significant soft tissue abnormalities are seen.  IMPRESSION:  1.  Interval improvement in size of large left subdural hematoma, overlying the left cerebral hemisphere.  It now measures 1.6 cm in maximal thickness, decreased from 2.2 cm on the prior study.  The degree of rightward midline shift has also improved, now measuring 1.2 cm, compared to 1.6 cm on the prior study.  This may reflect some degree of resorption of the hematoma. 2.  Small areas of white matter infarct along the left anterior corona radiata are essentially unchanged in distribution, though slightly less apparent on the current study.  No evidence of additional infarct at this time. 3.  Stable appearance to the small right frontal intraparenchymal bleed, subdural blood along the left tentorium cerebelli, subarachnoid  blood along the left cerebral hemisphere and partial effacement of the left lateral ventricle and third ventricle. 4.  Partial opacification of both maxillary sinuses and near- complete opacification of the ethmoid air cells.  Findings were discussed with Landry Corporal RN on [id number] at 06:46 a.m. on 10/08/2011.   Original Report Authenticated By: Tonia Ghent, M.D.   Ct Head Wo Contrast  10/07/2011  *RADIOLOGY REPORT*  Clinical Data: Unresponsive with blown pupil.  Complaint of headache earlier today.  CT HEAD WITHOUT CONTRAST  Technique:  Contiguous axial images were obtained from the base of the skull through the vertex without contrast.  Comparison: None.  Findings: There is a large acute left sided subdural hemorrhage overlying the convexity measuring just over 2 cm in greatest thickness.  This is also associated with some adjacent communicating subarachnoid blood and significant mass effect. There is upwards of 16 mm of right subfalcine shift.  Basal cisterns are also partially obliterated due to mass effect.  There is some subarachnoid blood at the level of the basal cisterns. Focus of acute hemorrhage is also present in the right inferior frontal lobe measuring approximately 1 cm.  Cerebral edema present throughout the brain.  There are two discrete foci of deep white matter infarcts just superior to the left basal ganglia in the region of the corona radiata.  No evidence of skull fracture.  Mucosal thickening present in bilateral maxillary antra and ethmoid air cells.  IMPRESSION: Large, acute subdural hemorrhage over the left convexity with significant mass effect and evidence of subfalcine and uncal herniation.  Associated subarachnoid blood and also parenchymal hemorrhage in the inferior right frontal lobe.  Foci of deep white matter infarcts noted.  Critical Value/emergent results were called by telephone at the time of interpretation on 10/07/2011  at 1945 hours  to  Dr. Deretha Emory, who verbally acknowledged these results.  Findings also reviewed with Dr. Lyman Speller from Neurology.  Original Report Authenticated By: Reola Calkins, M.D.   Dg Chest Portable 1 View  10/07/2011  *RADIOLOGY REPORT*  Clinical Data: Intubation.  PORTABLE CHEST - 1 VIEW  Comparison: None  Findings: The endotracheal tube is 3 cm  above the carina.  The heart is normal in size.  There is tortuosity and ectasia of the thoracic aorta.  Low lung volumes with vascular crowding and atelectasis but no edema or effusions.  The bony thorax is intact.  IMPRESSION:  1.  The endotracheal tube is 3 cm above the carina. 2.  Low lung volumes with vascular crowding and atelectasis.  Original Report Authenticated By: P. Loralie Champagne, M.D.    Assessment/Plan: Patient with slight interval improvement in exam, but essentially stable.  Prognosis remains dismal.  Continue supportive care per family wishes.  Per later discussion with Dr. Pearlean Brownie, in light of slight improvement in clinical exam and CT findings, and per his discussion with family of patient, they wish to proceed with surgery to evacuate SDH.  CTA obtained prior to surgery negative for aneurysm.  Plan is craniotomy for SDH.    LOS: 1 day    Dorian Heckle, MD 10/08/2011, 8:40 AM

## 2011-10-08 NOTE — Progress Notes (Signed)
RT attempted to obtain sputum specimen, however there was no return when the pt was suctioned. RT will re-attempt later this evening. RN notified. RT will monitor.

## 2011-10-08 NOTE — Anesthesia Postprocedure Evaluation (Signed)
  Anesthesia Post-op Note  Patient: Gregory Humphrey  Procedure(s) Performed:  CRANIOTOMY HEMATOMA EVACUATION SUBDURAL - Left Craniotomy for subdural  Patient Location: PACU and ICU  Anesthesia Type: General  Level of Consciousness: comatose and Patient remains intubated per anesthesia plan  Airway and Oxygen Therapy: Patient remains intubated per anesthesia plan  Post-op Pain: none  Post-op Assessment: Post-op Vital signs reviewed, Patient's Cardiovascular Status Stable, Respiratory Function Stable, Patent Airway, No signs of Nausea or vomiting and Pain level controlled  Post-op Vital Signs: Reviewed and stable  Complications: No apparent anesthesia complications

## 2011-10-08 NOTE — Progress Notes (Signed)
UR Completed.  Robbert Langlinais Jane [phone] 10/08/2011  

## 2011-10-08 NOTE — Op Note (Signed)
10/07/2011 - 10/08/2011  3:53 PM  PATIENT:  Gregory Humphrey  70 y.o. male  PRE-OPERATIVE DIAGNOSIS:  Spontaneous Hemorrhage  POST-OPERATIVE DIAGNOSIS:  Spontaneous Hemorrhage  PROCEDURE:  Procedure(s): CRANIOTOMY HEMATOMA EVACUATION SUBDURAL  SURGEON:  Surgeon(s): Dorian Heckle, MD  PHYSICIAN ASSISTANT:   ASSISTANTS: Poteat, RN   ANESTHESIA:   general  EBL:  Total I/O In: 2400 [I.V.:2250; IV Piggyback:150] Out: 400 [Urine:200; Blood:200]  BLOOD ADMINISTERED:none  DRAINS: none   LOCAL MEDICATIONS USED:  LIDOCAINE 30CC  SPECIMEN:  No Specimen  DISPOSITION OF SPECIMEN:  N/A  COUNTS:  YES  TOURNIQUET:  * No tourniquets in log *  DICTATION: Patient was brought to the operating room. Arterial line was placed by anesthesia. He was placed on a left-sided blanket roll and the right side of his head on a doughnut head holder. Left frontotemporal parietal scalp was shaved then prepped and draped in usual sterile fashion. Area of planned incision was infiltrated with local lidocaine. Large scalp flap was incised and elevated Raney clips were applied. The flap was then brought forward along with the temporalis muscle and fascia. Large bone flap was elevated with bur holes and the craniotome. A large subdural hematoma was evacuated. There was a clot overlying a vascular anomaly which appeared to be consistent with a small arteriovenous malformation. This was coagulated and bleeding stopped. There appeared to be pulsatile arterial bleeding this lesion prior to stopping it with bipolar electrocautery. The remaining subdural hematoma was then removed and the anatomy defect was irrigated and additional clotted blood was removed. The dura was then placed with Nurolon stitches and dural graft substitute was also utilized. The bone flap was then replaced with plates and the galea was then reapproximated with 2-0 Vicryl sutures. The temporalis muscle fascia was reapproximated 2-0 Vicryl sutures.  The skin edges were reapproximated with staples. A sterile occlusive dressing was applied. Patient was taken to recovery still intubated being tolerated the procedure without difficulty. Counts were correct at the end of the case.  PLAN OF CARE: Admit to inpatient   PATIENT DISPOSITION:  PACU - hemodynamically stable.   Delay start of Pharmacological VTE agent (>24hrs) due to surgical blood loss or risk of bleeding:  YES

## 2011-10-08 NOTE — Preoperative (Signed)
Beta Blockers   Reason not to administer Beta Blockers:Not Applicable 

## 2011-10-09 ENCOUNTER — Inpatient Hospital Stay (HOSPITAL_COMMUNITY): Payer: Medicare Other

## 2011-10-09 DIAGNOSIS — R4182 Altered mental status, unspecified: Secondary | ICD-10-CM

## 2011-10-09 DIAGNOSIS — J9600 Acute respiratory failure, unspecified whether with hypoxia or hypercapnia: Secondary | ICD-10-CM

## 2011-10-09 DIAGNOSIS — I629 Nontraumatic intracranial hemorrhage, unspecified: Secondary | ICD-10-CM

## 2011-10-09 LAB — BLOOD GAS, ARTERIAL
Drawn by: 352351
FIO2: 0.4 %
MECHVT: 620 mL
RATE: 14 resp/min
TCO2: 23.8 mmol/L (ref 0–100)
pCO2 arterial: 34.8 mmHg — ABNORMAL LOW (ref 35.0–45.0)
pH, Arterial: 7.431 (ref 7.350–7.450)
pO2, Arterial: 135 mmHg — ABNORMAL HIGH (ref 80.0–100.0)

## 2011-10-09 LAB — COMPREHENSIVE METABOLIC PANEL
ALT: 30 U/L (ref 0–53)
Albumin: 2.7 g/dL — ABNORMAL LOW (ref 3.5–5.2)
Alkaline Phosphatase: 46 U/L (ref 39–117)
Chloride: 107 mEq/L (ref 96–112)
Potassium: 3.8 mEq/L (ref 3.5–5.1)
Sodium: 139 mEq/L (ref 135–145)
Total Bilirubin: 1.2 mg/dL (ref 0.3–1.2)
Total Protein: 6 g/dL (ref 6.0–8.3)

## 2011-10-09 LAB — GLUCOSE, CAPILLARY
Glucose-Capillary: 152 mg/dL — ABNORMAL HIGH (ref 70–99)
Glucose-Capillary: 130 mg/dL — ABNORMAL HIGH (ref 70–99)

## 2011-10-09 MED ORDER — OSMOLITE 1.2 CAL PO LIQD
1000.0000 mL | ORAL | Status: DC
  Filled 2011-10-09 (×2): qty 1000

## 2011-10-09 MED ORDER — SODIUM CHLORIDE 0.9 % IV SOLN
750.0000 mg | Freq: Two times a day (BID) | INTRAVENOUS | Status: DC
  Administered 2011-10-09 – 2011-10-18 (×18): 750 mg via INTRAVENOUS
  Filled 2011-10-09 (×19): qty 7.5

## 2011-10-09 MED ORDER — PROMOTE PO LIQD
1000.0000 mL | ORAL | Status: AC
  Administered 2011-10-09 – 2011-10-15 (×3): 1000 mL
  Filled 2011-10-09 (×16): qty 1000

## 2011-10-09 MED ORDER — INSULIN ASPART 100 UNIT/ML ~~LOC~~ SOLN
0.0000 [IU] | SUBCUTANEOUS | Status: DC
  Administered 2011-10-09: 1 [IU] via SUBCUTANEOUS
  Administered 2011-10-09: 2 [IU] via SUBCUTANEOUS
  Administered 2011-10-09 – 2011-10-10 (×2): 1 [IU] via SUBCUTANEOUS
  Administered 2011-10-10: 2 [IU] via SUBCUTANEOUS
  Administered 2011-10-10: 1 [IU] via SUBCUTANEOUS
  Administered 2011-10-10 – 2011-10-11 (×3): 2 [IU] via SUBCUTANEOUS
  Administered 2011-10-11 (×2): 1 [IU] via SUBCUTANEOUS
  Administered 2011-10-11 (×3): 2 [IU] via SUBCUTANEOUS
  Administered 2011-10-12: 1 [IU] via SUBCUTANEOUS
  Administered 2011-10-12 (×2): 2 [IU] via SUBCUTANEOUS
  Administered 2011-10-12 (×2): 1 [IU] via SUBCUTANEOUS
  Administered 2011-10-12: 2 [IU] via SUBCUTANEOUS
  Administered 2011-10-12: 1 [IU] via SUBCUTANEOUS
  Administered 2011-10-13 (×2): 2 [IU] via SUBCUTANEOUS
  Administered 2011-10-13: 1 [IU] via SUBCUTANEOUS
  Administered 2011-10-13 (×2): 2 [IU] via SUBCUTANEOUS
  Administered 2011-10-14 – 2011-10-17 (×18): 1 [IU] via SUBCUTANEOUS
  Administered 2011-10-17: 2 [IU] via SUBCUTANEOUS
  Administered 2011-10-18: 1 [IU] via SUBCUTANEOUS
  Administered 2011-10-18: 2 [IU] via SUBCUTANEOUS
  Administered 2011-10-18: 1 [IU] via SUBCUTANEOUS
  Administered 2011-10-18 (×2): 2 [IU] via SUBCUTANEOUS
  Administered 2011-10-18: 1 [IU] via SUBCUTANEOUS
  Administered 2011-10-19 (×2): 2 [IU] via SUBCUTANEOUS
  Administered 2011-10-19 (×2): 1 [IU] via SUBCUTANEOUS
  Administered 2011-10-19 – 2011-10-20 (×4): 2 [IU] via SUBCUTANEOUS
  Administered 2011-10-20: 3 [IU] via SUBCUTANEOUS
  Administered 2011-10-20: 2 [IU] via SUBCUTANEOUS
  Administered 2011-10-20: 1 [IU] via SUBCUTANEOUS
  Administered 2011-10-20 – 2011-10-21 (×3): 2 [IU] via SUBCUTANEOUS
  Administered 2011-10-21: 3 [IU] via SUBCUTANEOUS
  Administered 2011-10-21 – 2011-10-24 (×19): 2 [IU] via SUBCUTANEOUS
  Filled 2011-10-09 (×2): qty 3

## 2011-10-09 MED ORDER — NICARDIPINE HCL IN NACL 20-0.86 MG/200ML-% IV SOLN
5.0000 mg/h | INTRAVENOUS | Status: DC
  Administered 2011-10-09: 15 mg/h via INTRAVENOUS
  Administered 2011-10-09: 5 mg/h via INTRAVENOUS
  Filled 2011-10-09 (×2): qty 200

## 2011-10-09 MED FILL — Sodium Chloride Irrigation Soln 0.9%: Qty: 500 | Status: AC

## 2011-10-09 NOTE — Progress Notes (Signed)
INITIAL ADULT NUTRITION ASSESSMENT Date: 10/09/2011   Time: 2:51 PM  Reason for Assessment: TF Consult  ASSESSMENT: Male 70 y.o.  Dx: ICH (intracerebral hemorrhage), VDRF  Hx:  Past Medical History  Diagnosis Date  . Hypertension     Related Meds:     . antiseptic oral rinse  1 application Mouth Rinse QID  . cefTRIAXone (ROCEPHIN)  IV  1 g Intravenous Q24H  . chlorhexidine  15 mL Mouth/Throat BID  . insulin aspart  0-9 Units Subcutaneous Q4H  . levetiracetam  500 mg Intravenous To NPACU  . levetiracetam  750 mg Intravenous Q12H  . levofloxacin (LEVAQUIN) IV  750 mg Intravenous Q24H  . niCARDipine  5 mg/hr Intravenous Once  . pantoprazole (PROTONIX) IV  40 mg Intravenous Q24H  . senna-docusate  1 tablet Oral BID  . DISCONTD: lacosamide (VIMPAT) IV  50 mg Intravenous Q12H  . DISCONTD: levetiracetam  500 mg Intravenous Q12H  . DISCONTD: pantoprazole (PROTONIX) IV  40 mg Intravenous QHS     Ht: 5\' 10"  (177.8 cm)  Wt: 186 lb 1.1 oz (84.4 kg)  Ideal Wt: 75.4 kg % Ideal Wt: 112%  Usual Wt: unable to obtain % Usual Wt: ---  Body mass index is 26.70 kg/(m^2).  Food/Nutrition Related Hx: dysphagia per nutrition screen  Labs:  CMP     Component Value Date/Time   NA 139 10/09/2011 0510   K 3.8 10/09/2011 0510   CL 107 10/09/2011 0510   CO2 23 10/09/2011 0510   GLUCOSE 156* 10/09/2011 0510   BUN 12 10/09/2011 0510   CREATININE 0.76 10/09/2011 0510   CALCIUM 8.1* 10/09/2011 0510   PROT 6.0 10/09/2011 0510   ALBUMIN 2.7* 10/09/2011 0510   AST 34 10/09/2011 0510   ALT 30 10/09/2011 0510   ALKPHOS 46 10/09/2011 0510   BILITOT 1.2 10/09/2011 0510   GFRNONAA >90 10/09/2011 0510   GFRAA >90 10/09/2011 0510    I/O last 3 completed shifts: In: 6119.7 [I.V.:5724.7; IV Piggyback:395] Out: 2330 [Urine:2130; Blood:200] Total I/O In: 600 [I.V.:400; IV Piggyback:200] Out: 250 [Urine:250]   Diet Order: NPO  Supplements/Tube Feeding: Osmolite 1.2 formula @ 20  ml/hr, advance 10 ml every 4 hours until goal rate of 40 ml/hr (MD order per Adult Enteral Nutrition Protocol)  IVF: NS @ 100 ml/hr  Estimated Nutritional Needs:   Kcal: 2,100-2,200 Protein: 115-125 gms Fluid: 2.0-2.1 L  RD unable to obtain nutrition hx from pt --- intubated & sedated; no access for EN at this time -- OGT placement pending; RD spoke with RN; s/p craniotomy 11/28; Propofol infusion off since this AM; RD to adjust EN regimen accordingly  NUTRITION DIAGNOSIS: -Inadequate oral intake (NI-2.1).  Status: Ongoing  RELATED TO: inability to eat  AS EVIDENCE BY: NPO status  MONITORING/EVALUATION(Goals): Goal: EN to meet >90% of estimated nutrition needs Monitor: EN tolerance, weight, labs, skin integrity  EDUCATION NEEDS: -Education not appropriate at this time  INTERVENTION:  Once OGT placed, initiate Promote formula at 25 ml/hr, advance 10 ml every 4 hours until goal rate of 85 ml/hr reached to provide 2,040 kcals, 127 gms protein, 1712 ml of free water  RD to follow for nutrition care plan  Dietitian #: [phone]  DOCUMENTATION CODES Per approved criteria  -Not Applicable    Kirkland Hun, RD, LDN 10/09/2011, 2:51 PM

## 2011-10-09 NOTE — Clinical Documentation Improvement (Signed)
GENERIC DOCUMENTATION CLARIFICATION QUERY  THIS DOCUMENT IS NOT A PERMANENT PART OF THE MEDICAL RECORD  Please update your documentation within the medical record to reflect your response to this query.                                                                                        10/09/11   Dear Dr.Geanie Pacifico / Associates,  In a better effort to capture your patient's severity of illness, reflect appropriate length of stay and utilization of resources, a review of the patient medical record has revealed the following indicators.   Based on your clinical judgment, please clarify and document in a progress note and/or discharge summary the clinical condition associated with the following supporting information: In responding to this query please exercise your independent judgment.  The fact that a query is asked, does not imply that any particular answer is desired or expected.  TO RESPOND TO THE THIS QUERY, FOLLOW THE INSTRUCTIONS BELOW:  1. If needed, update documentation for the patient's encounter via the notes activity.  2. Access this query again and click edit on the [employer].  3. After updating, or not, click F2 to complete all highlighted (required) fields concerning your review. Select "additional documentation in the medical record" OR "no additional documentation provided".  4. Click Sign note button.  5. The deficiency will fall out of your InBasket *Please let us know if you are not able to compete this workflow by phone or e-mail (listed below).    "Head CT shows SAH and left SDH with 1.4cm left to right shift with subfalcine herniation" is documented in the consult note of 11/27. Diagnoses can only be taken from a direct care provider/physician. Diagnoses can NOT be taken from a diagnostic/lab report. Please consider clarifying your documentation as a problem/diagnosis/condition in the progress note and discharge summary.  Possible Clinical Conditions?                                                         **"resolved" - may be added when appropriate  - Subfalcine herniation  - Subfalcine and uncal herniation  - Subfalcine herniation with cerebral edema  - Subfalcine and uncal herniation with cerebral edema  - Other condition (please document in the progress notes and/or discharge summary)  - Cannot Clinically determine at this time                                                                                                   Supporting Information:  - "[facility] and SDH)" consult note  11/27  -  ED physician assessment: "unresponsive", "pupil asymmetry and minimally reactive", "agonal respirations", "Extensor posturing of BLE", "Flexor posturing of BUE", and "intubated on arrival"  - CT head 11/27: "Cerebral edema present throughout the brain." and "...significant mass effect and evidence of subfalcine and uncal Herniation."  - 11/28 CRANIOTOMY HEMATOMA EVACUATION SUBDURAL   **You may use possible, probable, or suspect with inpatient documentation. possible, probable, suspected diagnoses MUST be documented at the time of discharge   Reviewed: additional documentation in the medical record  Thank You,  Eldred Manges  Clinical Documentation Specialist Health Information Management [place] Email: [email or web address]

## 2011-10-09 NOTE — Progress Notes (Signed)
Speech Language/Pathology  Pt sedated on vent, received orders for SLP eval and treat.  Please reorder when pt is appropriate.  Will sign off at this time. Harlon Ditty, MA CCC-SLP [phone]

## 2011-10-09 NOTE — Progress Notes (Signed)
Clinical Social Worker received referral for pt for psychosocial support.  CSW went to room pt was in a procedure.  CSW phoned pt's contact, Fleet Contras, and left a voicemail introducing self, explaining role, and offering support.  Psychosocial Assessment not able to be completed at this time.  CSW to continue to follow and assist as needed.  Angelia Mould, MSW, [place] [phone]

## 2011-10-09 NOTE — Progress Notes (Signed)
PT Cancellation Note  Evaluation cancelled today due to medical issues with patient which prohibited therapy: patient is sedated and on vent. Will check back 11/30 for any medical changes.  10/09/2011 Edwyna Perfect, PT  Pager [phone]

## 2011-10-09 NOTE — Progress Notes (Signed)
Patient ID: Gregory Humphrey, male   DOB: 07/01/1941, 70 y.o.   MRN: [id number] Subjective: Patient reports intubated, unresponsive  Objective: Vital signs in last 24 hours: Temp:  [97 F (36.1 C)-102 F (38.9 C)] 101.1 F (38.4 C) (11/29 0700) Pulse Rate:  [65-102] 81  (11/29 0742) Resp:  [14-23] 23  (11/29 0700) BP: (90-177)/(53-85) 146/68 mmHg (11/29 0742) SpO2:  [98 %-100 %] 100 % (11/29 0742) FiO2 (%):  [39.4 %-40.5 %] 40 % (11/29 0742)  Intake/Output from previous day: 11/28 0701 - 11/29 0700 In: 5369.7 [I.V.:4974.7; IV Piggyback:395] Out: 1580 [Urine:1380; Blood:200] Intake/Output this shift:    Physical Exam: Pupils reactive, upper withdraws, lowers flicker (on propofol)  Lab Results:  [facility] 10/07/11 1837 10/07/11 1826  WBC -- 11.6*  HGB 16.0 14.8  HCT 47.0 43.6  PLT -- 156   BMET  Basename 10/09/11 0510 10/07/11 1837 10/07/11 1826  NA 139 141 --  K 3.8 4.0 --  CL 107 100 --  CO2 23 -- 29  GLUCOSE 156* 159* --  BUN 12 20 --  CREATININE 0.76 1.20 --  CALCIUM 8.1* -- 9.3    Studies/Results: Ct Angio Head W/cm &/or Wo Cm  10/08/2011  Clinical history unresponsiveness.  Abnormal CT scan of the brain. Question of subarachnoid hemorrhage.  Acute subdural hemorrhage.  Comparison CT scan of the brain of 10/08/2011 and CT scan of brain of 10/07/2011.  Examination.  A CT angiogram of the brain.  Contrast.  Omnipaque 350 50 ml .  Findings  Sagittal coronal and axial reformations images were evaluated following infusion of contrast.  The transaxial images demonstrate  extensive acute hemorrhagic collection in the subdural space on the left frontal temporal lobe temporal parietal and occipital convexity with associated mass effect on the underlying sulci.  There is associated  midline shift of approximately 11 mm from left to right.  Effacement of the left lateral ventricle is seen.  CT angiogram of the brain.  The dominant left vertebral artery is seen to opacify  normally to the cranial skull base.  Opacification the left post inferior cerebellar artery is seen.  A hypoplastic right vertebrobasilar junction is seen with the vessel terminating in a ipsilateral  right posterior inferior cerebellar artery.  The basilar artery demonstrates moderate tortuosity in the prepontine cistern.  The posterior cerebral arteries, the superior cerebellar arteries and the anterior-inferior cerebellar demonstrate opacification. Severe stenosis of the left posterior cerebral arteries at  its origin is seen.  Both internal carotid arteries in the petrous cavernous and the supraclinoid segments demonstrate wide patency.  The middle cerebral arteries bilaterally to the trifurcation branches demonstrate normal opacification.  Both anterior cerebral arteries demonstrate normal opacification to the A3 regions, with  mass effect to the right of the midline secondary to the left acute subdural hemorrhage.  No evidence of early opacification of the veins is noted.  Impression 1. No aneurysm is seen intracranially on the study.  2.  No evidence of arteriovenous shunting or arteriovenous fistula suggested. 3. Acute left cerebral convexity subdural hemorrhage with associated mass effect from left to right of approximately 11 mm.  The results were discussed with Dr.Sethi with neurology, and Dr. Venetia Maxon neurosurgery service.  Original Report Authenticated By: Oneal Grout, M.D.   Ct Head Wo Contrast  10/08/2011  *RADIOLOGY REPORT*  Clinical Data: Follow-up intracranial hemorrhage; fever.  CT HEAD WITHOUT CONTRAST  Technique:  Contiguous axial images were obtained from the base of the skull through the vertex  without contrast.  Comparison: CT of the head performed 10/07/2011  Findings:   A large left subdural hematoma is again noted, overlying the left cerebral hemisphere, measuring 1.6 cm in maximal thickness.  This appears to have decreased mildly in size from the prior study, possibly reflecting  mild resorption.  The degree of rightward midline shift has also improved, now measuring 1.2 cm.  A small intraparenchymal bleed within the right frontal lobe is not significantly changed in appearance.  A small amount of subdural blood along the left tentorium cerebelli also appears relatively stable.  Subarachnoid blood along the left cerebral hemisphere is essentially unchanged.  There is persistent partial effacement of the left lateral ventricle and third ventricle.  Small areas of white matter infarct along the left anterior corona radiata are essentially unchanged in distribution, though slightly less apparent.  No definite additional evolving infarct is characterized at this time.  Some degree of subfalcine herniation is again seen; no definite transtentorial herniation is evident.  There is no evidence of fracture; visualized osseous structures are unremarkable in appearance.  The visualized portions of the orbits are within normal limits. There is partial opacification of both maxillary sinuses and near-complete opacification of the ethmoid air cells.  The remaining paranasal sinuses and mastoid air cells are well-aerated.  No significant soft tissue abnormalities are seen.  IMPRESSION:  1.  Interval improvement in size of large left subdural hematoma, overlying the left cerebral hemisphere.  It now measures 1.6 cm in maximal thickness, decreased from 2.2 cm on the prior study.  The degree of rightward midline shift has also improved, now measuring 1.2 cm, compared to 1.6 cm on the prior study.  This may reflect some degree of resorption of the hematoma. 2.  Small areas of white matter infarct along the left anterior corona radiata are essentially unchanged in distribution, though slightly less apparent on the current study.  No evidence of additional infarct at this time. 3.  Stable appearance to the small right frontal intraparenchymal bleed, subdural blood along the left tentorium cerebelli,  subarachnoid blood along the left cerebral hemisphere and partial effacement of the left lateral ventricle and third ventricle. 4.  Partial opacification of both maxillary sinuses and near- complete opacification of the ethmoid air cells.  Findings were discussed with Landry Corporal RN on [id number] at 06:46 a.m. on 10/08/2011.  Original Report Authenticated By: Tonia Ghent, M.D.   Ct Head Wo Contrast  10/07/2011  *RADIOLOGY REPORT*  Clinical Data: Unresponsive with blown pupil.  Complaint of headache earlier today.  CT HEAD WITHOUT CONTRAST  Technique:  Contiguous axial images were obtained from the base of the skull through the vertex without contrast.  Comparison: None.  Findings: There is a large acute left sided subdural hemorrhage overlying the convexity measuring just over 2 cm in greatest thickness.  This is also associated with some adjacent communicating subarachnoid blood and significant mass effect. There is upwards of 16 mm of right subfalcine shift.  Basal cisterns are also partially obliterated due to mass effect.  There is some subarachnoid blood at the level of the basal cisterns. Focus of acute hemorrhage is also present in the right inferior frontal lobe measuring approximately 1 cm.  Cerebral edema present throughout the brain.  There are two discrete foci of deep white matter infarcts just superior to the left basal ganglia in the region of the corona radiata.  No evidence of skull fracture.  Mucosal thickening present in bilateral maxillary antra and ethmoid air cells.  IMPRESSION: Large, acute subdural hemorrhage over the left convexity with significant mass effect and evidence of subfalcine and uncal herniation.  Associated subarachnoid blood and also parenchymal hemorrhage in the inferior right frontal lobe.  Foci of deep white matter infarcts noted.  Critical Value/emergent results were called by telephone at the time of interpretation on 10/07/2011  at 1945 hours  to  Dr. Deretha Emory, who  verbally acknowledged these results.  Findings also reviewed with Dr. Lyman Speller from Neurology.  Original Report Authenticated By: Reola Calkins, M.D.   Dg Chest Portable 1 View  10/07/2011  *RADIOLOGY REPORT*  Clinical Data: Intubation.  PORTABLE CHEST - 1 VIEW  Comparison: None  Findings: The endotracheal tube is 3 cm above the carina.  The heart is normal in size.  There is tortuosity and ectasia of the thoracic aorta.  Low lung volumes with vascular crowding and atelectasis but no edema or effusions.  The bony thorax is intact.  IMPRESSION:  1.  The endotracheal tube is 3 cm above the carina. 2.  Low lung volumes with vascular crowding and atelectasis.  Original Report Authenticated By: P. Loralie Champagne, M.D.    Assessment/Plan: POD1 SDH evacuation.  Sedated and on vent.  Seized once last night.  Continue support, head CT this am. Temp 101 currently.  Appreciate CCM assistance.  LOS: 2 days    Dorian Heckle, MD 10/09/2011, 7:53 AM

## 2011-10-09 NOTE — Progress Notes (Signed)
Stroke Team Progress Note  SUBJECTIVE  Gregory Humphrey is a 70 y.o. male whose stroke event occurred 2 days ago. He presented with symptoms of headache while lying on the couch. He had a cold with severe cough since Thanksgiving. He went to OR yesterday for clot removal from AVM. Transferred to Neuro ICU. Questionable seizure activity last night per RN. Dr. Herma Carson, CCM did not note seizure activity per RN. Dr. Vickey Huger added Vimpat over the phone. Wife not at bedside during rounds; RN states she has called and will be here shortlyHe had emergent craniotomy by Dr. Venetia Maxon for removal of subdural clot yesterday. He was found to have a small cortical based AV malformation which was clotted off.CT angiogram earlier yesterday had shown no evidence of AV malformation or aneurysm  OBJECTIVE Most recent Vital Signs: Temp: 101.1 F (38.4 C) (11/29 0800) Temp src: Core (Comment) (11/29 0900) BP: 123/70 mmHg (11/29 0900) Pulse Rate: 81  (11/29 0900) Respiratory Rate: 16 O2 Saturdation: 100%  CBG (last 3)   Basename 10/09/11 0816 10/07/11 2221 10/07/11 1833  GLUCAP 137* 173* 154*   Intake/Output from previous day: 11/28 0701 - 11/29 0700 In: 5369.7 [I.V.:4974.7; IV Piggyback:395] Out: 1580 [Urine:1380; Blood:200]  IV Fluid Intake:     . sodium chloride 100 mL/hr at 10/09/11 0900  . niCARDipine Stopped (10/09/11 0258)  . propofol 30 mcg/kg/min (10/09/11 0900)   Diet:  NPO  Activity:  Bedrest  DVT Prophylaxis:  SCDs   Studies: CBC    Component Value Date/Time   WBC 11.6* 10/07/2011 1826   RBC 4.80 10/07/2011 1826   HGB 16.0 10/07/2011 1837   HCT 47.0 10/07/2011 1837   PLT 156 10/07/2011 1826   MCV 90.8 10/07/2011 1826   MCH 30.8 10/07/2011 1826   MCHC 33.9 10/07/2011 1826   RDW 12.8 10/07/2011 1826   LYMPHSABS 1.1 10/07/2011 1826   MONOABS 0.9 10/07/2011 1826   EOSABS 0.1 10/07/2011 1826   BASOSABS 0.0 10/07/2011 1826   CMP    Component Value Date/Time   NA 139 10/09/2011 0510   K 3.8 10/09/2011 0510   CL 107 10/09/2011 0510   CO2 23 10/09/2011 0510   GLUCOSE 156* 10/09/2011 0510   BUN 12 10/09/2011 0510   CREATININE 0.76 10/09/2011 0510   CALCIUM 8.1* 10/09/2011 0510   PROT 6.0 10/09/2011 0510   ALBUMIN 2.7* 10/09/2011 0510   AST 34 10/09/2011 0510   ALT 30 10/09/2011 0510   ALKPHOS 46 10/09/2011 0510   BILITOT 1.2 10/09/2011 0510   GFRNONAA >90 10/09/2011 0510   GFRAA >90 10/09/2011 0510   COAGS Lab Results  Component Value Date   INR 1.02 10/07/2011   Lipid Panel    Component Value Date/Time   CHOL 113 09/08/2011 0932   TRIG 103.0 09/08/2011 0932   HDL 30.10* 09/08/2011 0932   CHOLHDL 4 09/08/2011 0932   VLDL 20.6 09/08/2011 0932   LDLCALC 62 09/08/2011 0932   HgbA1C  Lab Results  Component Value Date   HGBA1C 5.1 12/04/2005   Urine Drug Screen  No results found for this basename: labopia, cocainscrnur, labbenz, amphetmu, thcu, labbarb    Alcohol Level No results found for this basename: eth     Ct Angio Head W/cm &/or Wo Cm 10/08/2011  1. No aneurysm is seen intracranially on the study.  2.  No evidence of arteriovenous shunting or arteriovenous fistula suggested. 3. Acute left cerebral convexity subdural hemorrhage with associated mass effect from left to right of  approximately 11 mm.   Ct Head Wo Contrast 10/08/2011  1.  Interval improvement in size of large left subdural hematoma, overlying the left cerebral hemisphere.  It now measures 1.6 cm in maximal thickness, decreased from 2.2 cm on the prior study.  The degree of rightward midline shift has also improved, now measuring 1.2 cm, compared to 1.6 cm on the prior study.  This may reflect some degree of resorption of the hematoma. 2.  Small areas of white matter infarct along the left anterior corona radiata are essentially unchanged in distribution, though slightly less apparent on the current study.  No evidence of additional infarct at this time. 3.  Stable appearance to the small  right frontal intraparenchymal bleed, subdural blood along the left tentorium cerebelli, subarachnoid blood along the left cerebral hemisphere and partial effacement of the left lateral ventricle and third ventricle. 4.  Partial opacification of both maxillary sinuses and near- complete opacification of the ethmoid air cells.    Dg Chest Port 1 View  10/09/2011   Clinical Data: Intubation, ventilatory support  Stable portable chest exam.   MRI of the brain  Not ordered  MRA of the brain  Not ordered  2D Echocardiogram  Not ordered  Carotid Doppler  Not ordered  EKG Not done. TELE shows NSR.   Physical Exam  Intubated. On ventilator. No agitation. Localizing on the right. Left side hemiplegic. Heart rate regular. Breath sounds clear. No seizure activity noted. Neurological exam comatose unresponsive. Pupils 3 mm reactive. Corneal reflexes present. Doll's eye movements are sluggish. Does not grimace to pain. Semipurposeful withdrawal of the right upper and lower extremity to pain. Moves left upper extremity less. Does withdraw the left lower extremity to pain. Both plantars are equally local. ASSESSMENT Mr. Gregory GUAJARDO is a 70 y.o. male with a large left SDH, secondary to ruptured AVM s/p craniotomy for hematoma evacuation with bone flap replaced. Remains intubated on the ventilator. Off propofol this am, no agitation noted.   ? Seizure post op. Vimpat added to Keppra last night. No specific seizure activity noted by Dr. Herma Carson per RN.  Stroke risk factors:  family history and hypertension  Hospital day # 2  TREATMENT/PLAN Stop Vimpat. Increase Keppra to 750 bid. Keep off propofol; may restart if agitation returns. Check CT head today.I spoke with patient's wife about his condition and discuss plan of care and answered questions.Prognosis remains guarded but hopefully he'll show improvement over the next few days.This patient is critically ill and at significant risk of neurological  worsening, death and care requires constant monitoring of vital signs, hemodynamics,respiratory and cardiac monitoring, neurological assessment, discussion with family, other specialists and medical decision making of high complexity. I spent 30 minutes of neurocritical care time  in the care of  this patient.  [name], ANP-BC, GNP-BC [facility] Pager: [phone] 10/09/2011 10:47 AM  Dr. Delia Heady, Stroke Center Medical Director, has personally reviewed chart, pertinent data, examined the patient and developed the plan of care.

## 2011-10-09 NOTE — Progress Notes (Signed)
Name: Gregory Humphrey MRN: [id number] DOB: 1941-02-23  DOS: 10/07/2011    LOS: 2  CRITICAL CARE ADMISSION NOTE  History of Present Illness: 70 y/o man with h/o HTN brought to [facility] ED unresponsive after complaining of headache earlier.  Intubated for airway protection.  Head CT demonstrated large acute subdural, subarachnoid and parenchimal hemorrhage. OR crani 11/28.  Lines / Drains: 11/27  ETT>>> 11/27  Foley>>> A line rt rad 11/28>>>  Cultures / Sepsis markers: Sputum 11.28>>>  Antibiotics: (concern CAP  Vs asp) Ceftriaxone 11/28>>> levoflox 11/28>>>  Tests / Events: 11/27  Found unresponsive.  Head CT demonstrated large acute subdural, subarachnoid and parenchimal hemorrhage. 11/28- poor neuro exam 11/28- OR drain SDH 11/28- CT angio, no aneursym  Vital Signs:  BP 141/68  Pulse 88  Temp(Src) 100.9 F (38.3 C) (Core (Comment))  Resp 20  Ht 5\' 10"  (1.778 m)  Wt 84.4 kg (186 lb 1.1 oz)  BMI 26.70 kg/m2  SpO2 99% I/O last 3 completed shifts: In: 6119.7 [I.V.:5724.7; IV Piggyback:395] Out: 2330 [Urine:2130; Blood:200] Total I/O In: 600 [I.V.:400; IV Piggyback:200] Out: 250 [Urine:250]   Physical Examination: Neuro:  Intubated, unresponsive, moving some on rue, not on sedation HEENT:  Sluggish but improved reaction Heart:  RRR, no M/R/G Lungs:  coarse Abdomen:  Soft, non tender, bowel sounds present Extremities: No edema  Labs and Imaging:  Reviewed.  Please refer to the Assessment and Plan section for relevant results.  Labs>  CBC    Component Value Date/Time   WBC 11.6* 10/07/2011 1826   RBC 4.80 10/07/2011 1826   HGB 16.0 10/07/2011 1837   HCT 47.0 10/07/2011 1837   PLT 156 10/07/2011 1826   MCV 90.8 10/07/2011 1826   MCH 30.8 10/07/2011 1826   MCHC 33.9 10/07/2011 1826   RDW 12.8 10/07/2011 1826   LYMPHSABS 1.1 10/07/2011 1826   MONOABS 0.9 10/07/2011 1826   EOSABS 0.1 10/07/2011 1826   BASOSABS 0.0 10/07/2011 1826    BMET    Component  Value Date/Time   NA 139 10/09/2011 0510   K 3.8 10/09/2011 0510   CL 107 10/09/2011 0510   CO2 23 10/09/2011 0510   GLUCOSE 156* 10/09/2011 0510   BUN 12 10/09/2011 0510   CREATININE 0.76 10/09/2011 0510   CALCIUM 8.1* 10/09/2011 0510   GFRNONAA >90 10/09/2011 0510   GFRAA >90 10/09/2011 0510    arterial blood gases   Assessment and Plan: Large acute subdural, subarachnoid and parenchimal hemorrhage. S/p crani, drainage NS Hob elevated Neuro exams Control HTN, now off vaso active meds  Seizures concern keprra increased per neurology  Acute ventilator dependent respiratory failure secondary to inability to protect airway ARF -->full support for now, no SBT  ---> pcxr reviewed , had some symptoms concerning cap , at risk ASp, see ID abg this am reviewed, reduce MV slight pcxr in am    HTN emergency Nicardipine to MAP goals, now not needed, off, follow for restart needs Tele consider cvp line if concerns volume status arise or BP  Drop[  malnurished ppi Start tf  Best practices / Disposition: -->ICU status under PCCM, Neurosurgery consulted -->DNR, aggressive care otherwise -->SCDs for DVT Px -->Protonix for GI Px -->ventilator bundle -->Start TF 11/29 -->family updated at bedside  The patient is critically ill with multiple organ systems failure and requires high complexity decision making for assessment and support, frequent evaluation and titration of therapies, application of advanced monitoring technologies and extensive interpretation of multiple databases. Critical Care  Time devoted to patient care services described in this note is 35 minutes.  Mcarthur Rossetti. Tyson Alias, MD, FACP Pgr: [phone] [facility]  Cell: [phone]  10/09/2011, 12:26 PM

## 2011-10-10 ENCOUNTER — Inpatient Hospital Stay (HOSPITAL_COMMUNITY): Payer: Medicare Other

## 2011-10-10 ENCOUNTER — Encounter (HOSPITAL_COMMUNITY): Payer: Self-pay | Admitting: Neurosurgery

## 2011-10-10 DIAGNOSIS — Z9911 Dependence on respirator [ventilator] status: Secondary | ICD-10-CM

## 2011-10-10 DIAGNOSIS — I629 Nontraumatic intracranial hemorrhage, unspecified: Secondary | ICD-10-CM

## 2011-10-10 DIAGNOSIS — J9600 Acute respiratory failure, unspecified whether with hypoxia or hypercapnia: Secondary | ICD-10-CM

## 2011-10-10 LAB — DIFFERENTIAL
Basophils Absolute: 0 10*3/uL (ref 0.0–0.1)
Basophils Relative: 0 % (ref 0–1)
Lymphocytes Relative: 10 % — ABNORMAL LOW (ref 12–46)
Monocytes Absolute: 0.9 10*3/uL (ref 0.1–1.0)
Monocytes Relative: 10 % (ref 3–12)
Neutro Abs: 7.2 10*3/uL (ref 1.7–7.7)
Neutrophils Relative %: 80 % — ABNORMAL HIGH (ref 43–77)

## 2011-10-10 LAB — COMPREHENSIVE METABOLIC PANEL
ALT: 23 U/L (ref 0–53)
AST: 27 U/L (ref 0–37)
Alkaline Phosphatase: 49 U/L (ref 39–117)
CO2: 24 mEq/L (ref 19–32)
Calcium: 8.3 mg/dL — ABNORMAL LOW (ref 8.4–10.5)
Chloride: 107 mEq/L (ref 96–112)
GFR calc Af Amer: >90 mL/min (ref >90)
GFR calc non Af Amer: >90 mL/min (ref >90)
Glucose, Bld: 167 mg/dL — ABNORMAL HIGH (ref 70–99)
Sodium: 141 mEq/L (ref 135–145)
Total Bilirubin: 1 mg/dL (ref 0.3–1.2)

## 2011-10-10 LAB — BLOOD GAS, ARTERIAL
Bicarbonate: 23.7 mEq/L (ref 20.0–24.0)
PEEP: 5 cmH2O
Patient temperature: 101.1
TCO2: 24.8 mmol/L (ref 0–100)
pCO2 arterial: 38.2 mmHg (ref 35.0–45.0)
pH, Arterial: 7.417 (ref 7.350–7.450)
pO2, Arterial: 175 mmHg — ABNORMAL HIGH (ref 80.0–100.0)

## 2011-10-10 LAB — GLUCOSE, CAPILLARY
Glucose-Capillary: 156 mg/dL — ABNORMAL HIGH (ref 70–99)
Glucose-Capillary: 153 mg/dL — ABNORMAL HIGH (ref 70–99)

## 2011-10-10 LAB — CBC
HCT: 33.4 % — ABNORMAL LOW (ref 39.0–52.0)
Hemoglobin: 11.4 g/dL — ABNORMAL LOW (ref 13.0–17.0)
MCHC: 34.1 g/dL (ref 30.0–36.0)
WBC: 9 10*3/uL (ref 4.0–10.5)

## 2011-10-10 MED ORDER — SODIUM CHLORIDE 0.9 % IV SOLN
INTRAVENOUS | Status: DC
  Administered 2011-10-10 – 2011-10-20 (×3): via INTRAVENOUS

## 2011-10-10 NOTE — Progress Notes (Signed)
Subjective: Patient reports Vent support  Objective: Vital signs in last 24 hours: Temp:  [100.3 F (37.9 C)-101.5 F (38.6 C)] 101.1 F (38.4 C) (11/30 0800) Pulse Rate:  [69-88] 73  (11/30 0800) Resp:  [14-41] 15  (11/30 0800) BP: (120-195)/(52-85) 139/68 mmHg (11/30 0800) SpO2:  [99 %-100 %] 100 % (11/30 0800) Arterial Line BP: (149)/(70) 149/70 mmHg (11/30 0800) FiO2 (%):  [39.8 %-40.2 %] 40 % (11/30 0800) Weight:  [96.3 kg (212 lb 4.9 oz)] 212 lb 4.9 oz (96.3 kg) (11/30 0448)  Intake/Output from previous day: 11/29 0701 - 11/30 0700 In: 2855 [P.O.:60; I.V.:2200; NG/GT:285; IV Piggyback:310] Out: 1945 [Urine:1945] Intake/Output this shift: Total I/O In: 200 [I.V.:100; NG/GT:100] Out: 50 [Urine:50]    Lab Results:  [facility] 10/10/11 0415 10/07/11 1837 10/07/11 1826  WBC 9.0 -- 11.6*  HGB 11.4* 16.0 --  HCT 33.4* 47.0 --  PLT 121* -- 156   BMET  Basename 10/10/11 0415 10/09/11 0510  NA 141 139  K 3.8 3.8  CL 107 107  CO2 24 23  GLUCOSE 167* 156*  BUN 15 12  CREATININE 0.71 0.76  CALCIUM 8.3* 8.1*    Studies/Results: Ct Angio Head W/cm &/or Wo Cm  10/08/2011  Clinical history unresponsiveness.  Abnormal CT scan of the brain. Question of subarachnoid hemorrhage.  Acute subdural hemorrhage.  Comparison CT scan of the brain of 10/08/2011 and CT scan of brain of 10/07/2011.  Examination.  A CT angiogram of the brain.  Contrast.  Omnipaque 350 50 ml .  Findings  Sagittal coronal and axial reformations images were evaluated following infusion of contrast.  The transaxial images demonstrate  extensive acute hemorrhagic collection in the subdural space on the left frontal temporal lobe temporal parietal and occipital convexity with associated mass effect on the underlying sulci.  There is associated  midline shift of approximately 11 mm from left to right.  Effacement of the left lateral ventricle is seen.  CT angiogram of the brain.  The dominant left vertebral artery is  seen to opacify normally to the cranial skull base.  Opacification the left post inferior cerebellar artery is seen.  A hypoplastic right vertebrobasilar junction is seen with the vessel terminating in a ipsilateral  right posterior inferior cerebellar artery.  The basilar artery demonstrates moderate tortuosity in the prepontine cistern.  The posterior cerebral arteries, the superior cerebellar arteries and the anterior-inferior cerebellar demonstrate opacification. Severe stenosis of the left posterior cerebral arteries at  its origin is seen.  Both internal carotid arteries in the petrous cavernous and the supraclinoid segments demonstrate wide patency.  The middle cerebral arteries bilaterally to the trifurcation branches demonstrate normal opacification.  Both anterior cerebral arteries demonstrate normal opacification to the A3 regions, with  mass effect to the right of the midline secondary to the left acute subdural hemorrhage.  No evidence of early opacification of the veins is noted.  Impression 1. No aneurysm is seen intracranially on the study.  2.  No evidence of arteriovenous shunting or arteriovenous fistula suggested. 3. Acute left cerebral convexity subdural hemorrhage with associated mass effect from left to right of approximately 11 mm.  The results were discussed with Dr.Sethi with neurology, and Dr. Venetia Maxon neurosurgery service.  Original Report Authenticated By: Oneal Grout, M.D.   Ct Head Wo Contrast  10/09/2011  *RADIOLOGY REPORT*  Clinical Data: Subdural hematoma status post craniotomy.  CT HEAD WITHOUT CONTRAST  Technique:  Contiguous axial images were obtained from the base of the  skull through the vertex without contrast.  Comparison: CT angiogram 10/08/2011 at [facility].  Findings: The patient is now status post left frontal craniotomy for evacuation of a left subdural hematoma.  There is some hyperdensity persisting along the dura with a small extra-axial fluid  collection over the left temporal and frontal convexity. Moderate pneumocephalus is evident bilaterally.  There is significant improvement in midline shift, now measuring 5 mm, compared with 10-12 mm previously.   A focal area of hypoattenuation adjacent to the left caudate head persists without significant change.  There is partial re-expansion of the left lateral ventricle.  There is no hydrocephalus.  Note is made of blood layering along the tentorium, as before.  Focal parenchymal hemorrhage in the right frontal lobe is stable.  No new hemorrhage is evident.  There is no evidence for acute infarct or mass lesion.  Near total opacification of the maxillary sinuses bilaterally as well as scattered ethmoid air cell disease and an air-fluid level in the left frontal sinus is noted.  This represents progression of sinus disease since the prior studies.  IMPRESSION:  1.  Interval craniotomy with evacuation of the left subdural hematoma. 2.  Some residual extra-axial fluid on the left with minimal blood products along the dura. 3.  Improved midline shift, now measuring 5 mm. 4.  Stable parenchymal hemorrhage in the anterior right frontal lobe.  Original Report Authenticated By: Jamesetta Orleans. MATTERN, M.D.   Dg Chest Port 1 View  10/09/2011  *RADIOLOGY REPORT*  Clinical Data: Intubation, ventilatory support  PORTABLE CHEST - 1 VIEW  Comparison: 10/07/2011  Findings: Endotracheal tube 3.4 cm above the carina.  Decreased lung volumes persist with central vascular congestion and bibasilar atelectasis.  Left costophrenic angle excluded on the film.  No enlarging effusion or pneumothorax.  No significant interval change.  Degenerative changes of the spine.  IMPRESSION: Stable portable chest exam.  Original Report Authenticated By: Judie Petit. Ruel Favors, M.D.    Assessment/Plan: PEARL, spontaneous movement RUE reported, movement with painful stimulus other extremities. No seizure activity. Drsg intact. Some periorbital  swelling OS.  LOS: 3 days  Continue support.   PoteatArlys John 10/10/2011, 8:11 AM     Swelling over left eye.  Difficult to assess left pupil.  Some spontaneous movement of both upper extremities.  No commands or eye opening off sedation.  CCM to address fever.  Will continue vent support through weekend, then assess neurologic function and address with family.  Discussed with patient's wife today.

## 2011-10-10 NOTE — Progress Notes (Signed)
PT Discharge Note  Patient is being discharged from PT services secondary to: Current medical status. Please re-order when patient deemed medically ready to mobilize.    Patient unable to participate in discharge planning   10/10/2011 Edwyna Perfect, PT  Pager [phone]

## 2011-10-10 NOTE — Progress Notes (Signed)
Clinical Social Worker completed assessment, please see shadow chart for details.   Angelia Mould, MSW, [place] [phone]

## 2011-10-10 NOTE — Progress Notes (Addendum)
Stroke Team Progress Note  SUBJECTIVE [name] is a 70 y.o. male whose wife is at the bedside. She feels he is about the same. He reamins intubated in the neuro ICU.he has been off propofol now for 24 hours and required only a few doses of fentanyl for comfort. He remains unresponsive but does have some spontaneous right sided movements  OBJECTIVE Most recent Vital Signs: Temp: 101.1 F (38.4 C) (11/30 0800) Temp src: Oral (11/30 0400) BP: 139/68 mmHg (11/30 0800) Pulse Rate: 73  (11/30 0800) Respiratory Rate: 15 O2 Saturdation: 100%  CBG (last 3)   Basename 10/10/11 0436 10/09/11 1937 10/09/11 1646  GLUCAP 156* 130* 129*   Intake/Output from previous day: 11/29 0701 - 11/30 0700 In: 2855 [P.O.:60; I.V.:2200; NG/GT:285; IV Piggyback:310] Out: 1945 [Urine:1945]  IV Fluid Intake     . sodium chloride 100 mL/hr at 10/10/11 0800  . feeding supplement (PROMOTE) 1,000 mL (10/09/11 2008)  . niCARDipine Stopped (10/09/11 0258)  . propofol Stopped (10/09/11 0915)  . DISCONTD: feeding supplement (OSMOLITE 1.2 CAL)     Diet  NPO panda tube feeds at 82ml/hr  Activity  bedrest  DVT Prophylaxis  SCDs   Studies COMPREHENSIVE METABOLIC PANEL     Status: Abnormal   Collection Time   10/10/11  4:15 AM      Component Value Range   Sodium 141  135 - 145 (mEq/L)   Potassium 3.8  3.5 - 5.1 (mEq/L)   Chloride 107  96 - 112 (mEq/L)   CO2 24  19 - 32 (mEq/L)   Glucose, Bld 167 (*) 70 - 99 (mg/dL)   BUN 15  6 - 23 (mg/dL)   Creatinine, Ser 4.09  0.50 - 1.35 (mg/dL)   Calcium 8.3 (*) 8.4 - 10.5 (mg/dL)   Total Protein 6.0  6.0 - 8.3 (g/dL)   Albumin 2.5 (*) 3.5 - 5.2 (g/dL)   AST 27  0 - 37 (U/L)   ALT 23  0 - 53 (U/L)   Alkaline Phosphatase 49  39 - 117 (U/L)   Total Bilirubin 1.0  0.3 - 1.2 (mg/dL)   GFR calc non Af Amer >90  >90 (mL/min)   GFR calc Af Amer >90  >90 (mL/min)  CBC     Status: Abnormal   Collection Time   10/10/11  4:15 AM      Component Value Range   WBC 9.0  4.0 - 10.5 (K/uL)   RBC 3.61 (*) 4.22 - 5.81 (MIL/uL)   Hemoglobin 11.4 (*) 13.0 - 17.0 (g/dL)   HCT 81.1 (*) 91.4 - 52.0 (%)   MCV 92.5  78.0 - 100.0 (fL)   MCH 31.6  26.0 - 34.0 (pg)   MCHC 34.1  30.0 - 36.0 (g/dL)   RDW 78.2  95.6 - 21.3 (%)   Platelets 121 (*) 150 - 400 (K/uL)  DIFFERENTIAL     Status: Abnormal   Collection Time   10/10/11  4:15 AM      Component Value Range   Neutrophils Relative 80 (*) 43 - 77 (%)   Neutro Abs 7.2  1.7 - 7.7 (K/uL)   Lymphocytes Relative 10 (*) 12 - 46 (%)   Lymphs Abs 0.9  0.7 - 4.0 (K/uL)   Monocytes Relative 10  3 - 12 (%)   Monocytes Absolute 0.9  0.1 - 1.0 (K/uL)   Eosinophils Relative 0  0 - 5 (%)   Eosinophils Absolute 0.0  0.0 - 0.7 (K/uL)  Basophils Relative 0  0 - 1 (%)   Basophils Absolute 0.0  0.0 - 0.1 (K/uL)  BLOOD GAS, ARTERIAL     Status: Abnormal   Collection Time   10/10/11  4:40 AM      Component Value Range   FIO2 .40     Delivery systems VENTILATOR     Mode PRESSURE REGULATED VOLUME CONTROL     VT 620     Rate 14     Peep/cpap 5.0     pH, Arterial 7.417  7.350 - 7.450    pCO2 arterial 38.2  35.0 - 45.0 (mmHg)   pO2, Arterial 175.0 (*) 80.0 - 100.0 (mmHg)   Bicarbonate 23.7  20.0 - 24.0 (mEq/L)   TCO2 24.8  0 - 100 (mmol/L)   Acid-Base Excess 0.0  0.0 - 2.0 (mmol/L)   O2 Saturation 99.8     Patient temperature 101.1     Collection site A-LINE     Drawn by COLLECTED BY NURSE     Sample type ARTERIAL DRAW     Allens test (pass/fail) PASS  PASS      Ct Head Wo Contrast  10/09/2011   1.  Interval craniotomy with evacuation of the left subdural hematoma. 2.  Some residual extra-axial fluid on the left with minimal blood products along the dura. 3.  Improved midline shift, now measuring 5 mm. 4.  Stable parenchymal hemorrhage in the anterior right frontal lobe.   Dg Chest Port 1 View  10/09/2011   Stable portable chest exam.   Physical Exam  Intubated. On ventilator. Not waking up. No  agitation.  Localizing on the right. Left side hemiplegic. Heart rate regular. Breath sounds clear. No seizure activity noted. Left eye is swollen today and can barely be opened Neurological exam comatose unresponsive. Pupils 3 mm reactive. Corneal reflexes present. Doll's eye movements are sluggish. Does not grimace to pain. Semipurposeful withdrawal of the right upper and lower extremity to pain. Moves left upper extremity less. Does withdraw the left lower extremity to pain. Both plantars are equivocal.  ASSESSMENT [name] is a 71 y.o. male with a large L SDH with left SDH and 1.4 cm left to right shift with subfalcine  And uncal herniation and cerebral edema post evacuation by Dr. Venetia Maxon. Hemorrhage secondary to ruptured AVM from coughing. Not waking up. Concerned for seizure but cannot check EEG due to cranial bandages.   Stroke risk factors:  family history and hypertension  Hospital day # 3  TREATMENT/PLAN Continue current treatment over the weekend. wife does not want trach and PEG long term. Will make treatment decision, determine long term plan of care next week.This patient is critically ill and at significant risk of neurological worsening, death and care requires constant monitoring of vital signs, hemodynamics,respiratory and cardiac monitoring, neurological assessment, discussion with family, other specialists and medical decision making of high complexity. I spent 30 minutes of neurocritical care time  in the care of  this patient.   [name], ANP-BC, GNP-BC [facility] Pager: [phone] 10/10/2011 8:21 AM  Dr. Delia Heady, Stroke Center Medical Director, has personally reviewed chart, pertinent data, examined the patient and developed the plan of care.

## 2011-10-10 NOTE — Progress Notes (Signed)
Elink MD contacted regarding increased temp to 101.  There was an order entered for blood cultures if temp was greater than 101 but no actual order was entered for the cultures to be drawn. When MD was notified about needed the order to be placed to have the cultures drawn no new orders were given. Will continue to monitor and assess.

## 2011-10-10 NOTE — Progress Notes (Signed)
Name: Gregory Humphrey MRN: [id number] DOB: 1940-12-02  DOS: 10/07/2011    LOS: 3  CRITICAL CARE CONSULT FOLLOWUP NOTE  History of Present Illness: 70 y/o man with h/o HTN brought to [facility] ED unresponsive after complaining of headache earlier.  Intubated for airway protection.  Head CT demonstrated large acute subdural, subarachnoid and parenchimal hemorrhage. OR crani 11/28.  Lines / Drains: 11/27  ETT>>> 11/27  Foley>>> A line rt rad 11/28 >>>  Cultures / Sepsis markers: Sputum 11.28 >>>  Antibiotics: (concern CAP  Vs asp) Ceftriaxone 11/28 >>> levoflox 11/28 >>  Tests / Events: 11/27  Found unresponsive.  Head CT demonstrated large acute subdural, subarachnoid and parenchimal hemorrhage. 11/28- poor neuro exam 11/28- OR drain SDH 11/28- CT angio, no aneursym 11/28 Craniotomy for spontaneous SDH evacuation  Vital Signs:  BP 145/71  Pulse 74  Temp(Src) 101.5 F (38.6 C) (Core (Comment))  Resp 21  Ht 5\' 10"  (1.778 m)  Wt 96.3 kg (212 lb 4.9 oz)  BMI 30.46 kg/m2  SpO2 100% I/O last 3 completed shifts: In: 4677.2 [P.O.:60; I.V.:3877.2; NG/GT:285; IV Piggyback:455] Out: 3060 [Urine:3060] Total I/O In: 930 [I.V.:400; NG/GT:330; IV Piggyback:200] Out: 190 [Urine:190]   Physical Examination: Neuro:  Intubated, unresponsive, moving some on rue, not on sedation HEENT:  Sluggish but improved reaction Heart:  RRR, no M/R/G Lungs:  coarse Abdomen:  Soft, non tender, bowel sounds present Extremities: No edema  Labs and Imaging:  Reviewed.  Please refer to the Assessment and Plan section for relevant results.  CBC    Component Value Date/Time   WBC 9.0 10/10/2011 0415   RBC 3.61* 10/10/2011 0415   HGB 11.4* 10/10/2011 0415   HCT 33.4* 10/10/2011 0415   PLT 121* 10/10/2011 0415   MCV 92.5 10/10/2011 0415   MCH 31.6 10/10/2011 0415   MCHC 34.1 10/10/2011 0415   RDW 12.9 10/10/2011 0415   LYMPHSABS 0.9 10/10/2011 0415   MONOABS 0.9 10/10/2011 0415   EOSABS 0.0  10/10/2011 0415   BASOSABS 0.0 10/10/2011 0415    BMET    Component Value Date/Time   NA 141 10/10/2011 0415   K 3.8 10/10/2011 0415   CL 107 10/10/2011 0415   CO2 24 10/10/2011 0415   GLUCOSE 167* 10/10/2011 0415   BUN 15 10/10/2011 0415   CREATININE 0.71 10/10/2011 0415   CALCIUM 8.3* 10/10/2011 0415   GFRNONAA >90 10/10/2011 0415   GFRAA >90 10/10/2011 0415    arterial blood gases   Assessment and Plan: 1) Large acute subdural hemorrhage with herniation. Now s/p crani, drainage Mgmt per NS and Neuro. Prognosis poor  2) Seizures concern keprra increased per neurology  3) Acute ventilator dependent respiratory failure due to neuro status --Cont full support for now, no SBT   4) Possible PNA - cont current abx  5) HTN emergency BP OK off nicardipine  6) malnurished - Cont TFs  Best practices / Disposition: -->ICU status under PCCM, Neurosurgery consulted -->DNR, aggressive care otherwise, reassess first of next week, family well informed and insightful -->SCDs for DVT Px -->Protonix for GI Px -->ventilator bundle -->Start TF 11/29 -->family updated at bedside  The patient is critically ill with multiple organ systems failure and requires high complexity decision making for assessment and support, frequent evaluation and titration of therapies, application of advanced monitoring technologies and extensive interpretation of multiple databases. Critical Care Time devoted to patient care services described in this note is 35 minutes.  I updated the pt's wife in detail  Billy Fischer, MD;  PCCM service; Mobile [phone]   10/10/2011, 12:32 PM

## 2011-10-10 NOTE — Progress Notes (Signed)
OT Discharge Note  Patient is being discharged from OT services secondary to:    Medical decline- will need to re-order to resume therapy services.  Progress and discharge plan and discussed with patient/caregiver and they    Patient unable to participate in discharge planning  Gregory Humphrey    OTR/L Pager: [phone] 10/10/2011

## 2011-10-11 ENCOUNTER — Inpatient Hospital Stay (HOSPITAL_COMMUNITY): Payer: Medicare Other

## 2011-10-11 LAB — GLUCOSE, CAPILLARY
Glucose-Capillary: 170 mg/dL — ABNORMAL HIGH (ref 70–99)
Glucose-Capillary: 140 mg/dL — ABNORMAL HIGH (ref 70–99)
Glucose-Capillary: 157 mg/dL — ABNORMAL HIGH (ref 70–99)
Glucose-Capillary: 132 mg/dL — ABNORMAL HIGH (ref 70–99)

## 2011-10-11 LAB — BASIC METABOLIC PANEL
BUN: 18 mg/dL (ref 6–23)
CO2: 24 mEq/L (ref 19–32)
Chloride: 106 mEq/L (ref 96–112)
Glucose, Bld: 187 mg/dL — ABNORMAL HIGH (ref 70–99)
Potassium: 3.7 mEq/L (ref 3.5–5.1)
Sodium: 139 mEq/L (ref 135–145)

## 2011-10-11 LAB — CBC
HCT: 33.8 % — ABNORMAL LOW (ref 39.0–52.0)
Hemoglobin: 11.7 g/dL — ABNORMAL LOW (ref 13.0–17.0)
MCH: 31.7 pg (ref 26.0–34.0)
MCHC: 34.6 g/dL (ref 30.0–36.0)
RBC: 3.69 MIL/uL — ABNORMAL LOW (ref 4.22–5.81)

## 2011-10-11 MED ORDER — PANTOPRAZOLE SODIUM 40 MG IV SOLR
40.0000 mg | INTRAVENOUS | Status: DC
  Administered 2011-10-11 – 2011-10-17 (×7): 40 mg via INTRAVENOUS
  Filled 2011-10-11 (×8): qty 40

## 2011-10-11 NOTE — Progress Notes (Signed)
Name: Gregory Humphrey MRN: [id number] DOB: May 18, 1941  DOS: 10/07/2011    LOS: 4  CRITICAL CARE CONSULT FOLLOWUP NOTE  History of Present Illness: 70 y/o man with h/o HTN brought to [facility] ED unresponsive after complaining of headache earlier.  Intubated for airway protection.  Head CT demonstrated large acute subdural, subarachnoid and parenchimal hemorrhage. OR crani 11/28.  Lines / Drains: 11/27  ETT>>> 11/27  Foley>>> A line rt rad 11/28 >>>  Cultures / Sepsis markers: Sputum 11.28 >>> abundant gpc pairs  Antibiotics: (concern CAP  Vs asp) Ceftriaxone 11/28 >>> levoflox 11/28 >>  Tests / Events: 11/27  Found unresponsive.  Head CT demonstrated large acute subdural, subarachnoid and parenchimal hemorrhage. 11/28- poor neuro exam 11/28- OR drain SDH 11/28- CT angio, no aneursym 11/28 Craniotomy for spontaneous SDH evacuation  Vital Signs:  BP 146/72  Pulse 74  Temp(Src) 100.6 F (38.1 C) (Core (Comment))  Resp 12  Ht 5\' 10"  (1.778 m)  Wt 97.5 kg (214 lb 15.2 oz)  BMI 30.84 kg/m2  SpO2 99% I/O last 3 completed shifts: In: 4365 [P.O.:60; I.V.:1760; NG/GT:2120; IV Piggyback:425] Out: 2845 [Urine:2845] Total I/O In: 375 [I.V.:60; NG/GT:315] Out: 200 [Urine:200]   Physical Examination: Gen: well appearing, no acute distress HEENT: head dressing in place, pupils 2-3 mm, reactive bilat, ett in place PULM: cta b CV: RRR, no mgr, no JVD AB: BS+, soft, nontender, no hsm Ext: warm, pitting edema, no clubbing, no cyanosis Derm: no rash or skin breakdown Neuro: somnolent on vent, withdraws to pain on R, minimal/slow response on left   Labs and Imaging:  Reviewed.  Please refer to the Assessment and Plan section for relevant results.  CBC    Component Value Date/Time   WBC 8.3 10/11/2011 0348   RBC 3.69* 10/11/2011 0348   HGB 11.7* 10/11/2011 0348   HCT 33.8* 10/11/2011 0348   PLT 146* 10/11/2011 0348   MCV 91.6 10/11/2011 0348   MCH 31.7 10/11/2011 0348   MCHC 34.6  10/11/2011 0348   RDW 12.7 10/11/2011 0348   LYMPHSABS 0.9 10/10/2011 0415   MONOABS 0.9 10/10/2011 0415   EOSABS 0.0 10/10/2011 0415   BASOSABS 0.0 10/10/2011 0415    BMET    Component Value Date/Time   NA 139 10/11/2011 0348   K 3.7 10/11/2011 0348   CL 106 10/11/2011 0348   CO2 24 10/11/2011 0348   GLUCOSE 187* 10/11/2011 0348   BUN 18 10/11/2011 0348   CREATININE 0.73 10/11/2011 0348   CALCIUM 8.2* 10/11/2011 0348   GFRNONAA >90 10/11/2011 0348   GFRAA >90 10/11/2011 0348    arterial blood gases: no abg today  CXR reviewed: support apparatus in place, no acute changes  Assessment and Plan: 1) Large acute subdural hemorrhage with herniation. Now s/p crani, drainage Mgmt per NS and Neuro. Prognosis poor  2) Seizures concern keprra increased per neurology  3) Acute ventilator dependent respiratory failure due to neuro status --Cont full support for now, no SBT  --ABG/CXR in AM  4) Possible PNA - cont current abx  5) HTN emergency BP OK off nicardipine  6) malnurished - Cont TFs  Best practices / Disposition: -->ICU status under PCCM, Neurosurgery consulted -->DNR, aggressive care otherwise, reassess first of next week, family well informed and insightful -->SCDs for DVT Px -->Protonix for GI Px -->ventilator bundle -->Start TF 11/29 -->family updated at bedside  The patient is critically ill with multiple organ systems failure and requires high complexity decision making for assessment  and support, frequent evaluation and titration of therapies, application of advanced monitoring technologies and extensive interpretation of multiple databases. Critical Care Time devoted to patient care services described in this note is 30 minutes.  MCQUAID, DOUGLAS  10/11/2011, 10:39 AM

## 2011-10-11 NOTE — Progress Notes (Signed)
Stroke Team Progress Note  SUBJECTIVE Mr. TAJAE RYBICKI is a 70 y.o. male who plan is neurologically unchanged. He remains unresponsive and barely localizes to pain with his right more than left upper extremity. He has not shown significant neurological improvement now for the last 48 hours despite his craniotomy for hematoma evacuation. He otherwise remains hemodynamically stable OBJECTIVE Most recent Vital Signs: Temp: 100.4 F (38 C) (12/01 0845) Temp src: Core (Comment) (12/01 0845) BP: 199/79 mmHg (12/01 0845) Pulse Rate: 80  (12/01 0845) Respiratory Rate: 20 O2 Saturdation: 100%  CBG (last 3)   Basename 10/11/11 0816 10/11/11 0352 10/11/11 0036  GLUCAP 140* 170* 162*   Intake/Output from previous day: 11/30 0701 - 12/01 0700 In: 2902.5 [I.V.:760; IO/NG:2952; IV Piggyback:307.5] Out: 2000 [Urine:2000]  IV Fluid Intake:     . sodium chloride 20 mL/hr at 10/11/11 0800  . feeding supplement (PROMOTE) 1,000 mL (10/09/11 2008)  . niCARDipine Stopped (10/09/11 0258)  . propofol Stopped (10/09/11 0915)  . DISCONTD: sodium chloride 100 mL/hr at 10/10/11 1200   Diet:   panda feeds  Activity:bedrest DVT Prophylaxis:  scds  Studies: Results for orders placed during the hospital encounter of 10/07/11 (from the past 24 hour(s))  GLUCOSE, CAPILLARY     Status: Abnormal   Collection Time   10/10/11  1:33 PM      Component Value Range   Glucose-Capillary 156 (*) 70 - 99 (mg/dL)  GLUCOSE, CAPILLARY     Status: Abnormal   Collection Time   10/10/11  3:28 PM      Component Value Range   Glucose-Capillary 144 (*) 70 - 99 (mg/dL)   Comment 1 Notify RN     Comment 2 Documented in Chart    GLUCOSE, CAPILLARY     Status: Abnormal   Collection Time   10/10/11  7:39 PM      Component Value Range   Glucose-Capillary 142 (*) 70 - 99 (mg/dL)   Comment 1 Notify RN     Comment 2 Documented in Chart    GLUCOSE, CAPILLARY     Status: Abnormal   Collection Time   10/11/11 12:36 AM     Component Value Range   Glucose-Capillary 162 (*) 70 - 99 (mg/dL)  BASIC METABOLIC PANEL     Status: Abnormal   Collection Time   10/11/11  3:48 AM      Component Value Range   Sodium 139  135 - 145 (mEq/L)   Potassium 3.7  3.5 - 5.1 (mEq/L)   Chloride 106  96 - 112 (mEq/L)   CO2 24  19 - 32 (mEq/L)   Glucose, Bld 187 (*) 70 - 99 (mg/dL)   BUN 18  6 - 23 (mg/dL)   Creatinine, Ser 8.41  0.50 - 1.35 (mg/dL)   Calcium 8.2 (*) 8.4 - 10.5 (mg/dL)   GFR calc non Af Amer >90  >90 (mL/min)   GFR calc Af Amer >90  >90 (mL/min)  CBC     Status: Abnormal   Collection Time   10/11/11  3:48 AM      Component Value Range   WBC 8.3  4.0 - 10.5 (K/uL)   RBC 3.69 (*) 4.22 - 5.81 (MIL/uL)   Hemoglobin 11.7 (*) 13.0 - 17.0 (g/dL)   HCT 32.4 (*) 40.1 - 52.0 (%)   MCV 91.6  78.0 - 100.0 (fL)   MCH 31.7  26.0 - 34.0 (pg)   MCHC 34.6  30.0 - 36.0 (g/dL)  RDW 12.7  11.5 - 15.5 (%)   Platelets 146 (*) 150 - 400 (K/uL)  GLUCOSE, CAPILLARY     Status: Abnormal   Collection Time   10/11/11  3:52 AM      Component Value Range   Glucose-Capillary 170 (*) 70 - 99 (mg/dL)  GLUCOSE, CAPILLARY     Status: Abnormal   Collection Time   10/11/11  8:16 AM      Component Value Range   Glucose-Capillary 140 (*) 70 - 99 (mg/dL)     Ct Head Wo Contrast  10/09/2011  *RADIOLOGY REPORT*  Clinical Data: Subdural hematoma status post craniotomy.  CT HEAD WITHOUT CONTRAST  Technique:  Contiguous axial images were obtained from the base of the skull through the vertex without contrast.  Comparison: CT angiogram 10/08/2011 at [facility].  Findings: The patient is now status post left frontal craniotomy for evacuation of a left subdural hematoma.  There is some hyperdensity persisting along the dura with a small extra-axial fluid collection over the left temporal and frontal convexity. Moderate pneumocephalus is evident bilaterally.  There is significant improvement in midline shift, now measuring 5 mm, compared  with 10-12 mm previously.   A focal area of hypoattenuation adjacent to the left caudate head persists without significant change.  There is partial re-expansion of the left lateral ventricle.  There is no hydrocephalus.  Note is made of blood layering along the tentorium, as before.  Focal parenchymal hemorrhage in the right frontal lobe is stable.  No new hemorrhage is evident.  There is no evidence for acute infarct or mass lesion.  Near total opacification of the maxillary sinuses bilaterally as well as scattered ethmoid air cell disease and an air-fluid level in the left frontal sinus is noted.  This represents progression of sinus disease since the prior studies.  IMPRESSION:  1.  Interval craniotomy with evacuation of the left subdural hematoma. 2.  Some residual extra-axial fluid on the left with minimal blood products along the dura. 3.  Improved midline shift, now measuring 5 mm. 4.  Stable parenchymal hemorrhage in the anterior right frontal lobe.  Original Report Authenticated By: Jamesetta Orleans. MATTERN, M.D.   Dg Chest Port 1 View  10/11/2011  *RADIOLOGY REPORT*  Clinical Data: Respiratory failure and shortness of breath  PORTABLE CHEST - 1 VIEW  Comparison: October 10, 2011  Findings: The ET tube tip remains well above the carina.  The nasogastric tube courses off the inferior film.  Left lower lobe opacity appears slightly improved.  Plate-like atelectasis in the right lower lung remains present.  IMPRESSION: Slight interval improvement in appearance of left lower lobe opacity.  Original Report Authenticated By: Brandon Melnick, M.D.   Dg Chest Port 1 View  10/10/2011  *RADIOLOGY REPORT*  Clinical Data: Endotracheal tube placement  PORTABLE CHEST - 1 VIEW  Comparison: 10/09/2011  Findings: Endotracheal and orogastric tubes appropriately positioned. Bilateral lower lobe atelectasis with small left pleural effusion again noted.    Heart size is normal.  Cardiac leads obscure detail.  IMPRESSION:  No significant change in the left lower lobe small effusion and compressive atelectasis versus superimposed pneumonia.  Original Report Authenticated By: Harrel Lemon, M.D.    Physical Exam:   Intubated. On ventilator. Not waking up. No agitation. Localizing on the right. Left side hemiplegic. Heart rate regular. Breath sounds clear. No seizure activity noted. Left eye  Swelling is less today and can easily be opened  Neurological exam comatose  unresponsive. Pupils 3 mm reactive. Corneal reflexes present. Doll's eye movements are sluggish. Does not grimace to pain. Semipurposeful withdrawal of the right upper and lower extremity to pain. Moves left upper extremity less. Does withdraw the left lower extremity to pain. Both plantars are equivocal  ASSESSMENT Mr. SHAIL URBAS is a 70 y.o. male with a  large L SDH with left SDH and 1.4 cm left to right shift with subfalcine And uncal herniation and cerebral edema post evacuation by Dr. Venetia Maxon. Hemorrhage secondary to ruptured AVM from coughing. Not waking up. Concerned for seizure but cannot check EEG due to cranial bandages  Stroke risk factors:  family history and hypertension     Hospital day # 4  TREATMENT/PLAN  Continue current treatment over the weekend. wife does not want trach and PEG long term.I had a long discussion with the patient's son and wife who were present at the bedside pronounced at questions. His prognosis remains guarded  due to lack of clinical improvement in the last 48 hours after the craniotomy. Will make treatment decision, determine long term plan of care next week.This patient is critically ill and at significant risk of neurological worsening, death and care requires constant monitoring of vital signs, hemodynamics,respiratory and cardiac monitoring, neurological assessment, discussion with family, other specialists and medical decision making of high complexity. I spent 30 minutes of neurocritical care time in the  care of this patient.    Gates Rigg, MD [facility] Pager: [phone] 10/11/2011 9:47 AM

## 2011-10-11 NOTE — Progress Notes (Signed)
Subjective:  Patient resting in bed he continues on a ventilator via a endotracheal tube. Continues on tube feeding and a backup IV.  Objective: Vital signs in last 24 hours: Filed Vitals:   10/11/11 0500 10/11/11 0600 10/11/11 0700 10/11/11 0800  BP: 150/77 149/77 139/74 134/73  Pulse: 74 70 71 71  Temp: 101.1 F (38.4 C) 101.3 F (38.5 C) 100.9 F (38.3 C) 100.4 F (38 C)  TempSrc:      Resp: 16 41 34 39  Height:      Weight: 97.5 kg (214 lb 15.2 oz)     SpO2: 100% 100% 100% 100%    Intake/Output from previous day: 11/30 0701 - 12/01 0700 In: 2902.5 [I.V.:760; WU/JW:1191; IV Piggyback:307.5] Out: 2000 [Urine:2000] Intake/Output this shift:    Physical examination: patient is not opening eyes to voice or pain pupils show an anisocoria the right pupil is 4 mm left pupil is 3 mm both are reactive to light. Patient has nonpurposeful movements of all 4 extremities to deep pain. He is not following commands.  CBC  Basename 10/11/11 0348 10/10/11 0415  WBC 8.3 9.0  HGB 11.7* 11.4*  HCT 33.8* 33.4*  PLT 146* 121*   BMET  Basename 10/11/11 0348 10/10/11 0415  NA 139 141  K 3.7 3.8  CL 106 107  CO2 24 24  GLUCOSE 187* 167*  BUN 18 15  CREATININE 0.73 0.71  CALCIUM 8.2* 8.3*    Studies/Results: Ct Head Wo Contrast  10/09/2011  *RADIOLOGY REPORT*  Clinical Data: Subdural hematoma status post craniotomy.  CT HEAD WITHOUT CONTRAST  Technique:  Contiguous axial images were obtained from the base of the skull through the vertex without contrast.  Comparison: CT angiogram 10/08/2011 at [facility].  Findings: The patient is now status post left frontal craniotomy for evacuation of a left subdural hematoma.  There is some hyperdensity persisting along the dura with a small extra-axial fluid collection over the left temporal and frontal convexity. Moderate pneumocephalus is evident bilaterally.  There is significant improvement in midline shift, now measuring 5 mm,  compared with 10-12 mm previously.   A focal area of hypoattenuation adjacent to the left caudate head persists without significant change.  There is partial re-expansion of the left lateral ventricle.  There is no hydrocephalus.  Note is made of blood layering along the tentorium, as before.  Focal parenchymal hemorrhage in the right frontal lobe is stable.  No new hemorrhage is evident.  There is no evidence for acute infarct or mass lesion.  Near total opacification of the maxillary sinuses bilaterally as well as scattered ethmoid air cell disease and an air-fluid level in the left frontal sinus is noted.  This represents progression of sinus disease since the prior studies.  IMPRESSION:  1.  Interval craniotomy with evacuation of the left subdural hematoma. 2.  Some residual extra-axial fluid on the left with minimal blood products along the dura. 3.  Improved midline shift, now measuring 5 mm. 4.  Stable parenchymal hemorrhage in the anterior right frontal lobe.  Original Report Authenticated By: Jamesetta Orleans. MATTERN, M.D.   Dg Chest Port 1 View  10/10/2011  *RADIOLOGY REPORT*  Clinical Data: Endotracheal tube placement  PORTABLE CHEST - 1 VIEW  Comparison: 10/09/2011  Findings: Endotracheal and orogastric tubes appropriately positioned. Bilateral lower lobe atelectasis with small left pleural effusion again noted.    Heart size is normal.  Cardiac leads obscure detail.  IMPRESSION: No significant change in the left  lower lobe small effusion and compressive atelectasis versus superimposed pneumonia.  Original Report Authenticated By: Harrel Lemon, M.D.    Assessment/Plan: Patient continues in comatose state. Plan is to continue supportive care including nutrition and he will need to continue on the ventilator for support he is not a candidate for weaning or extubation. We will plan on a repeat CT the brain without contrast on Monday morning (December 3).  I did have an opportunity speak with  his wife who is present regarding his conditions and her plans for care.   Hewitt Shorts, MD 10/11/2011, 8:38 AM

## 2011-10-12 ENCOUNTER — Inpatient Hospital Stay (HOSPITAL_COMMUNITY): Payer: Medicare Other

## 2011-10-12 DIAGNOSIS — R4182 Altered mental status, unspecified: Secondary | ICD-10-CM

## 2011-10-12 LAB — CBC
MCH: 31.9 pg (ref 26.0–34.0)
MCV: 91.8 fL (ref 78.0–100.0)
Platelets: 132 10*3/uL — ABNORMAL LOW (ref 150–400)
RBC: 3.67 MIL/uL — ABNORMAL LOW (ref 4.22–5.81)
RDW: 12.7 % (ref 11.5–15.5)
WBC: 7.2 10*3/uL (ref 4.0–10.5)

## 2011-10-12 LAB — BASIC METABOLIC PANEL
CO2: 25 mEq/L (ref 19–32)
Calcium: 8.3 mg/dL — ABNORMAL LOW (ref 8.4–10.5)
Creatinine, Ser: 0.65 mg/dL (ref 0.50–1.35)
GFR calc Af Amer: >90 mL/min (ref >90)
GFR calc non Af Amer: >90 mL/min (ref >90)
Sodium: 136 mEq/L (ref 135–145)

## 2011-10-12 LAB — BLOOD GAS, ARTERIAL
Acid-Base Excess: 0.3 mmol/L (ref 0.0–2.0)
Bicarbonate: 24.2 mEq/L — ABNORMAL HIGH (ref 20.0–24.0)
Drawn by: 13898
FIO2: 30 %
O2 Saturation: 98.8 %
TCO2: 25.3 mmol/L (ref 0–100)
pCO2 arterial: 37.6 mmHg (ref 35.0–45.0)
pO2, Arterial: 118 mmHg — ABNORMAL HIGH (ref 80.0–100.0)

## 2011-10-12 LAB — GLUCOSE, CAPILLARY: Glucose-Capillary: 162 mg/dL — ABNORMAL HIGH (ref 70–99)

## 2011-10-12 LAB — CULTURE, RESPIRATORY W GRAM STAIN

## 2011-10-12 MED ORDER — SODIUM CHLORIDE 0.9 % IJ SOLN
10.0000 mL | INTRAMUSCULAR | Status: DC | PRN
  Administered 2011-10-13: 30 mL
  Administered 2011-10-13 – 2011-10-23 (×6): 10 mL

## 2011-10-12 MED ORDER — POTASSIUM CHLORIDE 20 MEQ/15ML (10%) PO LIQD
40.0000 meq | Freq: Once | ORAL | Status: AC
  Administered 2011-10-12: 40 meq
  Filled 2011-10-12: qty 30

## 2011-10-12 MED ORDER — SODIUM CHLORIDE 0.9 % IJ SOLN
10.0000 mL | Freq: Two times a day (BID) | INTRAMUSCULAR | Status: DC
  Administered 2011-10-12 – 2011-10-20 (×13): 10 mL

## 2011-10-12 NOTE — Progress Notes (Signed)
Stroke Team Progress Note  SUBJECTIVE  [name] is a 70 y.o. male who appears more arousable this morning. He partially opens eyes. He blinks to threat and pupils are 4 mm reactive. He has spontaneous movements in both upper extremities right more than left and to a lesser degree in the lower extremities. Is not following commands.stable   OBJECTIVE Most recent Vital Signs: Temp: 99.7 F (37.6 C) (12/02 1000) Temp src: Core (Comment) (12/02 0845) BP: 156/79 mmHg (12/02 1000) Pulse Rate: 69  (12/02 1000) Respiratory Rate: 27 O2 Saturdation: 100%  CBG (last 3)   Basename 10/12/11 0801 10/12/11 0401 10/11/11 2322  GLUCAP 157* 162* 132*    Diet:   panda tube feeds  Activity: bedrest  VTE Prophylaxis: scds  Studies: Results for orders placed during the hospital encounter of 10/07/11 (from the past 24 hour(s))  GLUCOSE, CAPILLARY     Status: Abnormal   Collection Time   10/11/11 11:39 AM      Component Value Range   Glucose-Capillary 157 (*) 70 - 99 (mg/dL)  GLUCOSE, CAPILLARY     Status: Abnormal   Collection Time   10/11/11  3:29 PM      Component Value Range   Glucose-Capillary 157 (*) 70 - 99 (mg/dL)  GLUCOSE, CAPILLARY     Status: Abnormal   Collection Time   10/11/11  8:55 PM      Component Value Range   Glucose-Capillary 146 (*) 70 - 99 (mg/dL)  GLUCOSE, CAPILLARY     Status: Abnormal   Collection Time   10/11/11 11:22 PM      Component Value Range   Glucose-Capillary 132 (*) 70 - 99 (mg/dL)  GLUCOSE, CAPILLARY     Status: Abnormal   Collection Time   10/12/11  4:01 AM      Component Value Range   Glucose-Capillary 162 (*) 70 - 99 (mg/dL)  CBC     Status: Abnormal   Collection Time   10/12/11  4:10 AM      Component Value Range   WBC 7.2  4.0 - 10.5 (K/uL)   RBC 3.67 (*) 4.22 - 5.81 (MIL/uL)   Hemoglobin 11.7 (*) 13.0 - 17.0 (g/dL)   HCT 16.1 (*) 09.6 - 52.0 (%)   MCV 91.8  78.0 - 100.0 (fL)   MCH 31.9  26.0 - 34.0 (pg)   MCHC 34.7  30.0 - 36.0  (g/dL)   RDW 04.5  40.9 - 81.1 (%)   Platelets 132 (*) 150 - 400 (K/uL)  BASIC METABOLIC PANEL     Status: Abnormal   Collection Time   10/12/11  4:10 AM      Component Value Range   Sodium 136  135 - 145 (mEq/L)   Potassium 3.3 (*) 3.5 - 5.1 (mEq/L)   Chloride 103  96 - 112 (mEq/L)   CO2 25  19 - 32 (mEq/L)   Glucose, Bld 177 (*) 70 - 99 (mg/dL)   BUN 17  6 - 23 (mg/dL)   Creatinine, Ser 9.14  0.50 - 1.35 (mg/dL)   Calcium 8.3 (*) 8.4 - 10.5 (mg/dL)   GFR calc non Af Amer >90  >90 (mL/min)   GFR calc Af Amer >90  >90 (mL/min)  BLOOD GAS, ARTERIAL     Status: Abnormal   Collection Time   10/12/11  4:52 AM      Component Value Range   FIO2 30.00     Delivery systems VENTILATOR  Mode PRESSURE REGULATED VOLUME CONTROL     VT 620     Rate 12     Peep/cpap 5.0     pH, Arterial 7.424  7.350 - 7.450    pCO2 arterial 37.6  35.0 - 45.0 (mmHg)   pO2, Arterial 118.0 (*) 80.0 - 100.0 (mmHg)   Bicarbonate 24.2 (*) 20.0 - 24.0 (mEq/L)   TCO2 25.3  0 - 100 (mmol/L)   Acid-Base Excess 0.3  0.0 - 2.0 (mmol/L)   O2 Saturation 98.8     Patient temperature 98.6     Collection site A-LINE     Drawn by [phone]     Sample type ARTERIAL DRAW    GLUCOSE, CAPILLARY     Status: Abnormal   Collection Time   10/12/11  8:01 AM      Component Value Range   Glucose-Capillary 157 (*) 70 - 99 (mg/dL)     Dg Chest Port 1 View  10/12/2011  *RADIOLOGY REPORT*  Clinical Data: Intubated.  PORTABLE CHEST - 1 VIEW  Comparison: 10/11/2011  Findings: Endotracheal tube is unchanged.  Heart is normal size. Bibasilar atelectasis, left slightly greater than right, slightly improved since prior study.  No effusions.  IMPRESSION: Slight improvement in bibasilar atelectasis.  Original Report Authenticated By: Cyndie Chime, M.D.   Dg Chest Port 1 View  10/11/2011  *RADIOLOGY REPORT*  Clinical Data: Respiratory failure and shortness of breath  PORTABLE CHEST - 1 VIEW  Comparison: October 10, 2011  Findings: The ET  tube tip remains well above the carina.  The nasogastric tube courses off the inferior film.  Left lower lobe opacity appears slightly improved.  Plate-like atelectasis in the right lower lung remains present.  IMPRESSION: Slight interval improvement in appearance of left lower lobe opacity.  Original Report Authenticated By: Brandon Melnick, M.D.    Physical Exam:    Intubated. On ventilator. Not waking up. No agitation. Localizing on the right. Left side hemiplegic. Heart rate regular. Breath sounds clear. No seizure activity noted. Left eye Swelling is less today and can easily be opened  Neurological exam drowsy but arouses today and opens eyes partially. Pupils 4 mm reactive. Corneal reflexes present. Doll's eye movements are sluggish.not following commands Purposeful withdrawal of the right upper and lower extremity to pain. Moves left upper extremity less. Does withdraw the left lower extremity to pain.able to maintain tone in both lower extremities against gravity Both plantars are equivocal  ASSESSMENT [name] is a 70 y.o. male with a large L SDH with left SDH and 1.4 cm left to right shift with subfalcine And uncal herniation and cerebral edema post evacuation by Dr. Venetia Maxon. Hemorrhage secondary to ruptured AVM from coughing.  He is beginning to wake up now and localize which is encouraging sign.  Hospital day # 5  TREATMENT/PLAN Continue supportive care. Restrain his hands to avoid self extubation. Repeat CT head tomorrow morning. I had a long discussion with the patient's sister unanswered questions.This patient is critically ill and at significant risk of neurological worsening, death and care requires constant monitoring of vital signs, hemodynamics,respiratory and cardiac monitoring, neurological assessment, discussion with family, other specialists and medical decision making of high complexity. I spent 30 minutes of neurocritical care time  in the care of  this patient.    Gates Rigg, MD [facility] Pager: [phone] 10/12/2011 10:34 AM

## 2011-10-12 NOTE — Progress Notes (Signed)
Subjective:  Patient continues on the ventilator via endotracheal tube. He continues on tube feedings. He is not receiving any sedation.  Objective: Vital signs in last 24 hours: Filed Vitals:   10/12/11 0700 10/12/11 0800 10/12/11 0845 10/12/11 0900  BP: 145/74 137/72 217/89 146/76  Pulse: 64 57 81 64  Temp: 99.5 F (37.5 C) 99.5 F (37.5 C) 99.7 F (37.6 C) 99.7 F (37.6 C)  TempSrc:  Core (Comment) Core (Comment)   Resp: 14 15 24 22   Height:      Weight:      SpO2: 100% 100% 100% 100%    Intake/Output from previous day: 12/01 0701 - 12/02 0700 In: 2945 [I.V.:430; NG/GT:2100; IV Piggyback:415] Out: 1835 [Urine:1835] Intake/Output this shift: Total I/O In: 210 [I.V.:40; NG/GT:170] Out: 60 [Urine:60]  Physical Exam:  To vigorous stimulation the patient opens his eyes and follows some simple commands weakly he seems to move all 4 extremities pupils are 4 mm round and reactive to light although at times there is a slight anisocoria with the right pupil being about a millimeter larger than the left pupil.   CBC  Basename 10/12/11 0410 10/11/11 0348  WBC 7.2 8.3  HGB 11.7* 11.7*  HCT 33.7* 33.8*  PLT 132* 146*   BMET  Basename 10/12/11 0410 10/11/11 0348  NA 136 139  K 3.3* 3.7  CL 103 106  CO2 25 24  GLUCOSE 177* 187*  BUN 17 18  CREATININE 0.65 0.73  CALCIUM 8.3* 8.2*      Assessment/Plan: Patient gradually improving neurologically. We'll check CT brain without and a.m. and continue to monitor neurologically. I feel the patient needs to continue on ventilator support he is not nearly alert enough to consider weaning and extubation.   Hewitt Shorts, MD 10/12/2011, 10:13 AM

## 2011-10-12 NOTE — Progress Notes (Signed)
Tylenol given

## 2011-10-12 NOTE — Progress Notes (Signed)
Name: Gregory Humphrey MRN: [id number] DOB: 11-10-1941  DOS: 10/07/2011    LOS: 5  CRITICAL CARE CONSULT FOLLOWUP NOTE  History of Present Illness: 70 y/o man with h/o HTN brought to [facility] ED unresponsive after complaining of headache earlier.  Intubated for airway protection.  Head CT demonstrated large acute subdural, subarachnoid and parenchimal hemorrhage. OR crani 11/28.  Lines / Drains: 11/27  ETT>>> 11/27  Foley>>> A line rt rad 11/28 >>>12/2  Cultures / Sepsis markers: Sputum 11.28 >>> strep pneumo, awaiting sensitivities  Antibiotics: (concern CAP  Vs asp) Ceftriaxone 11/28 >>> levoflox 11/28 >>12/2  Tests / Events: 11/27  Found unresponsive.  Head CT demonstrated large acute subdural, subarachnoid and parenchimal hemorrhage. 11/28- poor neuro exam 11/28- OR drain SDH 11/28- CT angio, no aneursym 11/28 Craniotomy for spontaneous SDH evacuation  Vital Signs:  BP 156/79  Pulse 69  Temp(Src) 99.7 F (37.6 C) (Core (Comment))  Resp 27  Ht 5\' 10"  (1.778 m)  Wt 98.5 kg (217 lb 2.5 oz)  BMI 31.16 kg/m2  SpO2 100% I/O last 3 completed shifts: In: 4302.5 [I.V.:670; NG/GT:3110; IV Piggyback:522.5] Out: 2945 [Urine:2945] Total I/O In: 315 [I.V.:60; NG/GT:255] Out: 60 [Urine:60]   Physical Examination: Gen: on vent, no sedation, no acute distress HEENT: head dressing in place, pupils 2-3 mm, reactive bilat, ett in place PULM: rhonchi R > L CV: RRR, no mgr, no JVD AB: BS+, soft, nontender, no hsm Ext: warm, pitting edema, no clubbing, no cyanosis Derm: no rash or skin breakdown Neuro: somnolent on vent, spontaneous movements all over,   Labs and Imaging:  Reviewed.  Please refer to the Assessment and Plan section for relevant results.  CBC    Component Value Date/Time   WBC 7.2 10/12/2011 0410   RBC 3.67* 10/12/2011 0410   HGB 11.7* 10/12/2011 0410   HCT 33.7* 10/12/2011 0410   PLT 132* 10/12/2011 0410   MCV 91.8 10/12/2011 0410   MCH 31.9 10/12/2011 0410   MCHC  34.7 10/12/2011 0410   RDW 12.7 10/12/2011 0410   LYMPHSABS 0.9 10/10/2011 0415   MONOABS 0.9 10/10/2011 0415   EOSABS 0.0 10/10/2011 0415   BASOSABS 0.0 10/10/2011 0415    BMET    Component Value Date/Time   NA 136 10/12/2011 0410   K 3.3* 10/12/2011 0410   CL 103 10/12/2011 0410   CO2 25 10/12/2011 0410   GLUCOSE 177* 10/12/2011 0410   BUN 17 10/12/2011 0410   CREATININE 0.65 10/12/2011 0410   CALCIUM 8.3* 10/12/2011 0410   GFRNONAA >90 10/12/2011 0410   GFRAA >90 10/12/2011 0410    arterial blood gases: 7.42/37.6/118  CXR reviewed: support apparatus in place, no acute changes  Assessment and Plan: 1) Large acute subdural hemorrhage with herniation. Now s/p crani, drainage Mgmt per NS and Neuro. Exam slowly improving today  2) Seizures concern keprra increased per neurology  3) Acute ventilator dependent respiratory failure due to neuro status --Cont full support for now, no SBT  --ABG/CXR in AM  4) Possible PNA - fevers likely central given wbc count, unimpressive cxr  -change to ceftriaxone monotherapy  5) HTN emergency: BP OK off nicardipine, a-line inaccurate -d/c a-line  6) malnurished - Cont TFs  7) IV access -picc  Best practices / Disposition: -->ICU status under PCCM, Neurosurgery consulted -->DNR, aggressive care otherwise, reassess first of next week, family well informed and insightful -->SCDs for DVT Px -->Protonix for GI Px -->ventilator bundle -->Start TF 11/29 -->family updated at bedside  The patient is critically ill with multiple organ systems failure and requires high complexity decision making for assessment and support, frequent evaluation and titration of therapies, application of advanced monitoring technologies and extensive interpretation of multiple databases. Critical Care Time devoted to patient care services described in this note is 30 minutes.  MCQUAID, DOUGLAS  10/12/2011, 10:41 AM

## 2011-10-13 ENCOUNTER — Inpatient Hospital Stay (HOSPITAL_COMMUNITY): Payer: Medicare Other

## 2011-10-13 DIAGNOSIS — J9600 Acute respiratory failure, unspecified whether with hypoxia or hypercapnia: Secondary | ICD-10-CM

## 2011-10-13 DIAGNOSIS — I629 Nontraumatic intracranial hemorrhage, unspecified: Secondary | ICD-10-CM

## 2011-10-13 DIAGNOSIS — R4182 Altered mental status, unspecified: Secondary | ICD-10-CM

## 2011-10-13 LAB — GLUCOSE, CAPILLARY
Glucose-Capillary: 140 mg/dL — ABNORMAL HIGH (ref 70–99)
Glucose-Capillary: 148 mg/dL — ABNORMAL HIGH (ref 70–99)
Glucose-Capillary: 158 mg/dL — ABNORMAL HIGH (ref 70–99)

## 2011-10-13 LAB — BASIC METABOLIC PANEL
GFR calc Af Amer: >90 mL/min (ref >90)
GFR calc non Af Amer: >90 mL/min (ref >90)
Potassium: 3.9 mEq/L (ref 3.5–5.1)
Sodium: 138 mEq/L (ref 135–145)

## 2011-10-13 LAB — BLOOD GAS, ARTERIAL
Bicarbonate: 25.3 mEq/L — ABNORMAL HIGH (ref 20.0–24.0)
PEEP: 5 cmH2O
Patient temperature: 99.3
pH, Arterial: 7.45 (ref 7.350–7.450)

## 2011-10-13 LAB — CBC
MCHC: 34.7 g/dL (ref 30.0–36.0)
RDW: 12.6 % (ref 11.5–15.5)

## 2011-10-13 MED ORDER — FENTANYL CITRATE 0.05 MG/ML IJ SOLN: 200.0000 ug | Freq: Once | INTRAMUSCULAR | Status: DC

## 2011-10-13 MED ORDER — ETOMIDATE 2 MG/ML IV SOLN
40.0000 mg | Freq: Once | INTRAVENOUS | Status: DC
  Filled 2011-10-13: qty 10
  Filled 2011-10-13: qty 20

## 2011-10-13 MED ORDER — VECURONIUM BROMIDE 10 MG IV SOLR
10.0000 mg | Freq: Once | INTRAVENOUS | Status: DC
  Filled 2011-10-13: qty 10

## 2011-10-13 MED ORDER — PROPOFOL 10 MG/ML IV EMUL
5.0000 ug/kg/min | Freq: Once | INTRAVENOUS | Status: DC
  Filled 2011-10-13: qty 100

## 2011-10-13 MED ORDER — MIDAZOLAM HCL 5 MG/ML IJ SOLN: 5.0000 mg | Freq: Once | INTRAMUSCULAR | Status: DC

## 2011-10-13 NOTE — Progress Notes (Signed)
eLink Physician-Brief Progress Note Patient Name: Gregory Humphrey DOB: 1941-01-02 MRN: [id number]  Date of Service  10/13/2011   HPI/Events of Note   Rt picc line is coiled at IJ level. Notes say they are planning long term care  eICU Interventions  IR consult for reposition   Intervention Category Major Interventions: Hypotension - evaluation and management Intermediate Interventions: Diagnostic test evaluation  Gregory Humphrey 10/13/2011, 3:28 PM

## 2011-10-13 NOTE — Progress Notes (Signed)
Stroke Team Progress Note  SUBJECTIVE [name] is a 70 y.o. male whose mental status appears to be minimally improving. Wife at the bedside. Discussed plan for the week with Dr. Pearlean Brownie and wife. Wife leaning toward trach and PEG.  OBJECTIVE Most recent Vital Signs: Temp: 99.7 F (37.6 C) (12/03 0800) BP: 143/92 mmHg (12/03 0800) Pulse Rate: 64  (12/03 0800) Respiratory Rate: 16 O2 Saturdation: 99%  CBG (last 3)   Basename 10/13/11 0340 10/12/11 2333 10/12/11 0801  GLUCAP 140* 148* 157*   Intake/Output from previous day: 12/02 0701 - 12/03 0700 In: 2561.8 [I.V.:426.8; NG/GT:1870; IV Piggyback:265] Out: 2150 [Urine:2150]  IV Fluid Intake     . sodium chloride 20 mL/hr at 10/13/11 0800  . feeding supplement (PROMOTE) 1,000 mL (10/12/11 1600)  . niCARDipine Stopped (10/09/11 0258)  . propofol Stopped (10/09/11 0915)   Diet    NPO, tube feedings  Activity  Bedrest  DVT Prophylaxis  SCDs   Studies CBC     Status: Abnormal   Collection Time   10/13/11  3:40 AM      Component Value Range   WBC 6.9  4.0 - 10.5 (K/uL)   RBC 4.02 (*) 4.22 - 5.81 (MIL/uL)   Hemoglobin 12.7 (*) 13.0 - 17.0 (g/dL)   HCT 30.8 (*) 65.7 - 52.0 (%)   MCV 91.0  78.0 - 100.0 (fL)   MCH 31.6  26.0 - 34.0 (pg)   MCHC 34.7  30.0 - 36.0 (g/dL)   RDW 84.6  96.2 - 95.2 (%)   Platelets 148 (*) 150 - 400 (K/uL)  BASIC METABOLIC PANEL     Status: Abnormal   Collection Time   10/13/11  3:40 AM      Component Value Range   Sodium 138  135 - 145 (mEq/L)   Potassium 3.9  3.5 - 5.1 (mEq/L)   Chloride 103  96 - 112 (mEq/L)   CO2 27  19 - 32 (mEq/L)   Glucose, Bld 154 (*) 70 - 99 (mg/dL)   BUN 19  6 - 23 (mg/dL)   Creatinine, Ser 8.41  0.50 - 1.35 (mg/dL)   Calcium 8.5  8.4 - 32.4 (mg/dL)   GFR calc non Af Amer >90  >90 (mL/min)   GFR calc Af Amer >90  >90 (mL/min)  BLOOD GAS, ARTERIAL     Status: Abnormal   Collection Time   10/13/11  4:05 AM      Component Value Range   FIO2 .30     Delivery systems VENTILATOR     Mode PRESSURE REGULATED VOLUME CONTROL     VT 620     Rate 12     Peep/cpap 5.0     pH, Arterial 7.450  7.350 - 7.450    pCO2 arterial 37.1  35.0 - 45.0 (mmHg)   pO2, Arterial 107.0 (*) 80.0 - 100.0 (mmHg)   Bicarbonate 25.3 (*) 20.0 - 24.0 (mEq/L)   TCO2 26.4  0 - 100 (mmol/L)   Acid-Base Excess 1.7  0.0 - 2.0 (mmol/L)   O2 Saturation 98.8     Patient temperature 99.3     Collection site LEFT RADIAL     Drawn by [id number]     Sample type ARTERIAL DRAW     Allens test (pass/fail) PASS  PASS      Chest Port 1 View 10/13/2011  Stable endotracheal tube.  Stable bibasilar atelectasis.  Malpositioned right PICC.  It is looped in the right internal jugular  vein.   10/12/2011 Slight improvement in bibasilar atelectasis.    Ct Portable Head W/o Cm 10/13/2011 The left sided subdural hematoma appears more dense than on the prior exam.  This is consistent with reaccumulation of acute hemorrhage and / or clotting blood.  Overall maximal thickness of 12.5 mm versus prior 11.3 mm.  Mass effect upon the left lateral ventricle is midline shift to the right by 4.2 mm without significant change.  Left basal ganglia and subacute/acute infarct is better defined.  Remote left corona radiata infarcts.  Small areas of residual subarachnoid hemorrhage.  Right frontal lobe hemorrhagic contusion is less well defined.  Pneumocephalus has decreased in the frontal lobe region.  Scalp fluid and air collection once again noted.    Physical Exam  Intubated. On ventilator. Not waking up. No agitation. Localizing on the right. Left side hemiplegic. Heart rate regular. Breath sounds clear. No seizure activity noted. Left eye Swelling is less today and can easily be opened  Neurological exam drowsy but arouses today and opens eyes partially. Pupils 4 mm reactive. Corneal reflexes present. Doll's eye movements are sluggish.not following commands Purposeful withdrawal of the right upper and lower  extremity to pain. Moves left upper extremity less. Does withdraw both lower extremity to pain minimally.. Not able to maintain tone in both lower extremities against gravity Both plantars are equivocal  ASSESSMENT [name] is a 70 y.o. male with a large  left SDH and 1.4 cm left to right shift with subfalcine and uncal herniation and cerebral edema post evacuation by Dr. Venetia Maxon. Hemorrhage secondary to ruptured smal left pareital cortical small AVM from coughing. He is beginning to wake up a little and localizing which is encouraging sign. Still unclear as to degree of recovery and eventual quality of life.. Wife leaning toward trach and PEG this week given some improvement over the weekend and I agree..   Stroke risk factors:  family history and hypertension  Hospital day # 6  TREATMENT/PLAN EEG tomorrow. Plan trach and PEG later this week.D/W wife and Dr Delton Coombes. This patient is critically ill and at significant risk of neurological worsening, death and care requires constant monitoring of vital signs, hemodynamics,respiratory and cardiac monitoring, neurological assessment, discussion with family, other specialists and medical decision making of high complexity. I spent 30 minutes of neurocritical care time  in the care of  this patient.   [name], ANP-BC, GNP-BC [facility] Pager: [phone] 10/13/2011 8:40 AM  Dr. Delia Heady, Stroke Center Medical Director, has personally reviewed chart, pertinent data, examined the patient and developed the plan of care.

## 2011-10-13 NOTE — Progress Notes (Signed)
Name: Gregory Humphrey MRN: [id number] DOB: Mar 25, 1941  DOS: 10/07/2011    LOS: 6  CRITICAL CARE CONSULT FOLLOWUP NOTE  History of Present Illness: 70 y/o man with h/o HTN brought to [facility] ED unresponsive after complaining of headache earlier.  Intubated for airway protection.  Head CT demonstrated large acute subdural, subarachnoid and parenchimal hemorrhage. OR crani 11/28.  Lines / Drains: 11/27  ETT>>> 11/27  Foley>>> A line rt rad 11/28 >>>12/2  Cultures / Sepsis markers: Sputum 11.28 >>> strep pneumo, awaiting sensitivities  Antibiotics: (concern CAP  Vs asp) Ceftriaxone 11/28 >>> levoflox 11/28 >>12/2  Tests / Events: 11/27  Found unresponsive.  Head CT demonstrated large acute subdural, subarachnoid and parenchimal hemorrhage. 11/28- poor neuro exam 11/28- OR drain SDH 11/28- CT angio, no aneursym 11/28 Craniotomy for spontaneous SDH evacuation  Vital Signs:  BP 155/88  Pulse 68  Temp(Src) 99.7 F (37.6 C) (Core (Comment))  Resp 21  Ht 5\' 10"  (1.778 m)  Wt 95.5 kg (210 lb 8.6 oz)  BMI 30.21 kg/m2  SpO2 99% I/O last 3 completed shifts: In: 3929.3 [I.V.:666.8; NG/GT:2890; IV Piggyback:372.5] Out: 3075 [Urine:3075] Total I/O In: 210 [I.V.:40; NG/GT:170] Out: 40 [Urine:40]   Physical Examination: Gen: on vent, no sedation, no acute distress HEENT: head dressing in place, pupils 2-3 mm, reactive bilat, ett in place PULM: rhonchi R > L CV: RRR, no mgr, no JVD AB: BS+, soft, nontender, no hsm Ext: warm, pitting edema, no clubbing, no cyanosis Derm: no rash or skin breakdown Neuro: somnolent on vent, spontaneous movements all over,    CBC    Component Value Date/Time   WBC 6.9 10/13/2011 0340   RBC 4.02* 10/13/2011 0340   HGB 12.7* 10/13/2011 0340   HCT 36.6* 10/13/2011 0340   PLT 148* 10/13/2011 0340   MCV 91.0 10/13/2011 0340   MCH 31.6 10/13/2011 0340   MCHC 34.7 10/13/2011 0340   RDW 12.6 10/13/2011 0340   LYMPHSABS 0.9 10/10/2011 0415   MONOABS 0.9  10/10/2011 0415   EOSABS 0.0 10/10/2011 0415   BASOSABS 0.0 10/10/2011 0415    BMET    Component Value Date/Time   NA 138 10/13/2011 0340   K 3.9 10/13/2011 0340   CL 103 10/13/2011 0340   CO2 27 10/13/2011 0340   GLUCOSE 154* 10/13/2011 0340   BUN 19 10/13/2011 0340   CREATININE 0.71 10/13/2011 0340   CALCIUM 8.5 10/13/2011 0340   GFRNONAA >90 10/13/2011 0340   GFRAA >90 10/13/2011 0340   ABG    Component Value Date/Time   PHART 7.450 10/13/2011 0405   PCO2ART 37.1 10/13/2011 0405   PO2ART 107.0* 10/13/2011 0405   HCO3 25.3* 10/13/2011 0405   TCO2 26.4 10/13/2011 0405   ACIDBASEDEF 1.0 10/09/2011 0550   O2SAT 98.8 10/13/2011 0405      CXR reviewed: Dg Chest Port 1 View  10/13/2011  *RADIOLOGY REPORT*  Clinical Data: Respiratory difficulty  PORTABLE CHEST - 1 VIEW  Comparison: Yesterday  Findings: Normal heart size.  Endotracheal tube remains with its tip 4.4 cm from the carina. Stable NG tube beyond the gastroesophageal junction.  Stable bibasilar atelectasis.  No pneumothorax.  Right PICC placed.  It is looped in the right internal jugular vein with its tip at the confluence of the right internal jugular vein and right subclavian vein.  IMPRESSION: Stable endotracheal tube.  Stable bibasilar atelectasis.  Malpositioned right PICC.  It is looped in the right internal jugular vein.  Original Report Authenticated By: Merton Border  Joseph Art, M.D.   Dg Chest Port 1 View  10/12/2011  *RADIOLOGY REPORT*  Clinical Data: Intubated.  PORTABLE CHEST - 1 VIEW  Comparison: 10/11/2011  Findings: Endotracheal tube is unchanged.  Heart is normal size. Bibasilar atelectasis, left slightly greater than right, slightly improved since prior study.  No effusions.  IMPRESSION: Slight improvement in bibasilar atelectasis.  Original Report Authenticated By: Cyndie Chime, M.D.   Ct Portable Head W/o Cm  10/13/2011  *RADIOLOGY REPORT*  Clinical Data: Followup the subdural hematoma drainage.  CT HEAD WITHOUT CONTRAST   Technique:  Contiguous axial images were obtained from the base of the skull through the vertex without contrast.  Comparison: 10/09/2011.  Findings: Post left frontal - parietal craniotomy for drainage of left-sided subdural hematoma.  The degree of pneumocephalus within the frontal region has decreased.  Subcutaneous hematoma and air remain.  The left sided subdural hematoma appears more dense than on the prior exam.  This is consistent with reaccumulation of acute hemorrhage and / or clotting blood.  Overall maximal thickness of 12.5 mm versus prior 11.3 mm.  Mass effect upon the left lateral ventricle is midline shift to the right by 4.2 mm without significant change.  Underlying small amount of subarachnoid hemorrhage.  Anterior medial right frontal blood collection with surrounding vasogenic edema may represent result of small parenchymal hemorrhage.  Left basal ganglia subacute/acute infarct is better defined.  Remote left corona radiata small infarcts.  Streak artifact of the posterior fossa.  Ectatic intracranial vasculature.  Opacification paranasal sinuses.  IMPRESSION: The left sided subdural hematoma appears more dense than on the prior exam.  This is consistent with reaccumulation of acute hemorrhage and / or clotting blood.  Overall maximal thickness of 12.5 mm versus prior 11.3 mm.  Mass effect upon the left lateral ventricle is midline shift to the right by 4.2 mm without significant change.  Left basal ganglia and subacute/acute infarct is better defined.  Remote left corona radiata infarcts.  Small areas of residual subarachnoid hemorrhage.  Right frontal lobe hemorrhagic contusion is less well defined.  Pneumocephalus has decreased in the frontal lobe region.  Scalp fluid and air collection once again noted.  Initial interpretation by Dr. Cherly Hensen 10/13/2011 4:59 a.m.  Original Report Authenticated By: Fuller Canada, M.D.     Assessment and Plan: 1) Large acute subdural hemorrhage with  herniation. Now s/p crani, drainage.  Exam slowly improving. Dr Pearlean Brownie has discussed prognosis and goals with pt's wife, planning for extended support to see if he can have a significant neuro recovery  2) Seizures concern -- keppra increased per neurology  3) Acute ventilator dependent respiratory failure due to neuro status --Cont full support, will require tracheostomy to allow extended support to even assess degree of neuro and resp recovery --ABG/CXR in AM  4) Possible PNA - fevers likely central given wbc count, unimpressive cxr --changed to ceftriaxone monotherapy, plan total duration abx 7 days  5) HTN emergency: BP OK off nicardipine  6) malnurished - Cont TFs  7) IV access; PICC malpositioned -picc will need to be repositioned  Best practices / Disposition: -->ICU status under PCCM, Neurosurgery and Neuro following -->DNR, aggressive care otherwise, reassess first of next week, family well informed and insightful -->SCDs for DVT Px -->Protonix for GI Px -->TF started 11/29, will need PEG to facilitate prolonged care -->family updated at bedside  The patient is critically ill with multiple organ systems failure and requires high complexity decision making for assessment and  support, frequent evaluation and titration of therapies, application of advanced monitoring technologies and extensive interpretation of multiple databases. Critical Care Time devoted to patient care services described in this note is 30 minutes.  Khamia Stambaugh S. 10/13/2011, 9:38 AM

## 2011-10-13 NOTE — Progress Notes (Signed)
Subjective: Patient reports still intubated, some eye opening  Objective: Vital signs in last 24 hours: Temp:  [99.1 F (37.3 C)-101.3 F (38.5 C)] 99.7 F (37.6 C) (12/03 0600) Pulse Rate:  [57-81] 65  (12/03 0600) Resp:  [13-27] 14  (12/03 0600) BP: (137-217)/(70-89) 141/78 mmHg (12/03 0600) SpO2:  [97 %-100 %] 99 % (12/03 0600) FiO2 (%):  [29.8 %-30.2 %] 30.2 % (12/03 0600) Weight:  [95.5 kg (210 lb 8.6 oz)] 210 lb 8.6 oz (95.5 kg) (12/03 0500)  Intake/Output from previous day: 12/02 0701 - 12/03 0700 In: 2349.3 [I.V.:406.8; ZO/XW:9604; IV Piggyback:157.5] Out: 2150 [Urine:2150] Intake/Output this shift: Total I/O In: 1045 [I.V.:195; NG/GT:850] Out: 1095 [Urine:1095]  Physical Exam: Opening eyes. Not following commands.  Moves right> left side spontaneously  Lab Results:  Basename 10/13/11 0340 10/12/11 0410  WBC 6.9 7.2  HGB 12.7* 11.7*  HCT 36.6* 33.7*  PLT 148* 132*   BMET  Basename 10/13/11 0340 10/12/11 0410  NA 138 136  K 3.9 3.3*  CL 103 103  CO2 27 25  GLUCOSE 154* 177*  BUN 19 17  CREATININE 0.71 0.65  CALCIUM 8.5 8.3*    Studies/Results: Dg Chest Port 1 View  10/12/2011  *RADIOLOGY REPORT*  Clinical Data: Intubated.  PORTABLE CHEST - 1 VIEW  Comparison: 10/11/2011  Findings: Endotracheal tube is unchanged.  Heart is normal size. Bibasilar atelectasis, left slightly greater than right, slightly improved since prior study.  No effusions.  IMPRESSION: Slight improvement in bibasilar atelectasis.  Original Report Authenticated By: Cyndie Chime, M.D.   Dg Chest Port 1 View  10/11/2011  *RADIOLOGY REPORT*  Clinical Data: Respiratory failure and shortness of breath  PORTABLE CHEST - 1 VIEW  Comparison: October 10, 2011  Findings: The ET tube tip remains well above the carina.  The nasogastric tube courses off the inferior film.  Left lower lobe opacity appears slightly improved.  Plate-like atelectasis in the right lower lung remains present.   IMPRESSION: Slight interval improvement in appearance of left lower lobe opacity.  Original Report Authenticated By: Brandon Melnick, M.D.    Assessment/Plan: Slow improvement clinically.  Head CT stable.  Wife wishes to pursue PEG/Trach.    LOS: 6 days    Dorian Heckle, MD 10/13/2011, 6:32 AM

## 2011-10-14 ENCOUNTER — Inpatient Hospital Stay (HOSPITAL_COMMUNITY): Payer: Medicare Other

## 2011-10-14 ENCOUNTER — Telehealth: Payer: Self-pay | Admitting: Family Medicine

## 2011-10-14 LAB — CBC
Hemoglobin: 12 g/dL — ABNORMAL LOW (ref 13.0–17.0)
MCH: 32.3 pg (ref 26.0–34.0)
MCV: 91.6 fL (ref 78.0–100.0)
RBC: 3.71 MIL/uL — ABNORMAL LOW (ref 4.22–5.81)

## 2011-10-14 LAB — GLUCOSE, CAPILLARY
Glucose-Capillary: 134 mg/dL — ABNORMAL HIGH (ref 70–99)
Glucose-Capillary: 164 mg/dL — ABNORMAL HIGH (ref 70–99)
Glucose-Capillary: 136 mg/dL — ABNORMAL HIGH (ref 70–99)
Glucose-Capillary: 112 mg/dL — ABNORMAL HIGH (ref 70–99)

## 2011-10-14 LAB — BASIC METABOLIC PANEL
CO2: 27 mEq/L (ref 19–32)
Chloride: 103 mEq/L (ref 96–112)
Glucose, Bld: 136 mg/dL — ABNORMAL HIGH (ref 70–99)
Potassium: 5 mEq/L (ref 3.5–5.1)
Sodium: 138 mEq/L (ref 135–145)

## 2011-10-14 MED ORDER — FLEXIFLO TOPTAINER FEED SET MISC
Status: AC
  Filled 2011-10-14: qty 1

## 2011-10-14 MED ORDER — MIDAZOLAM HCL 2 MG/2ML IJ SOLN
INTRAMUSCULAR | Status: AC
  Filled 2011-10-14: qty 6

## 2011-10-14 NOTE — Procedures (Signed)
See full dictation to follow Size 8 tracch placed Blood loss 1 cc Tolerated well  Mcarthur Rossetti. Tyson Alias, MD, FACP Pgr: [phone]  Pulmonary & Critical Care

## 2011-10-14 NOTE — Procedures (Signed)
Bronchoscopy Procedure Note GEOVANY TRUDO [id number] 1941/05/24  Procedure: Bronchoscopy Indications: Tracheostomy tube, airway eval.   Procedure Details Consent: Risks of procedure as well as the alternatives and risks of each were explained to the (patient/caregiver).  Consent for procedure obtained. Time Out: Verified patient identification, verified procedure, site/side was marked, verified correct patient position, special equipment/implants available, medications/allergies/relevent history reviewed, required imaging and test results available.  Performed  In preparation for procedure, patient was given 100% FiO2. Sedation: Benzodiazepines, Fentanyl, diprivan and NMB.   Airway entered and the following bronchi were examined: RUL, RML, RLL, LUL, LLL and Bronchi.   Procedures performed: Brushings performed Bronchoscope removed.    Evaluation Hemodynamic Status: BP stable throughout; O2 sats: stable throughout Patient's Current Condition: stable Specimens:  None Complications: No apparent complications Patient did tolerate procedure well.   BABCOCK,PETE 10/14/2011  Attending MD: I participated in and supervised FOB by Kreg Shropshire. After trach placement I inspected all airways. There was some yellow, bloody mucous in LLL easily suctioned to full clearance. No abnormal secretions or endobronchial lesions seen. No evidence for trauma or bleeding post trach placement. Please refer also to Dr Gwendolyn Grant note regarding the details of the trach placement.   BYRUM,ROBERT S. 10/14/2011 11:01 AM

## 2011-10-14 NOTE — Progress Notes (Signed)
Subjective: Patient reports Intubated.    Objective: Vital signs in last 24 hours: Temp:  [99.7 F (37.6 C)-100.8 F (38.2 C)] 100.8 F (38.2 C) (12/04 0700) Pulse Rate:  [61-72] 67  (12/04 0803) Resp:  [13-21] 18  (12/04 0803) BP: (125-156)/(65-88) 125/68 mmHg (12/04 0803) SpO2:  [97 %-100 %] 98 % (12/04 0803) FiO2 (%):  [29.8 %-30.1 %] 30 % (12/04 0803) Weight:  [94.1 kg (207 lb 7.3 oz)] 207 lb 7.3 oz (94.1 kg) (12/04 0600)  Intake/Output from previous day: 12/03 0701 - 12/04 0700 In: 2110 [I.V.:390; NG/GT:1670; IV Piggyback:50] Out: 2910 [Urine:2910] Intake/Output this shift:    UE movement with oral care this am only. No response to voice or pain stimuli at present. No eye opening this am.  Lab Results:  [facility] 10/14/11 0350 10/13/11 0340  WBC 9.3 6.9  HGB 12.0* 12.7*  HCT 34.0* 36.6*  PLT 248 148*   BMET  Basename 10/14/11 0350 10/13/11 0340  NA 138 138  K 5.0 3.9  CL 103 103  CO2 27 27  GLUCOSE 136* 154*  BUN 21 19  CREATININE 0.73 0.71  CALCIUM 8.7 8.5    Studies/Results: Dg Chest Port 1 View  10/14/2011  *RADIOLOGY REPORT*  Clinical Data: Intubation  PORTABLE CHEST - 1 VIEW  Comparison: Portable exam 0655 hours compared to 10/13/2011  Findings: Tip of endotracheal tube 5.0 cm above carina. Nasogastric tube extends into stomach. Right arm PICC line, remains coiled in right jugular vein. Normal heart size, mediastinal contours, and pulmonary vascularity. Left lower lobe atelectasis versus consolidation. Remaining lungs clear. No pneumothorax.  IMPRESSION: Left lower lobe atelectasis versus consolidation. Right arm PICC line remains coiled in the right jugular vein; recommend withdrawal/placement.  Findings called to [facility] on 3100 on 10/14/2011 at 0835 hours.  Original Report Authenticated By: Lollie Marrow, M.D.   Dg Chest Port 1 View  10/13/2011  *RADIOLOGY REPORT*  Clinical Data: PICC line placement  PORTABLE CHEST - 1 VIEW  Comparison: Prior films same  day  Findings: Endotracheal tube in place with tip 4 cm above the carina.  Again noted malposition of the right PICC line with the tip looping in the right jugular vein.  Probable small hernia or tenting of the left hemidiaphragm.  No diagnostic pneumothorax.  IMPRESSION:  Again noted malposition of the right PICC line with the tip looping in the right jugular vein.  Probable small hernia or tenting of the left hemidiaphragm.  No diagnostic pneumothorax.  Original Report Authenticated By: Natasha Mead, M.D.   Dg Chest Port 1 View  10/13/2011  *RADIOLOGY REPORT*  Clinical Data: Respiratory difficulty  PORTABLE CHEST - 1 VIEW  Comparison: Yesterday  Findings: Normal heart size.  Endotracheal tube remains with its tip 4.4 cm from the carina. Stable NG tube beyond the gastroesophageal junction.  Stable bibasilar atelectasis.  No pneumothorax.  Right PICC placed.  It is looped in the right internal jugular vein with its tip at the confluence of the right internal jugular vein and right subclavian vein.  IMPRESSION: Stable endotracheal tube.  Stable bibasilar atelectasis.  Malpositioned right PICC.  It is looped in the right internal jugular vein.  Original Report Authenticated By: Donavan Burnet, M.D.   Ct Portable Head W/o Cm  10/13/2011  *RADIOLOGY REPORT*  Clinical Data: Followup the subdural hematoma drainage.  CT HEAD WITHOUT CONTRAST  Technique:  Contiguous axial images were obtained from the base of the skull through the vertex without contrast.  Comparison: 10/09/2011.  Findings: Post left frontal - parietal craniotomy for drainage of left-sided subdural hematoma.  The degree of pneumocephalus within the frontal region has decreased.  Subcutaneous hematoma and air remain.  The left sided subdural hematoma appears more dense than on the prior exam.  This is consistent with reaccumulation of acute hemorrhage and / or clotting blood.  Overall maximal thickness of 12.5 mm versus prior 11.3 mm.  Mass effect upon  the left lateral ventricle is midline shift to the right by 4.2 mm without significant change.  Underlying small amount of subarachnoid hemorrhage.  Anterior medial right frontal blood collection with surrounding vasogenic edema may represent result of small parenchymal hemorrhage.  Left basal ganglia subacute/acute infarct is better defined.  Remote left corona radiata small infarcts.  Streak artifact of the posterior fossa.  Ectatic intracranial vasculature.  Opacification paranasal sinuses.  IMPRESSION: The left sided subdural hematoma appears more dense than on the prior exam.  This is consistent with reaccumulation of acute hemorrhage and / or clotting blood.  Overall maximal thickness of 12.5 mm versus prior 11.3 mm.  Mass effect upon the left lateral ventricle is midline shift to the right by 4.2 mm without significant change.  Left basal ganglia and subacute/acute infarct is better defined.  Remote left corona radiata infarcts.  Small areas of residual subarachnoid hemorrhage.  Right frontal lobe hemorrhagic contusion is less well defined.  Pneumocephalus has decreased in the frontal lobe region.  Scalp fluid and air collection once again noted.  Initial interpretation by Dr. Cherly Hensen 10/13/2011 4:59 a.m.  Original Report Authenticated By: Fuller Canada, M.D.    Assessment/Plan: Dressing removed. Incision well-approximated with staples. No drainage, swelling, or erythema.   LOS: 7 days  Continue support.   Georgiann Cocker 10/14/2011, 8:38 AM   Prognosis for meaningful recovery remains guarded.

## 2011-10-14 NOTE — Op Note (Signed)
NAMEDEREK, LAUGHTER NO.:  [id number]  MEDICAL RECORD NO.:  [id number]  LOCATION:  3110                         FACILITY:  [facility]  PHYSICIAN:  Nelda Bucks, MD DATE OF BIRTH:  [birth year]  DATE OF PROCEDURE: DATE OF DISCHARGE:                              OPERATIVE REPORT   This is a percutaneous tracheostomy report.  Consent was obtained from his wife.  Fully aware of risks and benefits of the procedure including infection, bleeding, pneumothorax, and death. Blood loss for the procedure less than 1 mL.  PREOPERATIVE DIAGNOSES:  Massive subdural hematoma with shift, poor neurologic status with vent dependent respiratory failure.  POSTOPERATIVE DIAGNOSES:  Status post tracheostomy secondary to inability to protect airway and wean successfully secondary to massive subdural hematoma.  Bronchoscopist for the procedure is Dr. Jasmine Awe with 1st assistant Zenia Resides, ACNP.  The patient was placed in a supine position. Chlorhexidine preparation was used to sterilize the operative site.  The patient required propofol as well as vecuronium, therefore the respiratory rate was increased to 20 for the procedure.  The bronchoscopist placed the bronchoscope through the endotracheal tube and backed up to approximately 18 cm.  Lidocaine 7 mL plus epinephrine was injected over the 2nd and 3rd endotracheal space.  A 1.2 cm vertical incision was made over the site.  Dissection was made down in the tracheal planes.  There were no vascular anomalies.  An 18-gauge needle over white catheter sheath was placed into the airway and successfully noted by the bronchoscopist without any posterior wall injury.  The catheter remained and a wire was placed through it and the white catheter sheath was removed.  A 14-French punch dilator was placed over the wire successfully, then removed.  This was followed by a progressive rhino dilator to approximately 38-French over a glider.   The dilator was removed.  The glider and wire remained.  A size 8 tracheostomy over a 28- French dilator was placed over the wire and glider successfully into the airway.  Everything was removed except for the tracheostomy.  The bronchoscopist then placed the bronchoscope through the new tracheostomy and noted the carina approximately 5 cm below without any active bleeding whatsoever.  There seems to also be no evidence of any posterior wall injury.  The patient tolerated the procedure quite well with normal vital signs throughout.  No desaturation, normotension and a portable chest x-ray was ordered and pending at this time.     Nelda Bucks, MD     DJF/MEDQ  D:  10/14/2011  T:  10/14/2011  Job:  [id number]  cc:   Pramod P. Pearlean Brownie, MD Danae Orleans Venetia Maxon, M.D.

## 2011-10-14 NOTE — Telephone Encounter (Signed)
Patient's wife called to give you an update.  Patient had a Tracheotomy this morning and he did well.  They may put in a feeding tube in a couple of days.  Patient opened his eyes on Sunday,but hasn't opened them since Sunday.

## 2011-10-14 NOTE — Procedures (Signed)
Bedside Tracheostomy Insertion Procedure Note   Patient Details:   Name: ZAINE ELSASS DOB: 07/31/1941 MRN: [id number]  Procedure: Tracheostomy  Pre Procedure Assessment: ET Tube Size:8.0 ET Tube secured at lip (cm):24 Bite block in place: Yes Breath Sounds: Rhonch  Post Procedure Assessment: BP 125/68  Pulse 67  Temp(Src) 100.8 F (38.2 C) (Axillary)  Resp 18  Ht 5\' 10"  (1.778 m)  Wt 207 lb 7.3 oz (94.1 kg)  BMI 29.77 kg/m2  SpO2 98% O2 sats: stable throughout Complications: No apparent complications Patient did tolerate procedure well Tracheostomy Brand:Shiley Tracheostomy Style:Cuffed Tracheostomy Size: 8.0 Tracheostomy Secured NWG:[id number] Tracheostomy Placement Confirmation:Chest X ray ordered for placement  Vent setting changed during procedure to 100% and rate 20.  RT notified of change. RT will adjust setting per protocol  Ruthell Rummage Nanette 10/14/2011, 11:01 AM

## 2011-10-14 NOTE — Progress Notes (Signed)
Name: Gregory Humphrey MRN: [id number] DOB: [birth year]  DOS: 10/07/2011    LOS: 7  CRITICAL CARE CONSULT FOLLOWUP NOTE  History of Present Illness: 70 y/o man with h/o HTN brought to [facility] ED unresponsive after complaining of headache earlier.  Intubated for airway protection.  Head CT demonstrated large acute subdural, subarachnoid and parenchimal hemorrhage. OR crani 11/28.  Lines / Drains: 11/27  ETT>>> 11/27  Foley>>> A line rt rad 11/28 >>>12/2  Cultures / Sepsis markers: Sputum 11.28 >>> strep pneumo (PCN sensitive)  Antibiotics: (concern CAP  Vs asp) Ceftriaxone 11/28 >>> levoflox 11/28 >>12/2  Tests / Events: 11/27  Found unresponsive.  Head CT demonstrated large acute subdural, subarachnoid and parenchimal hemorrhage. 11/28- poor neuro exam 11/28- OR drain SDH 11/28- CT angio, no aneursym 11/28 Craniotomy for spontaneous SDH evacuation  Vital Signs:  BP 125/68  Pulse 67  Temp(Src) 100.8 F (38.2 C) (Axillary)  Resp 18  Ht 5\' 10"  (1.778 m)  Wt 94.1 kg (207 lb 7.3 oz)  BMI 29.77 kg/m2  SpO2 98% I/O last 3 completed shifts: In: 3367.5 [I.V.:605; NG/GT:2605; IV Piggyback:157.5] Out: 4005 [Urine:4005]    Physical Examination: Gen: on vent, no sedation, no acute distress HEENT: head dressing in place, pupils 2-3 mm, reactive bilat, ett in place PULM: rhonchi R > L CV: RRR, no mgr, no JVD AB: BS+, soft, nontender, no hsm Ext: warm, pitting edema, no clubbing, no cyanosis Derm: no rash or skin breakdown Neuro: somnolent on vent, spontaneous movements all over,   CBC    Component Value Date/Time   WBC 9.3 10/14/2011 0350   RBC 3.71* 10/14/2011 0350   HGB 12.0* 10/14/2011 0350   HCT 34.0* 10/14/2011 0350   PLT 248 10/14/2011 0350   MCV 91.6 10/14/2011 0350   MCH 32.3 10/14/2011 0350   MCHC 35.3 10/14/2011 0350   RDW 13.0 10/14/2011 0350   LYMPHSABS 0.9 10/10/2011 0415   MONOABS 0.9 10/10/2011 0415   EOSABS 0.0 10/10/2011 0415   BASOSABS 0.0 10/10/2011 0415     BMET    Component Value Date/Time   NA 138 10/14/2011 0350   K 5.0 10/14/2011 0350   CL 103 10/14/2011 0350   CO2 27 10/14/2011 0350   GLUCOSE 136* 10/14/2011 0350   BUN 21 10/14/2011 0350   CREATININE 0.73 10/14/2011 0350   CALCIUM 8.7 10/14/2011 0350   GFRNONAA >90 10/14/2011 0350   GFRAA >90 10/14/2011 0350   ABG    Component Value Date/Time   PHART 7.450 10/13/2011 0405   PCO2ART 37.1 10/13/2011 0405   PO2ART 107.0* 10/13/2011 0405   HCO3 25.3* 10/13/2011 0405   TCO2 26.4 10/13/2011 0405   ACIDBASEDEF 1.0 10/09/2011 0550   O2SAT 98.8 10/13/2011 0405      CXR reviewed: Dg Chest Port 1 View  10/14/2011  *RADIOLOGY REPORT*  Clinical Data: Intubation  PORTABLE CHEST - 1 VIEW  Comparison: Portable exam 0655 hours compared to 10/13/2011  Findings: Tip of endotracheal tube 5.0 cm above carina. Nasogastric tube extends into stomach. Right arm PICC line, remains coiled in right jugular vein. Normal heart size, mediastinal contours, and pulmonary vascularity. Left lower lobe atelectasis versus consolidation. Remaining lungs clear. No pneumothorax.  IMPRESSION: Left lower lobe atelectasis versus consolidation. Right arm PICC line remains coiled in the right jugular vein; recommend withdrawal/placement.  Findings called to [facility] on 3100 on 10/14/2011 at 0835 hours.  Original Report Authenticated By: Lollie Marrow, M.D.   Dg Chest [facility]  10/13/2011  *RADIOLOGY REPORT*  Clinical Data: PICC line placement  PORTABLE CHEST - 1 VIEW  Comparison: Prior films same day  Findings: Endotracheal tube in place with tip 4 cm above the carina.  Again noted malposition of the right PICC line with the tip looping in the right jugular vein.  Probable small hernia or tenting of the left hemidiaphragm.  No diagnostic pneumothorax.  IMPRESSION:  Again noted malposition of the right PICC line with the tip looping in the right jugular vein.  Probable small hernia or tenting of the left hemidiaphragm.  No diagnostic  pneumothorax.  Original Report Authenticated By: Natasha Mead, M.D.   Dg Chest Port 1 View  10/13/2011  *RADIOLOGY REPORT*  Clinical Data: Respiratory difficulty  PORTABLE CHEST - 1 VIEW  Comparison: Yesterday  Findings: Normal heart size.  Endotracheal tube remains with its tip 4.4 cm from the carina. Stable NG tube beyond the gastroesophageal junction.  Stable bibasilar atelectasis.  No pneumothorax.  Right PICC placed.  It is looped in the right internal jugular vein with its tip at the confluence of the right internal jugular vein and right subclavian vein.  IMPRESSION: Stable endotracheal tube.  Stable bibasilar atelectasis.  Malpositioned right PICC.  It is looped in the right internal jugular vein.  Original Report Authenticated By: Donavan Burnet, M.D.   Ct Portable Head W/o Cm  10/13/2011  *RADIOLOGY REPORT*  Clinical Data: Followup the subdural hematoma drainage.  CT HEAD WITHOUT CONTRAST  Technique:  Contiguous axial images were obtained from the base of the skull through the vertex without contrast.  Comparison: 10/09/2011.  Findings: Post left frontal - parietal craniotomy for drainage of left-sided subdural hematoma.  The degree of pneumocephalus within the frontal region has decreased.  Subcutaneous hematoma and air remain.  The left sided subdural hematoma appears more dense than on the prior exam.  This is consistent with reaccumulation of acute hemorrhage and / or clotting blood.  Overall maximal thickness of 12.5 mm versus prior 11.3 mm.  Mass effect upon the left lateral ventricle is midline shift to the right by 4.2 mm without significant change.  Underlying small amount of subarachnoid hemorrhage.  Anterior medial right frontal blood collection with surrounding vasogenic edema may represent result of small parenchymal hemorrhage.  Left basal ganglia subacute/acute infarct is better defined.  Remote left corona radiata small infarcts.  Streak artifact of the posterior fossa.  Ectatic  intracranial vasculature.  Opacification paranasal sinuses.  IMPRESSION: The left sided subdural hematoma appears more dense than on the prior exam.  This is consistent with reaccumulation of acute hemorrhage and / or clotting blood.  Overall maximal thickness of 12.5 mm versus prior 11.3 mm.  Mass effect upon the left lateral ventricle is midline shift to the right by 4.2 mm without significant change.  Left basal ganglia and subacute/acute infarct is better defined.  Remote left corona radiata infarcts.  Small areas of residual subarachnoid hemorrhage.  Right frontal lobe hemorrhagic contusion is less well defined.  Pneumocephalus has decreased in the frontal lobe region.  Scalp fluid and air collection once again noted.  Initial interpretation by Dr. Cherly Hensen 10/13/2011 4:59 a.m.  Original Report Authenticated By: Fuller Canada, M.D.     Assessment and Plan: 1) Large acute subdural hemorrhage with herniation. Now s/p crani, drainage.  Exam slowly improving. Dr Pearlean Brownie has discussed prognosis and goals with pt's wife, planning for extended support to see if he can have a significant neuro recovery  2)  Seizures concern -- keppra increased per neurology, following for seizure activity  3) Acute ventilator dependent respiratory failure due to neuro status --Cont full support, will require tracheostomy to allow extended support to even assess degree of neuro and resp recovery --plan for trach today  4) Possible PNA - pneumococcus in sputum --changed to ceftriaxone monotherapy, plan total duration abx 7 days  5) HTN emergency: BP OK off nicardipine  6) hyperkalemia --recheck K+ this pm  7) malnurished - Cont TFs  8) IV access; PICC malpositioned -picc will need to be repositioned  Best practices / Disposition: -->ICU status under PCCM, Neurosurgery and Neuro following -->DNR, aggressive care otherwise -->SCDs for DVT Px -->Protonix for GI Px -->TF started 11/29, will need PEG to facilitate  prolonged care - will consult GI -->family updated at bedside   Simcha Farrington S. 10/14/2011, 10:21 AM

## 2011-10-14 NOTE — Progress Notes (Signed)
Stroke Team Progress Note  SUBJECTIVE [name] is a 70 y.o. male whose mental status appears to be unchanged. Wife at the bedside. Discussed plan for the week, including trach and PEG. Pt currently getting EEG at bedside.   OBJECTIVE Most recent Vital Signs: Temp: 100.8 F (38.2 C) (12/04 0700) Temp src: Axillary (12/04 0700) BP: 125/68 mmHg (12/04 0803) Pulse Rate: 67  (12/04 0803) Respiratory Rate: 18 O2 Saturdation: 98%  CBG (last 3)   Basename 10/13/11 1952 10/13/11 0817 10/13/11 0340  GLUCAP 158* 151* 140*   Intake/Output from previous day: 12/03 0701 - 12/04 0700 In: 2110 [I.V.:390; NG/GT:1670; IV Piggyback:50] Out: 2910 [Urine:2910]  IV Fluid Intake      . sodium chloride 20 mL/hr at 10/14/11 0700  . feeding supplement (PROMOTE) 1,000 mL (10/12/11 1600)  . DISCONTD: niCARDipine Stopped (10/09/11 0258)  . DISCONTD: propofol Stopped (10/09/11 0915)   Diet  NPO tube feedings  Activity  Bedrest  DVT Prophylaxis  SCDs   Studies BASIC METABOLIC PANEL     Status: Abnormal   Collection Time   10/14/11  3:50 AM      Component Value Range   Sodium 138  135 - 145 (mEq/L)   Potassium 5.0  3.5 - 5.1 (mEq/L)   Chloride 103  96 - 112 (mEq/L)   CO2 27  19 - 32 (mEq/L)   Glucose, Bld 136 (*) 70 - 99 (mg/dL)   BUN 21  6 - 23 (mg/dL)   Creatinine, Ser 4.69  0.50 - 1.35 (mg/dL)   Calcium 8.7  8.4 - 62.9 (mg/dL)   GFR calc non Af Amer >90  >90 (mL/min)   GFR calc Af Amer >90  >90 (mL/min)  CBC     Status: Abnormal   Collection Time   10/14/11  3:50 AM      Component Value Range   WBC 9.3  4.0 - 10.5 (K/uL)   RBC 3.71 (*) 4.22 - 5.81 (MIL/uL)   Hemoglobin 12.0 (*) 13.0 - 17.0 (g/dL)   HCT 52.8 (*) 41.3 - 52.0 (%)   MCV 91.6  78.0 - 100.0 (fL)   MCH 32.3  26.0 - 34.0 (pg)   MCHC 35.3  30.0 - 36.0 (g/dL)   RDW 24.4  01.0 - 27.2 (%)   Platelets 248  150 - 400 (K/uL)    Physical Exam  Intubated. On ventilator. Not waking up. No agitation. Localizing on the  right. Left side hemiplegic. Heart rate regular. Breath sounds clear. No seizure activity noted. Left eye Swelling is less today and can easily be opened  Neurological exam drowsy but arouses today and opens eyes partially. Pupils 3 mm reactive. Corneal reflexes present.few side-to-side spontaneous eye movements. Doll's eye movements are sluggish.not following commands Purposeful withdrawal of the right upper and lower extremity to pain. Moves left upper extremity less. Does withdraw both lower extremity to pain minimally. Not able to maintain tone in both lower extremities against gravity Both plantars are equivocal  ASSESSMENT [name] is a 70 y.o. male with a large left SDH and 1.4 cm left to right shift with subfalcine and uncal herniation and cerebral edema post evacuation by Dr. Venetia Maxon. Hemorrhage secondary to ruptured smal left pareital cortical small AVM from coughing. He is beginning to wake up a little and localizing which is encouraging sign. Still unclear as to degree of recovery and eventual quality of life. Question possibility of seizures. Wife wants trach and PEG this week given  some improvement over the weekend and I agree.  Stroke risk factors: family history and hypertension  Hospital day # 7  TREATMENT/PLAN Trach and PEG this week. CCM to arrange. Follow up EEG.Discussed with wife and answered questions. [name], ANP-BC, GNP-BC [facility] Pager: [phone] 10/14/2011 9:19 AM  Dr. Delia Heady, Stroke Center Medical Director, has personally reviewed chart, pertinent data, examined the patient and developed the plan of care.

## 2011-10-14 NOTE — Procedures (Signed)
REFERRING PHYSICIAN:  Pramod P. Pearlean Brownie, MD  HISTORY:  A 70 year old male, status post left craniotomy due to subdural hematoma and subarachnoid hemorrhage noted to have seizure.  MEDICATIONS:  Norcuron, Protonix, Diprivan, Versed, Keppra, Sublimaze, Rocephin and Amidate.  CONDITIONS OF RECORDING:  This is a 16-channel EEG carried out with the patient in the unresponsive state.  DESCRIPTION:  The background activity is poorly organized and consists of a mixture of frequencies.  The majority of the rhythm is a mixture of delta and theta rhythms.  Occasionally, an alpha rhythm is noted.  The right frontoparietal region is of low-voltage and corresponds to the area of a craniotomy.  Hypoventilation was not performed.  Intermittent photic stimulation elicits a symmetrical driving response, but fails to elicit any abnormalities.  IMPRESSION:  This is an EEG that is characterized by general background slowing.  This finding is consistent with a diffuse disturbance that is etiologically nonspecific, but may include a metabolic encephalopathy versus postictal state among other possibilities.  No epileptiform activity was noted.  There was an absence of normal sleep transients.          ______________________________ Thana Farr, MD    [id number] D:  10/14/2011 19:41:03  T:  10/14/2011 20:41:03  Job #:  [id number]

## 2011-10-15 ENCOUNTER — Inpatient Hospital Stay (HOSPITAL_COMMUNITY): Payer: Medicare Other

## 2011-10-15 ENCOUNTER — Encounter (HOSPITAL_COMMUNITY): Payer: Self-pay | Admitting: Physician Assistant

## 2011-10-15 DIAGNOSIS — I629 Nontraumatic intracranial hemorrhage, unspecified: Secondary | ICD-10-CM

## 2011-10-15 DIAGNOSIS — R1310 Dysphagia, unspecified: Secondary | ICD-10-CM

## 2011-10-15 LAB — CBC
MCH: 31.6 pg (ref 26.0–34.0)
MCHC: 34.6 g/dL (ref 30.0–36.0)
MCV: 91.2 fL (ref 78.0–100.0)
Platelets: 225 10*3/uL (ref 150–400)
RDW: 12.7 % (ref 11.5–15.5)
WBC: 9.2 10*3/uL (ref 4.0–10.5)

## 2011-10-15 LAB — BASIC METABOLIC PANEL
BUN: 25 mg/dL — ABNORMAL HIGH (ref 6–23)
CO2: 23 mEq/L (ref 19–32)
Calcium: 8.9 mg/dL (ref 8.4–10.5)
Creatinine, Ser: 0.91 mg/dL (ref 0.50–1.35)

## 2011-10-15 LAB — GLUCOSE, CAPILLARY: Glucose-Capillary: 138 mg/dL — ABNORMAL HIGH (ref 70–99)

## 2011-10-15 MED ORDER — IOHEXOL 300 MG/ML  SOLN
5.0000 mL | Freq: Once | INTRAMUSCULAR | Status: AC | PRN
  Administered 2011-10-15: 5 mL

## 2011-10-15 MED ORDER — POTASSIUM CHLORIDE 20 MEQ/15ML (10%) PO LIQD
ORAL | Status: AC
  Filled 2011-10-15: qty 30

## 2011-10-15 NOTE — Progress Notes (Signed)
Clinical Social Worker phoned wife to offer support, CSW left a voice mail message with wife.  CSW to continue to follow and assist as needed.   Angelia Mould, MSW, [place] [phone]

## 2011-10-15 NOTE — Progress Notes (Signed)
Nutrition Follow-up  Diet Order:  NPO, Promote at 85 ml/hr providing 2040 kcal, 127 g protein, 1712 ml free water. This was held due to trach placement yesterday. Panda place under fluoroscopy.   PEG tube placement scheduled for tomorrow.   Meds: Scheduled Meds:   . antiseptic oral rinse  1 application Mouth Rinse QID  . chlorhexidine  15 mL Mouth/Throat BID  . Flexiflo Toptainer Feed Set      . Flexiflo Toptainer Feed Set      . insulin aspart  0-9 Units Subcutaneous Q4H  . levetiracetam  750 mg Intravenous Q12H  . midazolam      . pantoprazole (PROTONIX) IV  40 mg Intravenous Q24H  . potassium chloride      . senna-docusate  1 tablet Oral BID  . sodium chloride  10 mL Intracatheter Q12H  . DISCONTD: cefTRIAXone (ROCEPHIN)  IV  1 g Intravenous Q24H  . DISCONTD: etomidate  40 mg Intravenous Once  . DISCONTD: fentaNYL  200 mcg Intravenous Once  . DISCONTD: midazolam  5 mg Intravenous Once  . DISCONTD: propofol  5-70 mcg/kg/min Intravenous Once  . DISCONTD: vecuronium  10 mg Intravenous Once   Continuous Infusions:   . sodium chloride 20 mL/hr at 10/15/11 1100  . feeding supplement (PROMOTE) 1,000 mL (10/15/11 1149)   PRN Meds:.acetaminophen, acetaminophen, fentaNYL, iohexol, ondansetron (ZOFRAN) IV, sodium chloride  Labs:  CMP     Component Value Date/Time   NA 138 10/15/2011 0348   K 3.8 10/15/2011 0348   CL 104 10/15/2011 0348   CO2 23 10/15/2011 0348   GLUCOSE 127* 10/15/2011 0348   BUN 25* 10/15/2011 0348   CREATININE 0.91 10/15/2011 0348   CALCIUM 8.9 10/15/2011 0348   PROT 6.0 10/10/2011 0415   ALBUMIN 2.5* 10/10/2011 0415   AST 27 10/10/2011 0415   ALT 23 10/10/2011 0415   ALKPHOS 49 10/10/2011 0415   BILITOT 1.0 10/10/2011 0415   GFRNONAA 84* 10/15/2011 0348   GFRAA >90 10/15/2011 0348     Intake/Output Summary (Last 24 hours) at 10/15/11 1248 Last data filed at 10/15/11 1100  Gross per 24 hour  Intake    520 ml  Output   2100 ml  Net  -1580 ml    Weight  Status:  89.3 kg, 10 pound weight gain over 1 week, ? Due to fluid  Nutrition Dx:  Inadequate oral intake, ongoing  Goal:  EN to meet >90% of estimated nutrition needs, TF at goal meets 100% of needs.  Intervention:  Once PEG placed, restart Promote at 25 ml/hr, increase by 10 ml/hr every 4 hours to goal rate of 85 ml/hr.   Monitor:  EN advance and tolerance, weight, labs.    Fabio Pierce Pager #:  [phone]

## 2011-10-15 NOTE — Consult Note (Signed)
[facility] Consult: 10:32 AM 10/15/2011    Referring Provider: Dr. Delton Coombes  Primary Care Physician:  Laurita Quint, MD, MD Primary Gastroenterologist: None found  Reason for Consultation:  Placement of PEG  HPI: Gregory Humphrey is a 70 y.o. male.  He was previously healthy.  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 S/P 10/08/11 craniotomy and evacuation of hematoma.  Remains tube feed and vent dependent.  Trach placed 12/4.   Tolerating panda tube feeds without incidence.  Pt is DNR but family is hopeful for some meaningful recovery.     Past Medical History  Diagnosis Date  . Hypertension   . Gilbert's syndrome   . Kidney stones   . Olecranon bursitis     Past Surgical History  Procedure Date  . Refractive surgery 1995  . Craniotomy 10/08/2011    Procedure: CRANIOTOMY HEMATOMA EVACUATION SUBDURAL;  Surgeon: Dorian Heckle, MD;  Location: [facility];  Service: Neurosurgery;  Laterality: Left;  Left Craniotomy for subdural    Prior to Admission medications   Medication Sig Start Date End Date Taking? Authorizing Provider  Alpha-Lipoic Acid 200 MG TABS Take 1 tablet by mouth daily.    Yes Historical Provider, MD  Ascorbic Acid (VITAMIN C) 1000 MG tablet Take 1,000 mg by mouth 3 (three) times daily.    Yes Historical Provider, MD  Astaxanthin 4 MG CAPS Take 4 mg by mouth daily.    Yes Historical Provider, MD  Calcium Carb-Cholecalciferol (CALCIUM 1000 + D PO) Take 1 tablet by mouth daily.    Yes Historical Provider, MD  Coenzyme Q10 (COQ-10) 200 MG CAPS Take 1 capsule by mouth daily.    Yes Historical Provider, MD  Cyanocobalamin (B-12) 2500 MCG TABS Take 2,500 mcg by mouth daily.    Yes Historical Provider, MD  DHEA 50 MG CAPS Take 50 mg by mouth daily.    Yes Historical Provider, MD  Flaxseed, Linseed, (FLAX SEEDS PO) Take 1 capsule by mouth 2 (two) times daily.    Yes Historical  Provider, MD  folic acid (FOLVITE) 800 MCG tablet Take 6,400 mcg by mouth daily.    Yes Historical Provider, MD  Garlic Oil 1000 MG CAPS Take 1 capsule by mouth daily.    Yes Historical Provider, MD  Ginkgo Biloba 120 MG CAPS Take 1 capsule by mouth daily.    Yes Historical Provider, MD  Hyaluronic Acid-Vitamin C (HYALURONIC ACID PO) Take 1 tablet by mouth daily.    Yes Historical Provider, MD  Magnesium 400 MG CAPS Take 1 tablet by mouth daily.    Yes Historical Provider, MD  Melatonin 5 MG CAPS Take 1 capsule by mouth at bedtime.    Yes Historical Provider, MD  Omega-3 Fatty Acids (FISH OIL) 1000 MG CAPS Take 1 capsule by mouth 2 (two) times daily.    Yes Historical Provider, MD  Pregnenolone Micronized POWD Take 1 packet by mouth daily.    Yes Historical Provider, MD  pyridOXINE (VITAMIN B-6) 100 MG tablet Take 100 mg by mouth daily.     Yes Historical Provider, MD  Specialty Vitamins Products (BILBERRY PLUS PO) Take 1 tablet by mouth 2 (two) times daily.    Yes Historical Provider, MD  triamterene-hydrochlorothiazide (MAXZIDE-25) 37.5-25 MG per tablet Take 0.5 tablets by mouth daily.    Yes Historical Provider, MD  Vitamin Mixture (VITAMIN E-SELENIUM) 400-50 UNIT-MCG CAPS Take 1 capsule by mouth daily.    Yes Historical Provider, MD  Scheduled Meds:    . antiseptic oral rinse  1 application Mouth Rinse QID  . chlorhexidine  15 mL Mouth/Throat BID  . Flexiflo Toptainer Feed Set      . Flexiflo Toptainer Feed Set      . insulin aspart  0-9 Units Subcutaneous Q4H  . levetiracetam  750 mg Intravenous Q12H  . midazolam      . pantoprazole (PROTONIX) IV  40 mg Intravenous Q24H  . senna-docusate  1 tablet Oral BID  . sodium chloride  10 mL Intracatheter Q12H  . DISCONTD: cefTRIAXone (ROCEPHIN)  IV  1 g Intravenous Q24H  . DISCONTD: etomidate  40 mg Intravenous Once  . DISCONTD: fentaNYL  200 mcg Intravenous Once  . DISCONTD: midazolam  5 mg Intravenous Once  . DISCONTD:  propofol  5-70 mcg/kg/min Intravenous Once  . DISCONTD: vecuronium  10 mg Intravenous Once   Infusions:    . sodium chloride 20 mL/hr at 10/15/11 1000  . feeding supplement (PROMOTE) 1,000 mL (10/12/11 1600)   PRN Meds: acetaminophen, acetaminophen, fentaNYL, iohexol, ondansetron (ZOFRAN) IV, sodium chloride   Allergies as of 10/07/2011  . (No Known Allergies)    Family History  Problem Relation Age of Onset  . Stroke Mother   . Hypertension Mother   . Diabetes Mother   . Cancer Father     bone  . Alcohol abuse Brother     past  . Stroke Maternal Grandfather     History   Social History  . Marital Status: Married    Spouse Name: N/A    Number of Children: N/A  . Years of Education: N/A   Occupational History  . 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 Smoking status: Never Smoker   . Smokeless tobacco: Never Used  . Alcohol Use: No  . Drug Use: No  . Sexually Active: No   Other Topics Concern  . Not on file   Social History Narrative   Married, lives with wife, adopted son    REVIEW OF SYSTEMS:  Unable to obtain.  PHYSICAL EXAM: Vital signs in last 24 hours: Temp:  [99.1 F (37.3 C)-101.7 F (38.7 C)] 101.1 F (38.4 C) (12/05 1000) Pulse Rate:  [61-77] 69  (12/05 1000) Resp:  [19-20] 20  (12/05 1000) BP: (118-152)/(67-78) 152/78 mmHg (12/05 1000) SpO2:  [97 %-100 %] 98 % (12/05 1000) FiO2 (%):  [29.8 %-100 %] 99 % (12/05 1000) Weight:  [89.3 kg (196 lb 13.9 oz)] 196 lb 13.9 oz (89.3 kg) (12/05 0400)   General: Unresponsive WM with trach, on vent. Head:  Stapled incision is clean, dry , intact on left frontal scalp  Eyes:  No conjunctival pallor Ears:  Unable to assess hearing  Nose:  No discharge, panda tube in place. Mouth:  Mucosa pink and moist Neck:  Dry blood at trach site Lungs:  No Ronchi Heart: RRR, no MRG.  S1, S2 audible Abdomen:  Soft, NT, non obese, ND, no masses or scars. Active  BS.   Rectal: deferred   Musc/Skeltl: no joint swelling or deformities Extremities:  No pedal edema, feet cool but cap refill to toes is brisk.  Neurologic:  unresponsive Skin:  No rash, no sores Tattoos:  None seen   Intake/Output from previous day: 12/04 0701 - 12/05 0700 In: 540 [I.V.:480; IV Piggyback:60] Out: 2085 [Urine:2085] Intake/Output this shift: Total I/O In: 60 [I.V.:60] Out: 115 [Urine:115]  LAB RESULTS:  Basename 10/15/11 0348 10/14/11 0350 10/13/11 0340  WBC 9.2 9.3 6.9  HGB 12.5* 12.0* 12.7*  HCT 36.1* 34.0* 36.6*  PLT 225 248 148*   BMET Lab Results  Component Value Date   NA 138 10/15/2011   NA 138 10/14/2011   NA 138 10/13/2011   K 3.8 10/15/2011   K 5.0 10/14/2011   K 3.9 10/13/2011   CL 104 10/15/2011   CL 103 10/14/2011   CL 103 10/13/2011   CO2 23 10/15/2011   CO2 27 10/14/2011   CO2 27 10/13/2011   GLUCOSE 127* 10/15/2011   GLUCOSE 136* 10/14/2011   GLUCOSE 154* 10/13/2011   BUN 25* 10/15/2011   BUN 21 10/14/2011   BUN 19 10/13/2011   CREATININE 0.91 10/15/2011   CREATININE 0.73 10/14/2011   CREATININE 0.71 10/13/2011   CALCIUM 8.9 10/15/2011   CALCIUM 8.7 10/14/2011   CALCIUM 8.5 10/13/2011   LFT Lab Results  Component Value Date   PROT 6.0 10/10/2011   PROT 6.0 10/09/2011   PROT 7.8 10/07/2011   ALBUMIN 2.5* 10/10/2011   ALBUMIN 2.7* 10/09/2011   ALBUMIN 4.2 10/07/2011   AST 27 10/10/2011   AST 34 10/09/2011   AST 34 10/07/2011   ALT 23 10/10/2011   ALT 30 10/09/2011   ALT 42 10/07/2011   ALKPHOS 49 10/10/2011   ALKPHOS 46 10/09/2011   ALKPHOS 59 10/07/2011   BILITOT 1.0 10/10/2011   BILITOT 1.2 10/09/2011   BILITOT 0.8 10/07/2011   BILIDIR 0.2 09/08/2011   BILIDIR 0.2 09/02/2010   BILIDIR 0.2 06/13/2009   PT/INR Lab Results  Component Value Date   INR 1.02 10/07/2011      RADIOLOGY STUDIES: Dg Abd 1 View  10/15/2011  *RADIOLOGY REPORT*  Clinical Data: Feeding tube placement under fluoroscopy  ABDOMEN - 1 VIEW  Comparison: None   Findings: Single digital C-arm fluoroscopic image obtained. Image demonstrates tip of feeding tube in the left upper quadrant. Contrast has been injected and opacifies the lumen of a jejunal loop compatible with placement of feeding tube beyond ligament of Treitz. Bowel wall thickening of the distal portion of the opacified loop.  IMPRESSION: Tip of feeding tube is beyond ligament Treitz in the proximal jejunum. Bowel wall thickening of the observed jejunal loop, uncertain etiology.  Original Report Authenticated By: Lollie Marrow, M.D.   Dg Chest Port 1 View  10/15/2011  *RADIOLOGY REPORT*  Clinical Data: Respiratory difficulty  PORTABLE CHEST - 1 VIEW  Comparison: 10/14/2011  Findings: Left basilar atelectasis verses airspace disease stable. Lower lung volumes.  Stable endotracheal tube. Right PICC is not coiled in the right internal jugular vein.  No pneumothorax.  IMPRESSION: Right PICC coiled in the right internal jugular vein.  Stable left basilar atelectasis verses airspace disease.  Original Report Authenticated By: Donavan Burnet, M.D.    ENDOSCOPIC STUDIES: None found.  IMPRESSION:  1.  Neurogenic dysphagia, unable to swallow following ICH.  PEG is best option for long term nutrition and meds administration. 2. VDRF.  Trach placed 2 days ago. 3. Fevers. WBC count normal.  11/28 sputum grew strep pneumo.  Completed Rocephin and Levaquin. D/W Dr. Delton Coombes who feels fevers are sequelae of the hemorrhage. 4. DM2, new diagnoses.   PLAN: 1.  PEG placement, timing per Dr. Rhea Belton.   I tentatively set up procedure for 10 AM tomorrow.  Preop abx ordered.  Will aim to discuss procedure with pt's wife today.    LOS: 8 days  Jennye Moccasin  10/15/2011, 10:32 AM Pager: [phone]

## 2011-10-15 NOTE — Consult Note (Signed)
Patient seen, examined and I agree with the above documentation, including the assessment and plan. Pt with devastating intracranial hemorrhage.  S/p trach, now needing PEG Discussed risks/benefits with wife at bedside today.  She is willing to proceed. Will plan for tomorrow.  On no anticoagulation/blood thinners. Needs TFs held at [facility].

## 2011-10-15 NOTE — Progress Notes (Signed)
Name: Gregory Humphrey MRN: [id number] DOB: 1941/05/01  DOS: 10/07/2011    LOS: 8  CRITICAL CARE CONSULT FOLLOWUP NOTE  History of Present Illness: 70 y/o man with h/o HTN brought to [facility] ED unresponsive after complaining of headache earlier.  Intubated for airway protection.  Head CT demonstrated large acute subdural, subarachnoid and parenchimal hemorrhage. OR crani 11/28.  Lines / Drains: 11/27  ETT>>> 12/5 12/2 RUE PICC >>  12/5 #8 trach (DF) >> 11/27  Foley>>> A line rt rad 11/28 >>>12/2  Cultures / Sepsis markers: Sputum 11.28 >>> strep pneumo (PCN sensitive)  Antibiotics: (concern CAP  Vs asp) Ceftriaxone 11/28 >>> 12/5 levoflox 11/28 >>12/2  Tests / Events: 11/27  Found unresponsive.  Head CT demonstrated large acute subdural, subarachnoid and parenchimal hemorrhage. 11/28- poor neuro exam 11/28- OR drain SDH 11/28- CT angio, no aneursym 11/28 Craniotomy for spontaneous SDH evacuation  Vital Signs:  BP 135/72  Pulse 71  Temp(Src) 101.1 F (38.4 C) (Axillary)  Resp 20  Ht 5\' 10"  (1.778 m)  Wt 89.3 kg (196 lb 13.9 oz)  BMI 28.25 kg/m2  SpO2 99% I/O last 3 completed shifts: In: 1430 [I.V.:720; NG/GT:650; IV Piggyback:60] Out: 3345 [Urine:3345] Total I/O In: 40 [I.V.:40] Out: 115 [Urine:115]  Physical Examination: Gen: on vent, no sedation, no acute distress HEENT: head dressing in place, pupils 2-3 mm, reactive bilat, ett in place PULM: rhonchi R > L CV: RRR, no mgr, no JVD AB: BS+, soft, nontender, no hsm Ext: warm, pitting edema, no clubbing, no cyanosis Derm: no rash or skin breakdown Neuro: somnolent on vent, spontaneous movements all over,   CBC    Component Value Date/Time   WBC 9.2 10/15/2011 0348   RBC 3.96* 10/15/2011 0348   HGB 12.5* 10/15/2011 0348   HCT 36.1* 10/15/2011 0348   PLT 225 10/15/2011 0348   MCV 91.2 10/15/2011 0348   MCH 31.6 10/15/2011 0348   MCHC 34.6 10/15/2011 0348   RDW 12.7 10/15/2011 0348   LYMPHSABS 0.9 10/10/2011 0415   MONOABS 0.9 10/10/2011 0415   EOSABS 0.0 10/10/2011 0415   BASOSABS 0.0 10/10/2011 0415    BMET    Component Value Date/Time   NA 138 10/15/2011 0348   K 3.8 10/15/2011 0348   CL 104 10/15/2011 0348   CO2 23 10/15/2011 0348   GLUCOSE 127* 10/15/2011 0348   BUN 25* 10/15/2011 0348   CREATININE 0.91 10/15/2011 0348   CALCIUM 8.9 10/15/2011 0348   GFRNONAA 84* 10/15/2011 0348   GFRAA >90 10/15/2011 0348   ABG    Component Value Date/Time   PHART 7.450 10/13/2011 0405   PCO2ART 37.1 10/13/2011 0405   PO2ART 107.0* 10/13/2011 0405   HCO3 25.3* 10/13/2011 0405   TCO2 26.4 10/13/2011 0405   ACIDBASEDEF 1.0 10/09/2011 0550   O2SAT 98.8 10/13/2011 0405      CXR reviewed: Dg Abd 1 View  10/15/2011  *RADIOLOGY REPORT*  Clinical Data: Feeding tube placement under fluoroscopy  ABDOMEN - 1 VIEW  Comparison: None  Findings: Single digital C-arm fluoroscopic image obtained. Image demonstrates tip of feeding tube in the left upper quadrant. Contrast has been injected and opacifies the lumen of a jejunal loop compatible with placement of feeding tube beyond ligament of Treitz. Bowel wall thickening of the distal portion of the opacified loop.  IMPRESSION: Tip of feeding tube is beyond ligament Treitz in the proximal jejunum. Bowel wall thickening of the observed jejunal loop, uncertain etiology.  Original Report Authenticated By: Loraine Leriche  A. Tyron Russell, M.D.   Dg Chest Port 1 View  10/15/2011  *RADIOLOGY REPORT*  Clinical Data: Respiratory difficulty  PORTABLE CHEST - 1 VIEW  Comparison: 10/14/2011  Findings: Left basilar atelectasis verses airspace disease stable. Lower lung volumes.  Stable endotracheal tube. Right PICC is not coiled in the right internal jugular vein.  No pneumothorax.  IMPRESSION: Right PICC coiled in the right internal jugular vein.  Stable left basilar atelectasis verses airspace disease.  Original Report Authenticated By: Donavan Burnet, M.D.   Chest Portable 1 View To Assess Tube Placement And  Rule-out Pneumothorax  10/14/2011  *RADIOLOGY REPORT*  Clinical Data: Tracheostomy tube placement, rule out pneumothorax  PORTABLE CHEST - 1 VIEW  Comparison: Prior films same day  Findings: Cardiomediastinal silhouette is stable.  There is a tracheostomy tube with tip 5.3 cm above the carina.  Right arm PICC line is noted with tip coiled in the SVC.  There is no diagnostic pneumothorax.  No acute infiltrate or edema.  IMPRESSION: Tracheostomy tube in place.  No diagnostic pneumothorax.  Right arm PICC line with the tip coiled in the SVC.  Original Report Authenticated By: Natasha Mead, M.D.   Dg Chest Port 1 View  10/14/2011  *RADIOLOGY REPORT*  Clinical Data: Intubation  PORTABLE CHEST - 1 VIEW  Comparison: Portable exam 0655 hours compared to 10/13/2011  Findings: Tip of endotracheal tube 5.0 cm above carina. Nasogastric tube extends into stomach. Right arm PICC line, remains coiled in right jugular vein. Normal heart size, mediastinal contours, and pulmonary vascularity. Left lower lobe atelectasis versus consolidation. Remaining lungs clear. No pneumothorax.  IMPRESSION: Left lower lobe atelectasis versus consolidation. Right arm PICC line remains coiled in the right jugular vein; recommend withdrawal/placement.  Findings called to [facility] on 3100 on 10/14/2011 at 0835 hours.  Original Report Authenticated By: Lollie Marrow, M.D.   Dg Chest Port 1 View  10/13/2011  *RADIOLOGY REPORT*  Clinical Data: PICC line placement  PORTABLE CHEST - 1 VIEW  Comparison: Prior films same day  Findings: Endotracheal tube in place with tip 4 cm above the carina.  Again noted malposition of the right PICC line with the tip looping in the right jugular vein.  Probable small hernia or tenting of the left hemidiaphragm.  No diagnostic pneumothorax.  IMPRESSION:  Again noted malposition of the right PICC line with the tip looping in the right jugular vein.  Probable small hernia or tenting of the left hemidiaphragm.  No  diagnostic pneumothorax.  Original Report Authenticated By: Natasha Mead, M.D.    Assessment and Plan: 1) Large acute subdural hemorrhage with herniation. Now s/p crani, drainage.  Exam slowly improving. Dr Pearlean Brownie has discussed prognosis and goals with pt's wife, planning for extended support to see if he can have a significant neuro recovery. Will require LTAC level care vs SNF (if able to wean off MV).  2) Seizures risk -- keppra increased per neurology, following for seizure activity  3) Acute ventilator dependent respiratory failure due to neuro status --Cont full support, trach placed 12/5. Anticipate he will need at least airway protection and possibly MV support for weeks to months to fully assess plateau in neuro recovery. Will work towards PSV and hopefully soon if he can tolerate.  4) Possible PNA - pneumococcus in sputum --changed to ceftriaxone monotherapy, day 8 of 8 12/5, d/c and follow  5) HTN emergency: BP OK off nicardipine  6) hyperkalemia, resolved Lab Results  Component Value Date   NA  138 10/15/2011   K 3.8 10/15/2011   CL 104 10/15/2011   CO2 23 10/15/2011   7) Nutrition  - panda placed under fluoro 12/4, restart TFs - will consult [facility] for PEG placement  8) IV access; asked IV team to reassess 12/3, PICC remains coiled in RIJ - IR to properly position 12/5  Best practices / Disposition: -->ICU status under PCCM, Neurosurgery and Neuro following -->DNR, aggressive care otherwise -->SCDs for DVT Px -->Protonix for GI Px -->TF started 11/29, restart 12/4 after panda, will need PEG to facilitate prolonged care - will consult GI -->family updated at bedside   Jennifr Gaeta S. 10/15/2011, 9:42 AM

## 2011-10-15 NOTE — Progress Notes (Signed)
Stroke Team Progress Note  SUBJECTIVE [name] is a 70 y.o. male whose wife feels their symptoms are unchanged. Pt received trach yest, for PEG later this week, though wife not sure when. OG d/c'd yest with trach. Panda now in place, await verification of placement for resumption of tube feedings. She is hopeful for ongoing recovery.  OBJECTIVE Most recent Vital Signs: Temp: 101.3 F (38.5 C) (12/05 0700) BP: 140/74 mmHg (12/05 0700) Pulse Rate: 66  (12/05 0700) Respiratory Rate: 20 O2 Saturdation: 99%  CBG (last 3)   Basename 10/15/11 0344 10/14/11 2337 10/14/11 1942  GLUCAP 112* 113* 115*   Intake/Output from previous day: 12/04 0701 - 12/05 0700 In: 540 [I.V.:480; IV Piggyback:60] Out: 2085 [Urine:2085]  IV Fluid Intake     . sodium chloride 20 mL/hr at 10/15/11 0700  . feeding supplement (PROMOTE) 1,000 mL (10/12/11 1600)   Diet  NPO tube feedings currently off  Activity  Bedrest  DVT Prophylaxis  SCDs   Studies BASIC METABOLIC PANEL     Status: Abnormal   Collection Time   10/15/11  3:48 AM      Component Value Range   Sodium 138  135 - 145 (mEq/L)   Potassium 3.8  3.5 - 5.1 (mEq/L)   Chloride 104  96 - 112 (mEq/L)   CO2 23  19 - 32 (mEq/L)   Glucose, Bld 127 (*) 70 - 99 (mg/dL)   BUN 25 (*) 6 - 23 (mg/dL)   Creatinine, Ser 1.61  0.50 - 1.35 (mg/dL)   Calcium 8.9  8.4 - 09.6 (mg/dL)   GFR calc non Af Amer 84 (*) >90 (mL/min)   GFR calc Af Amer >90  >90 (mL/min)  CBC     Status: Abnormal   Collection Time   10/15/11  3:48 AM      Component Value Range   WBC 9.2  4.0 - 10.5 (K/uL)   RBC 3.96 (*) 4.22 - 5.81 (MIL/uL)   Hemoglobin 12.5 (*) 13.0 - 17.0 (g/dL)   HCT 04.5 (*) 40.9 - 52.0 (%)   MCV 91.2  78.0 - 100.0 (fL)   MCH 31.6  26.0 - 34.0 (pg)   MCHC 34.6  30.0 - 36.0 (g/dL)   RDW 81.1  91.4 - 78.2 (%)   Platelets 225  150 - 400 (K/uL)     Dg Chest Port 1 View 10/15/2011  Right PICC coiled in the right internal jugular vein.  Stable left  basilar atelectasis verses airspace disease.    10/14/2011 Left lower lobe atelectasis versus consolidation. Right arm PICC line remains coiled in the right jugular vein; recommend withdrawal/placement .  Chest Portable 1 View To Assess Tube Placement And Rule-out Pneumothorax 10/14/2011  Tracheostomy tube in place.  No diagnostic pneumothorax.  Right arm PICC line with the tip coiled in the SVC.   Findings called to [facility] on 3100 on 10/14/2011 at 0835 hours.  Original Report Authenticated By: Lollie Marrow, M.D.   Physical Exam Intubated. On ventilator. Not waking up. No agitation. Localizing on the right. Left side moves less than right. Heart rate regular. Breath sounds clear. No seizure activity noted.  Neurological exam drowsy but arouses today and opens eyes partially to sternal rub. Pupils 3 mm reactive. Corneal reflexes present.few side-to-side spontaneous eye movements. Doll's eye movements are sluggish.not following commands Purposeful withdrawal of the right upper and lower extremity to pain. Moves left upper extremity less. Does withdraw both lower extremity to pain minimally.  Not able to maintain tone in both lower extremities against gravity Both plantars are equivocal  ASSESSMENT [name] is a 70 y.o. male with a large left SDH and 1.4 cm left to right shift with subfalcine and uncal herniation and cerebral edema post evacuation by Dr. Venetia Maxon. Hemorrhage secondary to ruptured smal left pareital cortical small AVM from coughing.  Still unclear as to degree of recovery and eventual quality of life. Question possibility of seizures, EEG done, result pending. Trach placed yest. For PEG later this week. New panda today.  Stroke risk factors: family history and hypertension  Hospital day # 8  TREATMENT/PLAN Follow up EEG. Peg later this week. Discussed plan with wife.  [name], ANP-BC, GNP-BC [facility] Pager: [phone] 10/15/2011 8:02 AM  Dr.  Delia Heady, Stroke Center Medical Director, has personally reviewed chart, pertinent data, examined the patient and developed the plan of care.

## 2011-10-15 NOTE — Progress Notes (Signed)
Patient ID: Gregory Humphrey, male   DOB: 07-20-1941, 70 y.o.   MRN: [id number] Subjective: Patient reports unresponsive on vent.  Recently trached  Objective: Vital signs in last 24 hours: Temp:  [99.1 F (37.3 C)-101.7 F (38.7 C)] 101.3 F (38.5 C) (12/05 0700) Pulse Rate:  [61-77] 66  (12/05 0700) Resp:  [13-20] 20  (12/05 0700) BP: (118-143)/(67-77) 140/74 mmHg (12/05 0700) SpO2:  [97 %-100 %] 99 % (12/05 0700) FiO2 (%):  [30 %-100 %] 30 % (12/05 0700) Weight:  [89.3 kg (196 lb 13.9 oz)] 196 lb 13.9 oz (89.3 kg) (12/05 0400)  Intake/Output from previous day: 12/04 0701 - 12/05 0700 In: 540 [I.V.:480; IV Piggyback:60] Out: 2085 [Urine:2085] Intake/Output this shift:    Physical Exam: Pupils reactive.  No eye opening or F/C.  Weak withdrawal to pain.  Craniotomy incision healing well.  Lab Results:  [facility] 10/15/11 0348 10/14/11 0350  WBC 9.2 9.3  HGB 12.5* 12.0*  HCT 36.1* 34.0*  PLT 225 248   BMET  Basename 10/15/11 0348 10/14/11 0350  NA 138 138  K 3.8 5.0  CL 104 103  CO2 23 27  GLUCOSE 127* 136*  BUN 25* 21  CREATININE 0.91 0.73  CALCIUM 8.9 8.7    Studies/Results: Dg Chest Port 1 View  10/15/2011  *RADIOLOGY REPORT*  Clinical Data: Respiratory difficulty  PORTABLE CHEST - 1 VIEW  Comparison: 10/14/2011  Findings: Left basilar atelectasis verses airspace disease stable. Lower lung volumes.  Stable endotracheal tube. Right PICC is not coiled in the right internal jugular vein.  No pneumothorax.  IMPRESSION: Right PICC coiled in the right internal jugular vein.  Stable left basilar atelectasis verses airspace disease.  Original Report Authenticated By: Donavan Burnet, M.D.   Chest Portable 1 View To Assess Tube Placement And Rule-out Pneumothorax  10/14/2011  *RADIOLOGY REPORT*  Clinical Data: Tracheostomy tube placement, rule out pneumothorax  PORTABLE CHEST - 1 VIEW  Comparison: Prior films same day  Findings: Cardiomediastinal silhouette is stable.   There is a tracheostomy tube with tip 5.3 cm above the carina.  Right arm PICC line is noted with tip coiled in the SVC.  There is no diagnostic pneumothorax.  No acute infiltrate or edema.  IMPRESSION: Tracheostomy tube in place.  No diagnostic pneumothorax.  Right arm PICC line with the tip coiled in the SVC.  Original Report Authenticated By: Natasha Mead, M.D.   Dg Chest Port 1 View  10/14/2011  *RADIOLOGY REPORT*  Clinical Data: Intubation  PORTABLE CHEST - 1 VIEW  Comparison: Portable exam 0655 hours compared to 10/13/2011  Findings: Tip of endotracheal tube 5.0 cm above carina. Nasogastric tube extends into stomach. Right arm PICC line, remains coiled in right jugular vein. Normal heart size, mediastinal contours, and pulmonary vascularity. Left lower lobe atelectasis versus consolidation. Remaining lungs clear. No pneumothorax.  IMPRESSION: Left lower lobe atelectasis versus consolidation. Right arm PICC line remains coiled in the right jugular vein; recommend withdrawal/placement.  Findings called to [facility] on 3100 on 10/14/2011 at 0835 hours.  Original Report Authenticated By: Lollie Marrow, M.D.   Dg Chest Port 1 View  10/13/2011  *RADIOLOGY REPORT*  Clinical Data: PICC line placement  PORTABLE CHEST - 1 VIEW  Comparison: Prior films same day  Findings: Endotracheal tube in place with tip 4 cm above the carina.  Again noted malposition of the right PICC line with the tip looping in the right jugular vein.  Probable small hernia or tenting of  the left hemidiaphragm.  No diagnostic pneumothorax.  IMPRESSION:  Again noted malposition of the right PICC line with the tip looping in the right jugular vein.  Probable small hernia or tenting of the left hemidiaphragm.  No diagnostic pneumothorax.  Original Report Authenticated By: Natasha Mead, M.D.    Assessment/Plan: Continue supportive care.  PEG pending.    LOS: 8 days    Dorian Heckle, MD 10/15/2011, 8:07 AM

## 2011-10-15 NOTE — Telephone Encounter (Signed)
Have been following via chart notes. Thank you.

## 2011-10-15 NOTE — Procedures (Signed)
Proc:  PICC line repositioning RUE PICC tip in IJ vein.  Catheter withdrawn slightly then advanced over wire.  Tip at cavoatrial junction.

## 2011-10-16 ENCOUNTER — Encounter (HOSPITAL_COMMUNITY): Payer: Self-pay | Admitting: Gastroenterology

## 2011-10-16 ENCOUNTER — Inpatient Hospital Stay (HOSPITAL_COMMUNITY): Payer: Medicare Other

## 2011-10-16 ENCOUNTER — Encounter (HOSPITAL_COMMUNITY)
Admission: EM | Disposition: A | Discharge status: Skilled Nursing Facility | Payer: Self-pay | Source: Home / Self Care | Attending: Pulmonary Disease

## 2011-10-16 DIAGNOSIS — J9600 Acute respiratory failure, unspecified whether with hypoxia or hypercapnia: Secondary | ICD-10-CM

## 2011-10-16 DIAGNOSIS — I629 Nontraumatic intracranial hemorrhage, unspecified: Secondary | ICD-10-CM

## 2011-10-16 DIAGNOSIS — R4182 Altered mental status, unspecified: Secondary | ICD-10-CM

## 2011-10-16 HISTORY — PX: PEG PLACEMENT: SHX5437

## 2011-10-16 LAB — CBC
Hemoglobin: 12.8 g/dL — ABNORMAL LOW (ref 13.0–17.0)
MCH: 31 pg (ref 26.0–34.0)
Platelets: 235 10*3/uL (ref 150–400)
RBC: 4.13 MIL/uL — ABNORMAL LOW (ref 4.22–5.81)
WBC: 9.6 10*3/uL (ref 4.0–10.5)

## 2011-10-16 LAB — GLUCOSE, CAPILLARY
Glucose-Capillary: 125 mg/dL — ABNORMAL HIGH (ref 70–99)
Glucose-Capillary: 127 mg/dL — ABNORMAL HIGH (ref 70–99)
Glucose-Capillary: 130 mg/dL — ABNORMAL HIGH (ref 70–99)
Glucose-Capillary: 131 mg/dL — ABNORMAL HIGH (ref 70–99)
Glucose-Capillary: 132 mg/dL — ABNORMAL HIGH (ref 70–99)
Glucose-Capillary: 136 mg/dL — ABNORMAL HIGH (ref 70–99)

## 2011-10-16 LAB — BASIC METABOLIC PANEL
CO2: 20 mEq/L (ref 19–32)
GFR calc non Af Amer: 87 mL/min — ABNORMAL LOW (ref >90)
Glucose, Bld: 135 mg/dL — ABNORMAL HIGH (ref 70–99)
Potassium: 3.7 mEq/L (ref 3.5–5.1)
Sodium: 138 mEq/L (ref 135–145)

## 2011-10-16 SURGERY — INSERTION, PEG TUBE
Anesthesia: Moderate Sedation

## 2011-10-16 MED ORDER — MIDAZOLAM HCL 2 MG/2ML IJ SOLN
INTRAMUSCULAR | Status: DC | PRN
  Administered 2011-10-16: 2 mg via INTRAVENOUS

## 2011-10-16 MED ORDER — PROMOTE PO LIQD
1000.0000 mL | ORAL | Status: DC
  Administered 2011-10-16 – 2011-10-24 (×13): 1000 mL
  Filled 2011-10-16 (×19): qty 1000

## 2011-10-16 MED ORDER — MIDAZOLAM HCL 2 MG/2ML IJ SOLN
INTRAMUSCULAR | Status: AC
  Filled 2011-10-16: qty 6

## 2011-10-16 MED ORDER — FENTANYL CITRATE 0.05 MG/ML IJ SOLN
INTRAMUSCULAR | Status: DC | PRN
  Administered 2011-10-16: 25 ug via INTRAVENOUS

## 2011-10-16 MED ORDER — FENTANYL CITRATE 0.05 MG/ML IJ SOLN
INTRAMUSCULAR | Status: AC
  Filled 2011-10-16: qty 2

## 2011-10-16 MED ORDER — CEFAZOLIN SODIUM 1-5 GM-% IV SOLN
1.0000 g | Freq: Once | INTRAVENOUS | Status: AC
  Administered 2011-10-16: 1 g via INTRAVENOUS
  Filled 2011-10-16: qty 50

## 2011-10-16 NOTE — Consult Note (Signed)
  The recent consult H&P (dated 10/15/11) was reviewed, the patient was examined and there is no change in the patients condition since that consult note was completed.   Maniah Nading M  10/16/2011, 11:36 AM

## 2011-10-16 NOTE — Progress Notes (Signed)
Stroke Team Progress Note  SUBJECTIVE [name] is a 70 y.o. male who remains neurologically unchanged. Wife at bedside. Concerned with future long-term plans.  OBJECTIVE Most recent Vital Signs: Temp: 100.6 F (38.1 C) (12/06 0759) Temp src: Core (Comment) (12/06 0759) BP: 124/71 mmHg (12/06 0759) Pulse Rate: 87  (12/06 0759) Respiratory Rate: 12 O2 Saturdation: 100%  CBG (last 3)   Basename 10/16/11 0350 10/16/11 0009 10/15/11 1933  GLUCAP 132* 136* 127*   Intake/Output from previous day: 12/05 0701 - 12/06 0700 In: 865 [I.V.:240; NG/GT:410; IV Piggyback:215] Out: 2785 [Urine:2785]  IV Fluid Intake     . sodium chloride 20 mL/hr at 10/16/11 0700  . feeding supplement (PROMOTE) 1,000 mL (10/15/11 1149)   Diet  NPO for PEG  Activity  Bedrest  DVT Prophylaxis  SCDs   Studies BASIC METABOLIC PANEL     Status: Abnormal   Collection Time   10/16/11  3:40 AM      Component Value Range   Sodium 138  135 - 145 (mEq/L)   Potassium 3.7  3.5 - 5.1 (mEq/L)   Chloride 106  96 - 112 (mEq/L)   CO2 20  19 - 32 (mEq/L)   Glucose, Bld 135 (*) 70 - 99 (mg/dL)   BUN 27 (*) 6 - 23 (mg/dL)   Creatinine, Ser 9.60  0.50 - 1.35 (mg/dL)   Calcium 8.7  8.4 - 45.4 (mg/dL)   GFR calc non Af Amer 87 (*) >90 (mL/min)   GFR calc Af Amer >90  >90 (mL/min)  CBC     Status: Abnormal   Collection Time   10/16/11  3:40 AM      Component Value Range   WBC 9.6  4.0 - 10.5 (K/uL)   RBC 4.13 (*) 4.22 - 5.81 (MIL/uL)   Hemoglobin 12.8 (*) 13.0 - 17.0 (g/dL)   HCT 09.8 (*) 11.9 - 52.0 (%)   MCV 91.8  78.0 - 100.0 (fL)   MCH 31.0  26.0 - 34.0 (pg)   MCHC 33.8  30.0 - 36.0 (g/dL)   RDW 14.7  82.9 - 56.2 (%)   Platelets 235  150 - 400 (K/uL)    EEG  This is an EEG that is characterized by general background  slowing. This finding is consistent with a diffuse disturbance that is  etiologically nonspecific, but may include a metabolic encephalopathy  versus postictal state among other  possibilities. No epileptiform  activity was noted. There was an absence of normal sleep transients.   Physical Exam  Intubated. On ventilator. Not waking up. No agitation. Localizing on the right. Left side hemiplegic. Heart rate regular. Breath sounds clear. No seizure activity noted. Left eye Swelling is less today and can easily be opened  Neurological exam drowsy but arouses today and opens eyes partially. Pupils 3 mm reactive. Corneal reflexes present.few side-to-side spontaneous eye movements. Doll's eye movements are sluggish.not following commands Purposeful withdrawal of the right upper and lower extremity to pain. Moves left upper extremity less. Does withdraw both lower extremity to pain much more today. Today able to maintain tone in both lower extremities against gravity Both plantars are equivocal   ASSESSMENT [name] is a 70 y.o. male with a large left SDH and 1.4 cm left to right shift with subfalcine and uncal herniation and cerebral edema post evacuation by Dr. Venetia Maxon. Hemorrhage secondary to ruptured smal left pareital cortical small AVM from coughing. He has shown very slow but definite improvement.  Still unclear as to degree of recovery and eventual quality of life, looks less optimistic. No seizures. [name] has been placed. PEG planned for today.   Stroke risk factors: family history and hypertension  Hospital day # 9  TREATMENT/PLAN PEG today. [facility] for ventilator wean and long term care., discussed with wife. Dr. Delton Coombes will place formal consult.  [name], ANP-BC, GNP-BC [facility] Pager: [phone] 10/16/2011 8:12 AM  Dr. Delia Heady, Stroke Center Medical Director, has personally reviewed chart, pertinent data, examined the patient and developed the plan of care.

## 2011-10-16 NOTE — Op Note (Signed)
[facility] [address] [place], [place]  [address]  OPERATIVE PROCEDURE REPORT  PATIENT:  Gregory Humphrey, Gregory Humphrey  MR#:  [id number] BIRTHDATE:  12-03-1940, 70 yrs. old  GENDER:  male ENDOSCOPIST:  Carie Caddy. Demeka Sutter, MD PROCEDURE DATE:  10/16/2011 PROCEDURE:  EGD with PEG placement ASA CLASS:  Class III INDICATIONS:  1) neurogenic dysphagia MEDICATIONS:   Fentanyl 25 mcg IV, Versed 2 mg IV TOPICAL ANESTHETIC:  none  DESCRIPTION OF PROCEDURE:  After the risks benefits and alternatives of the procedure were thoroughly explained, informed consent was obtained.  The Pentax Gastroscope [id number] endoscope was introduced through the mouth and advanced to the second portion of the duodenum, without limitations.  The instrument was slowly withdrawn as the mucosa was fully examined.  The esophagus and gastroesophageal junction were completely normal in appearance.  Mild gastritis was found in the body and the antrum of the stomach.  The duodenal bulb was normal in appearance, as was the postbulbar duodenum.  A nasoenteric feeding tube was in place and then removed. <<PROCEDUREIMAGES>>  The stomach was then inflated with air, and by a combination of transillumination and manual palpation, the site for the gastrostomy tube placement was selected and marked on the anterior abdominal wall.  The skin of the anterior abdomen was surgically prepped and draped with sterile towels.  Utilizing strict sterile technique, the selected site was then anesthetized with 4 mL 1% xylocaine by injection into the skin and subcutaneous tissue.  A 1 cm incision was made through the skin and subcutaneous tissue, and the needle/cannula assembly was then passed through the abdominal wall and through the anterior wall of the stomach, maintaining visualization with the endoscope.  A snare device previously placed through the instrument channel was then opened and placed around the cannula, the needle  was removed, and the insertion wire was passed through the cannula and into the stomach lumen.  The snare was then loosened from the cannula, and repositioned to snare the insertion wire.  The snare was then pulled up to the endoscope distal tip, and the scope was then withdrawn bringing with it the snare and insertion wire.  The insertion wire was then released from the snare, and then loop-attached to the [employer] Pull PEG gastrostomy tube. Using the "pull technique", the G-tube was then pulled into place by traction on the insertion wire at the abdominal wall end. The G-tube insertion site was then cleansed once again, and the external bolster was placed over the tube to secure it to the abdominal wall.  A sterile dressing was then applied, and the procedure terminated.  COMPLICATIONS:  None  ENDOSCOPIC IMPRESSION: 1) Normal esophagus 2) Mild gastritis in the body and the antrum of the stomach 3) Normal duodenum 4) Successfully placed PEG tube  RECOMMENDATIONS: 1) PEG tube can be used for water and medications now. 2) Can resume TFs and progress to goal after 6 hours. 3) If mental status, alertness improves, then consider abdominal binder to prevent accidental removal. 4) GI consult team will see patient tomorrow.  Carie Caddy. Gilberto Stanforth, MD  CC:  none  n. eSIGNED:   Carie Caddy. January Bergthold at 10/16/2011 01:47 PM  Viviano Simas, [phone]

## 2011-10-16 NOTE — Progress Notes (Signed)
   CARE MANAGEMENT NOTE 10/16/2011  Patient:  MAICOL, BOWLAND   Account Number:  [id number]  Date Initiated:  10/08/2011  Documentation initiated by:  [facility]  Subjective/Objective Assessment:   large acute subdural, subarachnoid and parenchimal hemorrhage     Action/Plan:   Anticipated DC Date:  10/14/2011   Anticipated DC Plan:  [facility]  In-house referral  Clinical Social Worker      DC Planning Services  CM consult      Choice offered to / List presented to:             Status of service:  In process, will continue to follow Medicare Important Message given?   (If response is "NO", the following Medicare IM given date fields will be blank) Date Medicare IM given:   Date Additional Medicare IM given:    Discharge Disposition:    Per UR Regulation:  Reviewed for med. necessity/level of care/duration of stay  Comments:  10/16/11 Javionna Leder,RN,BSN ASKED TO EVALUATE PT FOR LTAC...PT'S INSURANCE DOES NOT RECOGNIZE LTAC AND WILL NOT APPROVE FOR DISPOSITION.  WILL HAVE TO EXPLORE VENT SNF VS REGULAR SNF ONCE WEANED.  WILL NOTIFY CSW OF THIS. Phone #[phone]  10-05-11 11:20am Avie Arenas, RNBSN - [phone] UR Completed.

## 2011-10-16 NOTE — Progress Notes (Addendum)
Patient ID: Gregory Humphrey, male   DOB: 1941/08/14, 70 y.o.   MRN: [id number] Subjective: Patient reports unresponsive and on vent.  Awaiting PEG.  Objective: Vital signs in last 24 hours: Temp:  [100 F (37.8 C)-101.3 F (38.5 C)] 100.4 F (38 C) (12/06 0700) Pulse Rate:  [67-80] 73  (12/06 0700) Resp:  [19-21] 20  (12/06 0700) BP: (116-152)/(63-78) 118/70 mmHg (12/06 0700) SpO2:  [97 %-99 %] 98 % (12/06 0700) FiO2 (%):  [29.8 %-99 %] 29.9 % (12/06 0700)  Intake/Output from previous day: 12/05 0701 - 12/06 0700 In: 865 [I.V.:240; NG/GT:410; IV Piggyback:215] Out: 2785 [Urine:2785] Intake/Output this shift:    Physical Exam: Remains ventilated and unresponsive.  Some eye opening in response to pain. Purposeful movement of upper extremities, right greater than left and withdrawal of legs.  Lab Results:  [facility] 10/16/11 0340 10/15/11 0348  WBC 9.6 9.2  HGB 12.8* 12.5*  HCT 37.9* 36.1*  PLT 235 225   BMET  Basename 10/16/11 0340 10/15/11 0348  NA 138 138  K 3.7 3.8  CL 106 104  CO2 20 23  GLUCOSE 135* 127*  BUN 27* 25*  CREATININE 0.84 0.91  CALCIUM 8.7 8.9    Studies/Results: Dg Abd 1 View  10/15/2011  *RADIOLOGY REPORT*  Clinical Data: Feeding tube placement under fluoroscopy  ABDOMEN - 1 VIEW  Comparison: None  Findings: Single digital C-arm fluoroscopic image obtained. Image demonstrates tip of feeding tube in the left upper quadrant. Contrast has been injected and opacifies the lumen of a jejunal loop compatible with placement of feeding tube beyond ligament of Treitz. Bowel wall thickening of the distal portion of the opacified loop.  IMPRESSION: Tip of feeding tube is beyond ligament Treitz in the proximal jejunum. Bowel wall thickening of the observed jejunal loop, uncertain etiology.  Original Report Authenticated By: Lollie Marrow, M.D.   Ir Fluoro Rm 30-60 Min  10/15/2011  *RADIOLOGY REPORT*  Clinical Data: Malpositioned PICC line placed at the bedside  with tip coiled in the right internal jugular vein.  REPOSITIONING OF PICC LINE UNDER FLUOROSCOPY  Comparison:  Chest x-ray earlier today.  Fluoroscopy Time: 0.2 minutes.  Procedure:  The procedure, risks, benefits, and alternatives were explained to the patient.  Questions regarding the procedure were encouraged and answered.  The patient understands and consents to the procedure.  The right PICC line was prepped with chlorhexidine in a sterile fashion, and a sterile drape was applied covering the operative field.  A sterile gown and sterile gloves were used for the procedure.  Preexisting PICC line was withdrawn slightly under fluoroscopy.  A guidewire was advanced into one of the lumens.  The PICC line was then re-advanced.  Fluoroscopic spot image was obtained to confirm catheter position.  Both lumens were flushed with saline.  Complications: None  Findings: Persistent coiling of the distal catheter in the base of the internal jugular vein.  The catheter was successfully relocated with the tip at the cavoatrial junction.  IMPRESSION: Repositioning of right upper extremity PICC line under fluoroscopy. After partial withdrawal and re-advancement over a guidewire, the catheter tip lies at the cavoatrial junction.  Original Report Authenticated By: Reola Calkins, M.D.   Dg Chest Port 1 View  10/15/2011  *RADIOLOGY REPORT*  Clinical Data: Respiratory difficulty  PORTABLE CHEST - 1 VIEW  Comparison: 10/14/2011  Findings: Left basilar atelectasis verses airspace disease stable. Lower lung volumes.  Stable endotracheal tube. Right PICC is not coiled in the right  internal jugular vein.  No pneumothorax.  IMPRESSION: Right PICC coiled in the right internal jugular vein.  Stable left basilar atelectasis verses airspace disease.  Original Report Authenticated By: Donavan Burnet, M.D.   Chest Portable 1 View To Assess Tube Placement And Rule-out Pneumothorax  10/14/2011  *RADIOLOGY REPORT*  Clinical Data:  Tracheostomy tube placement, rule out pneumothorax  PORTABLE CHEST - 1 VIEW  Comparison: Prior films same day  Findings: Cardiomediastinal silhouette is stable.  There is a tracheostomy tube with tip 5.3 cm above the carina.  Right arm PICC line is noted with tip coiled in the SVC.  There is no diagnostic pneumothorax.  No acute infiltrate or edema.  IMPRESSION: Tracheostomy tube in place.  No diagnostic pneumothorax.  Right arm PICC line with the tip coiled in the SVC.  Original Report Authenticated By: Natasha Mead, M.D.   Dg Naso G Tube Plc W/fl-no Rad  10/15/2011  CLINICAL DATA: RNs have been unable to place on the floor.   NASO G TUBE PLACEMENT WITH FLUORO  Fluoroscopy was utilized by the requesting physician.  No radiographic  interpretation.      Assessment/Plan: Continue supportive care.  Awaiting PEG later today. Minimal improvement in neurologic status.    LOS: 9 days    Rande Dario D, MD 10/16/2011, 8:00 AM

## 2011-10-16 NOTE — Progress Notes (Signed)
Dobhoff tube removed.

## 2011-10-16 NOTE — Progress Notes (Signed)
Name: Gregory Humphrey MRN: [id number] DOB: 06/12/1941  DOS: 10/07/2011    LOS: 9  CRITICAL CARE CONSULT FOLLOWUP NOTE  History of Present Illness: 70 y/o man with h/o HTN brought to [facility] ED unresponsive after complaining of headache earlier.  Intubated for airway protection.  Head CT demonstrated large acute subdural, subarachnoid and parenchimal hemorrhage. OR crani 11/28.  Lines / Drains: 11/27  ETT>>> 12/5 12/2 RUE PICC >>  12/5 #8 trach (DF) >> 11/27  Foley>>> A line rt rad 11/28 >>>12/2  Cultures / Sepsis markers: Sputum 11.28 >>> strep pneumo (PCN sensitive)  Antibiotics: (concern CAP  Vs asp) Ceftriaxone 11/28 >>> 12/5 levoflox 11/28 >>12/2  Tests / Events: 11/27  Found unresponsive.  Head CT demonstrated large acute subdural, subarachnoid and parenchimal hemorrhage. 11/28- poor neuro exam 11/28- OR drain SDH 11/28- CT angio, no aneursym 11/28 Craniotomy for spontaneous SDH evacuation  Subjective:  Temp 101.5 yesterday 12/5, ceftriaxone stopped 12/5 RT reports pt was apneic on PSV this am 12/6  Vital Signs:  BP 127/67  Pulse 73  Temp(Src) 100.8 F (38.2 C) (Core (Comment))  Resp 13  Ht 5\' 10"  (1.778 m)  Wt 89.3 kg (196 lb 13.9 oz)  BMI 28.25 kg/m2  SpO2 99% I/O last 3 completed shifts: In: 1105 [I.V.:480; NG/GT:410; IV Piggyback:215] Out: 3620 [Urine:3620] Total I/O In: 20 [I.V.:20] Out: 160 [Urine:160]  Physical Examination: Gen: on vent, no sedation, no acute distress HEENT: head dressing in place, pupils 2-3 mm, reactive bilat, trach in place PULM: rhonchi R > L CV: RRR, no mgr, no JVD AB: BS+, soft, nontender, no hsm Ext: warm, pitting edema, no clubbing, no cyanosis Derm: no rash or skin breakdown Neuro: somnolent on vent, spontaneous movements all over,   CBC    Component Value Date/Time   WBC 9.6 10/16/2011 0340   RBC 4.13* 10/16/2011 0340   HGB 12.8* 10/16/2011 0340   HCT 37.9* 10/16/2011 0340   PLT 235 10/16/2011 0340   MCV 91.8  10/16/2011 0340   MCH 31.0 10/16/2011 0340   MCHC 33.8 10/16/2011 0340   RDW 12.8 10/16/2011 0340   LYMPHSABS 0.9 10/10/2011 0415   MONOABS 0.9 10/10/2011 0415   EOSABS 0.0 10/10/2011 0415   BASOSABS 0.0 10/10/2011 0415    BMET    Component Value Date/Time   NA 138 10/16/2011 0340   K 3.7 10/16/2011 0340   CL 106 10/16/2011 0340   CO2 20 10/16/2011 0340   GLUCOSE 135* 10/16/2011 0340   BUN 27* 10/16/2011 0340   CREATININE 0.84 10/16/2011 0340   CALCIUM 8.7 10/16/2011 0340   GFRNONAA 87* 10/16/2011 0340   GFRAA >90 10/16/2011 0340   ABG    Component Value Date/Time   PHART 7.450 10/13/2011 0405   PCO2ART 37.1 10/13/2011 0405   PO2ART 107.0* 10/13/2011 0405   HCO3 25.3* 10/13/2011 0405   TCO2 26.4 10/13/2011 0405   ACIDBASEDEF 1.0 10/09/2011 0550   O2SAT 98.8 10/13/2011 0405   RADIOLOGY: Dg Abd 1 View  10/15/2011  *RADIOLOGY REPORT*  Clinical Data: Feeding tube placement under fluoroscopy  ABDOMEN - 1 VIEW  Comparison: None  Findings: Single digital C-arm fluoroscopic image obtained. Image demonstrates tip of feeding tube in the left upper quadrant. Contrast has been injected and opacifies the lumen of a jejunal loop compatible with placement of feeding tube beyond ligament of Treitz. Bowel wall thickening of the distal portion of the opacified loop.  IMPRESSION: Tip of feeding tube is beyond ligament Treitz in the  proximal jejunum. Bowel wall thickening of the observed jejunal loop, uncertain etiology.  Original Report Authenticated By: Lollie Marrow, M.D.   Ir Fluoro Rm 30-60 Min  10/15/2011  *RADIOLOGY REPORT*  Clinical Data: Malpositioned PICC line placed at the bedside with tip coiled in the right internal jugular vein.  REPOSITIONING OF PICC LINE UNDER FLUOROSCOPY  Comparison:  Chest x-ray earlier today.  Fluoroscopy Time: 0.2 minutes.  Procedure:  The procedure, risks, benefits, and alternatives were explained to the patient.  Questions regarding the procedure were encouraged and answered.   The patient understands and consents to the procedure.  The right PICC line was prepped with chlorhexidine in a sterile fashion, and a sterile drape was applied covering the operative field.  A sterile gown and sterile gloves were used for the procedure.  Preexisting PICC line was withdrawn slightly under fluoroscopy.  A guidewire was advanced into one of the lumens.  The PICC line was then re-advanced.  Fluoroscopic spot image was obtained to confirm catheter position.  Both lumens were flushed with saline.  Complications: None  Findings: Persistent coiling of the distal catheter in the base of the internal jugular vein.  The catheter was successfully relocated with the tip at the cavoatrial junction.  IMPRESSION: Repositioning of right upper extremity PICC line under fluoroscopy. After partial withdrawal and re-advancement over a guidewire, the catheter tip lies at the cavoatrial junction.  Original Report Authenticated By: Reola Calkins, M.D.   Dg Chest Port 1 View  10/16/2011  *RADIOLOGY REPORT*  Clinical Data: Tracheostomy, evaluate central venous line  PORTABLE CHEST - 1 VIEW  Comparison: Portable chest x-ray of 10/15/2011  Findings: Tracheostomy is present and unchanged in position.  A right upper extremity PICC line is present with the tip seen.  The expected SVC - RA junction.  Artifactual opacities overlie the right chest.  No focal infiltrate is seen.  There may be a left effusion present.  Mild cardiomegaly is stable.  Feeding tube is noted.  IMPRESSION:  1.  Tracheostomy unchanged in position. 2.  Right PICC line tip near expected SVC/RA junction.  Original Report Authenticated By: Juline Patch, M.D.   Dg Chest Port 1 View  10/15/2011  *RADIOLOGY REPORT*  Clinical Data: Respiratory difficulty  PORTABLE CHEST - 1 VIEW  Comparison: 10/14/2011  Findings: Left basilar atelectasis verses airspace disease stable. Lower lung volumes.  Stable endotracheal tube. Right PICC is not coiled in the right  internal jugular vein.  No pneumothorax.  IMPRESSION: Right PICC coiled in the right internal jugular vein.  Stable left basilar atelectasis verses airspace disease.  Original Report Authenticated By: Donavan Burnet, M.D.   Chest Portable 1 View To Assess Tube Placement And Rule-out Pneumothorax  10/14/2011  *RADIOLOGY REPORT*  Clinical Data: Tracheostomy tube placement, rule out pneumothorax  PORTABLE CHEST - 1 VIEW  Comparison: Prior films same day  Findings: Cardiomediastinal silhouette is stable.  There is a tracheostomy tube with tip 5.3 cm above the carina.  Right arm PICC line is noted with tip coiled in the SVC.  There is no diagnostic pneumothorax.  No acute infiltrate or edema.  IMPRESSION: Tracheostomy tube in place.  No diagnostic pneumothorax.  Right arm PICC line with the tip coiled in the SVC.  Original Report Authenticated By: Natasha Mead, M.D.   Dg Naso G Tube Plc W/fl-no Rad  10/15/2011  CLINICAL DATA: RNs have been unable to place on the floor.   NASO G TUBE PLACEMENT WITH FLUORO  Fluoroscopy was utilized by the requesting physician.  No radiographic  interpretation.      Assessment and Plan: 1) Large acute subdural hemorrhage with herniation. Now s/p crani, drainage.  Exam slowly improving. Dr Pearlean Brownie has discussed prognosis and goals with pt's wife, planning for extended support to see if he can have a significant neuro recovery. Will require LTAC level care vs SNF (if able to wean off MV). - will consult case management today 12/6 for consideration of LTAC (vs SNF)  2) Seizures risk -- keppra increased per neurology, following for seizure activity, none noted  3) Acute ventilator dependent respiratory failure due to neuro status --Cont full support, trach placed 12/5. Anticipate he will need at least airway protection and possibly MV support for weeks to months to fully assess plateau in neuro recovery. Will work towards PSV and hopefully soon if he can tolerate.  4) Possible PNA  - pneumococcus in sputum --completed ceftriaxone monotherapy, day 8 of 8 12/5 --follow temp curve  5) HTN emergency: BP OK off nicardipine  6) hyperkalemia, resolved Lab Results  Component Value Date   NA 138 10/16/2011   K 3.7 10/16/2011   CL 106 10/16/2011   CO2 20 10/16/2011   7) Nutrition  - panda placed under fluoro 12/4, restart TFs - [facility] for PEG placement on 12/6  8) IV access - PICC successfully repositioned 12/5  Best practices / Disposition: -->ICU status under PCCM, Neurosurgery and Neuro following -->DNR, aggressive care otherwise -->SCDs for DVT Px -->Protonix for GI Px -->TF - PEG 12/6 -->family updated at bedside   Starlynn Klinkner S. 10/16/2011, 9:36 AM

## 2011-10-16 NOTE — Brief Op Note (Signed)
EGD performed.  Mild gastritis (body and antrum). Successful placement of 24 Fr PEG via the pull method. Nasoenteric tube removed. See report for full details.

## 2011-10-16 NOTE — Progress Notes (Signed)
Clinical Child psychotherapist received a call for pt's wife, Fleet Contras.  CSW provided emotional support and encouragement.  CSW validated feelings assosicated with hospitalizations.  CSW provided opportunity for wife to process difficult feelings related to current situation.  CSW spent time answering questions and concerns related to dc planning.  CSW reviewed pt's coping skills and support system.  Wife noted that she is working closely with her pastor to "take one day at a time".  CSW to continue to follow and assist as needed.   Angelia Mould, MSW, [place] [phone]

## 2011-10-17 ENCOUNTER — Encounter (HOSPITAL_COMMUNITY): Payer: Self-pay | Admitting: Internal Medicine

## 2011-10-17 DIAGNOSIS — I629 Nontraumatic intracranial hemorrhage, unspecified: Secondary | ICD-10-CM

## 2011-10-17 DIAGNOSIS — R4182 Altered mental status, unspecified: Secondary | ICD-10-CM

## 2011-10-17 DIAGNOSIS — J9600 Acute respiratory failure, unspecified whether with hypoxia or hypercapnia: Secondary | ICD-10-CM

## 2011-10-17 LAB — BASIC METABOLIC PANEL WITH GFR
BUN: 28 mg/dL — ABNORMAL HIGH (ref 6–23)
CO2: 22 meq/L (ref 19–32)
Calcium: 8.7 mg/dL (ref 8.4–10.5)
Chloride: 110 meq/L (ref 96–112)
Creatinine, Ser: 0.78 mg/dL (ref 0.50–1.35)
GFR calc Af Amer: >90 mL/min
GFR calc non Af Amer: >90 mL/min
Glucose, Bld: 169 mg/dL — ABNORMAL HIGH (ref 70–99)
Potassium: 3.7 meq/L (ref 3.5–5.1)
Sodium: 142 meq/L (ref 135–145)

## 2011-10-17 LAB — GLUCOSE, CAPILLARY
Glucose-Capillary: 134 mg/dL — ABNORMAL HIGH (ref 70–99)
Glucose-Capillary: 135 mg/dL — ABNORMAL HIGH (ref 70–99)
Glucose-Capillary: 138 mg/dL — ABNORMAL HIGH (ref 70–99)
Glucose-Capillary: 139 mg/dL — ABNORMAL HIGH (ref 70–99)
Glucose-Capillary: 147 mg/dL — ABNORMAL HIGH (ref 70–99)
Glucose-Capillary: 153 mg/dL — ABNORMAL HIGH (ref 70–99)

## 2011-10-17 LAB — BLOOD GAS, ARTERIAL
Bicarbonate: 22.5 mEq/L (ref 20.0–24.0)
Drawn by: 34779
O2 Saturation: 97.6 %
PEEP: 5 cmH2O
RATE: 12 resp/min
pCO2 arterial: 33.3 mmHg — ABNORMAL LOW (ref 35.0–45.0)
pH, Arterial: 7.45 (ref 7.350–7.450)
pO2, Arterial: 93 mmHg (ref 80.0–100.0)

## 2011-10-17 LAB — CULTURE, BLOOD (ROUTINE X 2)
Culture  Setup Time: 201212010156
Culture: NO GROWTH
Culture: NO GROWTH

## 2011-10-17 LAB — CBC
Hemoglobin: 12.4 g/dL — ABNORMAL LOW (ref 13.0–17.0)
MCH: 31.4 pg (ref 26.0–34.0)
Platelets: 242 10*3/uL (ref 150–400)
RBC: 3.95 MIL/uL — ABNORMAL LOW (ref 4.22–5.81)
WBC: 11.7 10*3/uL — ABNORMAL HIGH (ref 4.0–10.5)

## 2011-10-17 MED ORDER — SENNOSIDES 8.8 MG/5ML PO SYRP
5.0000 mL | ORAL_SOLUTION | Freq: Two times a day (BID) | ORAL | Status: DC
  Administered 2011-10-18 – 2011-10-24 (×13): 5 mL
  Filled 2011-10-17 (×16): qty 5

## 2011-10-17 MED ORDER — DOCUSATE SODIUM 50 MG/5ML PO LIQD
50.0000 mg | Freq: Two times a day (BID) | ORAL | Status: DC
  Administered 2011-10-18 – 2011-10-20 (×5): 50 mg
  Administered 2011-10-20: 11:00:00
  Administered 2011-10-21 – 2011-10-22 (×3): 50 mg
  Administered 2011-10-22: 22:00:00
  Administered 2011-10-23: 50 mg
  Administered 2011-10-23: 23:00:00
  Administered 2011-10-24: 50 mg
  Filled 2011-10-17 (×16): qty 10

## 2011-10-17 MED FILL — Chlorhexidine Gluconate Liquid 4%: CUTANEOUS | Qty: 30 | Status: AC

## 2011-10-17 NOTE — Progress Notes (Signed)
Name: RONDAL VANDEVELDE MRN: [id number] DOB: May 04, 1941  DOS: 10/07/2011    LOS: 10  CRITICAL CARE CONSULT FOLLOWUP NOTE  History of Present Illness: 70 y/o man with h/o HTN brought to [facility] ED unresponsive after complaining of headache earlier.  Intubated for airway protection.  Head CT demonstrated large acute subdural, subarachnoid and parenchimal hemorrhage. OR crani 11/28.  Lines / Drains: 11/27  ETT>>> 12/5 12/2 RUE PICC >>  12/5 #8 trach (DF) >> 11/27  Foley>>> A line rt rad 11/28 >>>12/2  Cultures / Sepsis markers: Sputum 11.28 >>> strep pneumo (PCN sensitive)  Antibiotics: (concern CAP  Vs asp) Ceftriaxone 11/28 >>> 12/5 levoflox 11/28 >>12/2  Tests / Events: 11/27  Found unresponsive.  Head CT demonstrated large acute subdural, subarachnoid and parenchimal hemorrhage. 11/28- poor neuro exam 11/28- OR drain SDH 11/28- CT angio, no aneursym 11/28 Craniotomy for spontaneous SDH evacuation  Subjective:  Temp 101.5 yesterday 12/5, ceftriaxone stopped 12/5 RT reports pt was apneic on PSV this am 12/6  Vital Signs:  BP 136/67  Pulse 84  Temp(Src) 100.6 F (38.1 C) (Core (Comment))  Resp 26  Ht 5\' 10"  (1.778 m)  Wt 88.6 kg (195 lb 5.2 oz)  BMI 28.03 kg/m2  SpO2 99% I/O last 3 completed shifts: In: 1247 [I.V.:470; Other:420; NG/GT:200; IV Piggyback:157] Out: 2485 [Urine:2485] Total I/O In: 130 [I.V.:50; Other:80] Out: 135 [Urine:135]  Physical Examination: Gen: on vent, no sedation, no acute distress HEENT: head dressing in place, pupils 2-3 mm, reactive bilat, trach in place PULM: rhonchi R > L CV: RRR, no mgr, no JVD AB: BS+, soft, nontender, no hsm Ext: warm, pitting edema, no clubbing, no cyanosis Derm: no rash or skin breakdown Neuro: somnolent on vent, spontaneous movements all over,   CBC    Component Value Date/Time   WBC 11.7* 10/17/2011 0411   RBC 3.95* 10/17/2011 0411   HGB 12.4* 10/17/2011 0411   HCT 36.6* 10/17/2011 0411   PLT 242 10/17/2011  0411   MCV 92.7 10/17/2011 0411   MCH 31.4 10/17/2011 0411   MCHC 33.9 10/17/2011 0411   RDW 12.6 10/17/2011 0411   LYMPHSABS 0.9 10/10/2011 0415   MONOABS 0.9 10/10/2011 0415   EOSABS 0.0 10/10/2011 0415   BASOSABS 0.0 10/10/2011 0415    BMET    Component Value Date/Time   NA 142 10/17/2011 0411   K 3.7 10/17/2011 0411   CL 110 10/17/2011 0411   CO2 22 10/17/2011 0411   GLUCOSE 169* 10/17/2011 0411   BUN 28* 10/17/2011 0411   CREATININE 0.78 10/17/2011 0411   CALCIUM 8.7 10/17/2011 0411   GFRNONAA >90 10/17/2011 0411   GFRAA >90 10/17/2011 0411   ABG    Component Value Date/Time   PHART 7.450 10/17/2011 0505   PCO2ART 33.3* 10/17/2011 0505   PO2ART 93.0 10/17/2011 0505   HCO3 22.5 10/17/2011 0505   TCO2 23.4 10/17/2011 0505   ACIDBASEDEF 0.8 10/17/2011 0505   O2SAT 97.6 10/17/2011 0505   RADIOLOGY: Ir Fluoro Rm 30-60 Min  10/15/2011  *RADIOLOGY REPORT*  Clinical Data: Malpositioned PICC line placed at the bedside with tip coiled in the right internal jugular vein.  REPOSITIONING OF PICC LINE UNDER FLUOROSCOPY  Comparison:  Chest x-ray earlier today.  Fluoroscopy Time: 0.2 minutes.  Procedure:  The procedure, risks, benefits, and alternatives were explained to the patient.  Questions regarding the procedure were encouraged and answered.  The patient understands and consents to the procedure.  The right PICC line was prepped  with chlorhexidine in a sterile fashion, and a sterile drape was applied covering the operative field.  A sterile gown and sterile gloves were used for the procedure.  Preexisting PICC line was withdrawn slightly under fluoroscopy.  A guidewire was advanced into one of the lumens.  The PICC line was then re-advanced.  Fluoroscopic spot image was obtained to confirm catheter position.  Both lumens were flushed with saline.  Complications: None  Findings: Persistent coiling of the distal catheter in the base of the internal jugular vein.  The catheter was successfully relocated  with the tip at the cavoatrial junction.  IMPRESSION: Repositioning of right upper extremity PICC line under fluoroscopy. After partial withdrawal and re-advancement over a guidewire, the catheter tip lies at the cavoatrial junction.  Original Report Authenticated By: Reola Calkins, M.D.   Dg Chest Port 1 View  10/16/2011  *RADIOLOGY REPORT*  Clinical Data: Tracheostomy, evaluate central venous line  PORTABLE CHEST - 1 VIEW  Comparison: Portable chest x-ray of 10/15/2011  Findings: Tracheostomy is present and unchanged in position.  A right upper extremity PICC line is present with the tip seen.  The expected SVC - RA junction.  Artifactual opacities overlie the right chest.  No focal infiltrate is seen.  There may be a left effusion present.  Mild cardiomegaly is stable.  Feeding tube is noted.  IMPRESSION:  1.  Tracheostomy unchanged in position. 2.  Right PICC line tip near expected SVC/RA junction.  Original Report Authenticated By: Juline Patch, M.D.    Assessment and Plan: 1) Large acute subdural hemorrhage with herniation. Now s/p crani, drainage.  Exam slowly improving. Dr Pearlean Brownie has discussed prognosis and goals with pt's wife, planning for extended support to see if he can have a significant neuro recovery. Will require LTAC level care vs SNF (if able to wean off MV). - will consult case management 12/6 for consideration of LTAC vs SNF  2) Seizures risk -- keppra increased per neurology, following for seizure activity, none noted  3) Acute ventilator dependent respiratory failure due to neuro status --tolerating TC this am 12/7; will attempt to go 24x7, and if successful can transfer out of ICU on 12/8  4) Possible PNA - pneumococcus in sputum --completed ceftriaxone monotherapy, day 8 of 8 12/5 --follow temp curve  5) HTN emergency: BP OK off nicardipine  6) hyperkalemia, resolved Lab Results  Component Value Date   NA 142 10/17/2011   K 3.7 10/17/2011   CL 110 10/17/2011    CO2 22 10/17/2011   7) Nutrition  - panda placed under fluoro 12/4, TFs being increased to goal - [facility], s/p PEG placement on 12/6  8) IV access - PICC successfully repositioned 12/5  Best practices / Disposition: -->ICU status under PCCM, Neurosurgery and Neuro following; to floor 12/8 if tolerates TC -->DNR, aggressive care otherwise -->SCDs for DVT Px -->Protonix for GI Px -->TF - PEG 12/6 -->family updated at bedside   Levy Pupa, MD, PhD 10/17/2011, 10:51 AM [facility] [phone] or if no answer [phone]

## 2011-10-17 NOTE — Progress Notes (Signed)
I agree with the above documentation, including the assessment and plan.  

## 2011-10-17 NOTE — Progress Notes (Signed)
     [facility] Daily Rounding Note 10/17/2011, 9:28 AM  SUBJECTIVE: No problems overnite.  Tube feeds running at 50 ml per hour.  OBJECTIVE: Unresponsive on vent otherwise looks well.  Vital signs in last 24 hours: Temp:  [99.1 F (37.3 C)-100.8 F (38.2 C)] 100.6 F (38.1 C) (12/07 0833) Pulse Rate:  [62-87] 84  (12/07 0833) Resp:  [4-22] 21  (12/07 0833) BP: (70-138)/(41-84) 133/70 mmHg (12/07 0833) SpO2:  [95 %-100 %] 99 % (12/07 0833) FiO2 (%):  [28 %-30.2 %] 28 % (12/07 0833) Weight:  [88.6 kg (195 lb 5.2 oz)] 195 lb 5.2 oz (88.6 kg) (12/07 0500)   Heart: RRR Chest: Clear, trach in place Abdomen: Soft, active BS.  PEG site with scant dry blood, NT, overall benign appearing.   TF infusing.  Extremities: Edema in arms B Neuro/Psych:  unresponsive  Intake/Output from previous day: 12/06 0701 - 12/07 0700 In: 1077 [I.V.:470; NG/GT:30; IV Piggyback:157] Out: 1455 [Urine:1455]  Intake/Output this shift: Total I/O In: 70 [I.V.:20; Other:50] Out: 125 [Urine:125]  Lab Results:  [facility] 10/17/11 0411 10/16/11 0340 10/15/11 0348  WBC 11.7* 9.6 9.2  HGB 12.4* 12.8* 12.5*  HCT 36.6* 37.9* 36.1*  PLT 242 235 225   BMET  Basename 10/17/11 0411 10/16/11 0340 10/15/11 0348  NA 142 138 138  K 3.7 3.7 3.8  CL 110 106 104  CO2 22 20 23   GLUCOSE 169* 135* 127*  BUN 28* 27* 25*  CREATININE 0.78 0.84 0.91  CALCIUM 8.7 8.7 8.9   ASSESMENT: 1. One day post PEG placement, no complications thus far.  2.  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  VDRF.  Improved 4.  Fevers, CNS related.   PLAN: 1.  Wound care to PEG site. 2.  Will sign off   LOS: 10 days   Jennye Moccasin  10/17/2011, 9:28 AM Pager: [phone]

## 2011-10-17 NOTE — Progress Notes (Signed)
Stroke Team Progress Note  SUBJECTIVE Gregory Humphrey is a 70 y.o. male who feels their symptoms are gradually improving. His wife is at the bedside. He has been on trach colla rsince this am and had peg yesterday. OBJECTIVE Most recent Vital Signs: Temp: 100.4 F (38 C) (12/07 0800) BP: 125/71 mmHg (12/07 0800) Pulse Rate: 80  (12/07 0800) Respiratory Rate: 16 O2 Saturdation: 99%  CBG (last 3)   Basename 10/17/11 0343 10/17/11 0041 10/16/11 1938  GLUCAP 138* 139* 130*   Intake/Output from previous day: 12/06 0701 - 12/07 0700 In: 1077 [I.V.:470; NG/GT:30; IV Piggyback:157] Out: 1455 [Urine:1455]  IV Fluid Intake     . sodium chloride 20 mL/hr at 10/16/11 0700  . feeding supplement (PROMOTE) 1,000 mL (10/16/11 1804)   Diet  NPO on tube feedings  Activity  bedrest  DVT Prophylaxis  SCDs   Studies BASIC METABOLIC PANEL     Status: Abnormal   Collection Time   10/17/11  4:11 AM      Component Value Range   Sodium 142  135 - 145 (mEq/L)   Potassium 3.7  3.5 - 5.1 (mEq/L)   Chloride 110  96 - 112 (mEq/L)   CO2 22  19 - 32 (mEq/L)   Glucose, Bld 169 (*) 70 - 99 (mg/dL)   BUN 28 (*) 6 - 23 (mg/dL)   Creatinine, Ser 9.60  0.50 - 1.35 (mg/dL)   Calcium 8.7  8.4 - 45.4 (mg/dL)   GFR calc non Af Amer >90  >90 (mL/min)   GFR calc Af Amer >90  >90 (mL/min)  CBC     Status: Abnormal   Collection Time   10/17/11  4:11 AM      Component Value Range   WBC 11.7 (*) 4.0 - 10.5 (K/uL)   RBC 3.95 (*) 4.22 - 5.81 (MIL/uL)   Hemoglobin 12.4 (*) 13.0 - 17.0 (g/dL)   HCT 09.8 (*) 11.9 - 52.0 (%)   MCV 92.7  78.0 - 100.0 (fL)   MCH 31.4  26.0 - 34.0 (pg)   MCHC 33.9  30.0 - 36.0 (g/dL)   RDW 14.7  82.9 - 56.2 (%)   Platelets 242  150 - 400 (K/uL)    Ir Fluoro Rm 30-60 Min 10/15/2011  Repositioning of right upper extremity PICC line under fluoroscopy. After partial withdrawal and re-advancement over a guidewire, the catheter tip lies at the cavoatrial junction.   Dg Chest Port  1 View 10/16/2011  1.  Tracheostomy unchanged in position. 2.  Right PICC line tip near expected SVC/RA junction.    Physical Exam  On trach collar. Still not waking up. No agitation.   Heart rate regular. Breath sounds clear. Peg site healthy. Neurological exam drowsy but arouses today and opens eyes partially. Pupils 3 mm reactive. Corneal reflexes present.few side-to-side spontaneous eye movements. Doll's eye movements are sluggish.not following commands Purposeful withdrawal of the right upper and lower extremity to pain. Moves left upper extremity less. Does withdraw both lower extremity to pain much more today. Today able to maintain tone in both lower extremities against gravity Both plantars are equivocal   ASSESSMENT Gregory Humphrey is a 70 y.o. male with a large left SDH and 1.4 cm left to right shift with subfalcine and uncal herniation and cerebral edema post evacuation by Dr. Venetia Maxon. Hemorrhage secondary to ruptured smal left pareital cortical small AVM from coughing. He has shown very slow but definite improvement. Still unclear as to degree  of recovery and eventual quality of life, looks less optimistic. No seizures. Trach and PEG have been placed, stable after PEG placement yesterday.   Stroke risk factors:  family history and hypertension  Hospital day # 10  TREATMENT/PLAN Continue ventilator weaning. Social worker for SNF placement as it appears [facility] will not be needed due to weaning off ventilator.. Out of bed. PT and OT evaluations in preparation for rehabilitation. D/w wife and Dr Delton Coombes.  [name], ANP-BC, GNP-BC [facility] Pager: [phone] 10/17/2011 8:33 AM  Dr. Delia Heady, Stroke Center Medical Director, has personally reviewed chart, pertinent data, examined the patient and developed the plan of care.

## 2011-10-17 NOTE — Progress Notes (Signed)
Subjective: Patient reports Unresponsive. Trach collar.  Objective: Vital signs in last 24 hours: Temp:  [99.1 F (37.3 C)-100.8 F (38.2 C)] 100.6 F (38.1 C) (12/07 1000) Pulse Rate:  [62-87] 83  (12/07 1000) Resp:  [4-26] 17  (12/07 1000) BP: (70-139)/(41-84) 139/76 mmHg (12/07 1000) SpO2:  [95 %-100 %] 99 % (12/07 1000) FiO2 (%):  [28 %-30.2 %] 28 % (12/07 0833) Weight:  [88.6 kg (195 lb 5.2 oz)] 195 lb 5.2 oz (88.6 kg) (12/07 0500)  Intake/Output from previous day: 12/06 0701 - 12/07 0700 In: 1077 [I.V.:470; NG/GT:30; IV Piggyback:157] Out: 1455 [Urine:1455] Intake/Output this shift: Total I/O In: 130 [I.V.:50; Other:80] Out: 135 [Urine:135]  Now with trach collar & PEG. Little change neurologically. Occas random movement with upper extremities reported, ?purposeful.  Lab Results:  [facility] 10/17/11 0411 10/16/11 0340  WBC 11.7* 9.6  HGB 12.4* 12.8*  HCT 36.6* 37.9*  PLT 242 235   BMET  Basename 10/17/11 0411 10/16/11 0340  NA 142 138  K 3.7 3.7  CL 110 106  CO2 22 20  GLUCOSE 169* 135*  BUN 28* 27*  CREATININE 0.78 0.84  CALCIUM 8.7 8.7    Studies/Results: Dg Chest Port 1 View  10/16/2011  *RADIOLOGY REPORT*  Clinical Data: Tracheostomy, evaluate central venous line  PORTABLE CHEST - 1 VIEW  Comparison: Portable chest x-ray of 10/15/2011  Findings: Tracheostomy is present and unchanged in position.  A right upper extremity PICC line is present with the tip seen.  The expected SVC - RA junction.  Artifactual opacities overlie the right chest.  No focal infiltrate is seen.  There may be a left effusion present.  Mild cardiomegaly is stable.  Feeding tube is noted.  IMPRESSION:  1.  Tracheostomy unchanged in position. 2.  Right PICC line tip near expected SVC/RA junction.  Original Report Authenticated By: Juline Patch, M.D.    Assessment/Plan: Incision w/o erythema, drainage, swelling. Staples intact. Minimal change neuro. Now with trach collar & PEG.   LOS: 10 days  In discussions [id number] from ICU Monday. Continue support.   Georgiann Cocker 10/17/2011, 11:01 AM     I had a long discussion (30 minutes) with patient's wife about prognosis and whether patient will exhibit meaningful improvement.  Currently he has not made great improvements in his neurologic status.

## 2011-10-17 NOTE — Progress Notes (Signed)
Omni-flex adapter changed

## 2011-10-18 LAB — GLUCOSE, CAPILLARY
Glucose-Capillary: 144 mg/dL — ABNORMAL HIGH (ref 70–99)
Glucose-Capillary: 167 mg/dL — ABNORMAL HIGH (ref 70–99)
Glucose-Capillary: 154 mg/dL — ABNORMAL HIGH (ref 70–99)
Glucose-Capillary: 130 mg/dL — ABNORMAL HIGH (ref 70–99)
Glucose-Capillary: 134 mg/dL — ABNORMAL HIGH (ref 70–99)

## 2011-10-18 MED ORDER — LEVETIRACETAM 100 MG/ML PO SOLN
750.0000 mg | Freq: Two times a day (BID) | ORAL | Status: DC
  Administered 2011-10-18 – 2011-10-24 (×13): 750 mg via ORAL
  Filled 2011-10-18 (×15): qty 7.5

## 2011-10-18 MED ORDER — PANTOPRAZOLE SODIUM 40 MG PO PACK
40.0000 mg | PACK | ORAL | Status: DC
  Administered 2011-10-18 – 2011-10-24 (×7): 40 mg
  Filled 2011-10-18 (×8): qty 20

## 2011-10-18 NOTE — Progress Notes (Signed)
Name: Gregory Humphrey MRN: [id number] DOB: 16-Jul-1941  DOS: 10/07/2011    LOS: 11  CRITICAL CARE CONSULT FOLLOWUP NOTE  History of Present Illness: 70 y/o man with h/o HTN brought to [facility] ED unresponsive after complaining of headache earlier.  Intubated for airway protection.  Head CT demonstrated large acute subdural, subarachnoid and parenchimal hemorrhage. OR crani 11/28.  Lines / Drains: 11/27  ETT>>> 12/5 12/2 RUE PICC >>  12/5 #8 trach (DF) >> 11/27  Foley>>> A line rt rad 11/28 >>>12/2 PEG (Pyrtle) 12/06>>  Cultures / Sepsis markers: Sputum 11.28 >>> strep pneumo (PCN sensitive)  Antibiotics: (concern CAP  Vs asp) Ceftriaxone 11/28 >>> 12/5 levoflox 11/28 >>12/2  Tests / Events: 11/27  Found unresponsive.  Head CT demonstrated large acute subdural, subarachnoid and parenchimal hemorrhage. 11/28- OR drain SDH 11/28- CT angio, no aneursym 11/28 Craniotomy for spontaneous SDH evacuation  Subjective:    Vital Signs:  BP 130/63  Pulse 77  Temp(Src) 100.2 F (37.9 C) (Core (Comment))  Resp 27  Ht 5\' 10"  (1.778 m)  Wt 189 lb 2.5 oz (85.8 kg)  BMI 27.14 kg/m2  SpO2 99% I/O last 3 completed shifts: In: 3317.5 [I.V.:740; NWGNF:6213; NG/GT:330; IV Piggyback:117.5] Out: 1822 [Urine:1822]    Physical Examination: Gen: on vent, no sedation, no acute distress HEENT: head dressing in place, pupils 2-3 mm, reactive bilat, trach in place PULM: rhonchi R > L CV: RRR, no mgr, no JVD AB: BS+, soft, nontender, no hsm Ext: warm, pitting edema, no clubbing, no cyanosis Derm: no rash or skin breakdown Neuro: somnolent on vent, spontaneous movements all over,   CBC    Component Value Date/Time   WBC 11.7* 10/17/2011 0411   RBC 3.95* 10/17/2011 0411   HGB 12.4* 10/17/2011 0411   HCT 36.6* 10/17/2011 0411   PLT 242 10/17/2011 0411   MCV 92.7 10/17/2011 0411   MCH 31.4 10/17/2011 0411   MCHC 33.9 10/17/2011 0411   RDW 12.6 10/17/2011 0411   LYMPHSABS 0.9 10/10/2011 0415   MONOABS 0.9 10/10/2011 0415   EOSABS 0.0 10/10/2011 0415   BASOSABS 0.0 10/10/2011 0415    BMET    Component Value Date/Time   NA 142 10/17/2011 0411   K 3.7 10/17/2011 0411   CL 110 10/17/2011 0411   CO2 22 10/17/2011 0411   GLUCOSE 169* 10/17/2011 0411   BUN 28* 10/17/2011 0411   CREATININE 0.78 10/17/2011 0411   CALCIUM 8.7 10/17/2011 0411   GFRNONAA >90 10/17/2011 0411   GFRAA >90 10/17/2011 0411   ABG    Component Value Date/Time   PHART 7.450 10/17/2011 0505   PCO2ART 33.3* 10/17/2011 0505   PO2ART 93.0 10/17/2011 0505   HCO3 22.5 10/17/2011 0505   TCO2 23.4 10/17/2011 0505   ACIDBASEDEF 0.8 10/17/2011 0505   O2SAT 97.6 10/17/2011 0505   RADIOLOGY: No results found.  Assessment and Plan: 1) Large acute subdural hemorrhage with herniation. Now s/p crani, drainage.  Exam slowly improving. Dr Pearlean Brownie has discussed prognosis and goals with pt's wife, planning for extended support to see if he can have a significant neuro recovery. Will require LTAC level care vs SNF (if able to wean off MV). - consulted case management 12/6 for consideration of LTAC vs SNF  2) Seizures risk -- keppra per neurology  3) Acute ventilator dependent respiratory failure due to neuro status -- tolerating trach collar for > 36 hours  4) Possible PNA - pneumococcus in sputum --completed ceftriaxone monotherapy, day 8 of 8  12/5 --follow temp curve  5) HTN emergency: BP OK off nicardipine  6) hyperkalemia, resolved  7) Nutrition  -- [facility], s/p PEG placement on 12/6 -- tolerating tube feeds  8) IV access - PICC successfully repositioned 12/5  9) Hyperglycemia -- SSI  Best practices / Disposition: -->Transfer to non tele medical floor bed -->DNR, aggressive care otherwise -->SCDs for DVT Px -->Protonix for GI Px -->TF - PEG 12/6 -->family updated at bedside  Miranda Garber 10/18/2011, 9:05 AM Pager:  [phone]

## 2011-10-18 NOTE — Progress Notes (Signed)
Subjective: 70 year old white male with a history of a left subdural hematoma, status post craniotomy. The patient remains lethargic, minimally responsive. The patient is followed by neurosurgery, critical care. Wife is at bedside. The patient has a PEG tube in place, and has a trach.  Objective: Vital signs in last 24 hours: Temp:  [98.8 F (37.1 C)-100.9 F (38.3 C)] 100.2 F (37.9 C) (12/08 0700) Pulse Rate:  [70-91] 77  (12/08 0700) Resp:  [17-28] 27  (12/08 0700) BP: (106-143)/(56-78) 130/63 mmHg (12/08 0700) SpO2:  [97 %-100 %] 99 % (12/08 0758) FiO2 (%):  [28 %] 28 % (12/08 0758) Weight:  [85.8 kg (189 lb 2.5 oz)] 189 lb 2.5 oz (85.8 kg) (12/08 0440) Weight change: -2.8 kg (-6 lb 2.8 oz)    Intake/Output from previous day: 12/07 0701 - 12/08 0700 In: 2617.5 [I.V.:490; NG/GT:300; IV Piggyback:117.5] Out: 1117 [Urine:1117] Intake/Output this shift:    Review of systems cannot be obtained.   Physical Examination:  The patient is lethargic, difficult to arouse. Positive doll's eyes are noted. Pupils are equal, round, reactive.  The patient will move the upper extremity spontaneously. Movement of the lower extremities is not seen.  Deep tendon reflexes are depressed but symmetric.  The patient has minimal response to deep pain stimulation.  Patient will not follow commands, and cerebellar testing cannot be performed.    Lab Results:  [facility] 10/17/11 0411 10/16/11 0340  WBC 11.7* 9.6  HGB 12.4* 12.8*  HCT 36.6* 37.9*  PLT 242 235   BMET  Basename 10/17/11 0411 10/16/11 0340  NA 142 138  K 3.7 3.7  CL 110 106  CO2 22 20  GLUCOSE 169* 135*  BUN 28* 27*  CREATININE 0.78 0.84  CALCIUM 8.7 8.7    Studies/Results: No results found.  Medications:  Scheduled:   . antiseptic oral rinse  1 application Mouth Rinse QID  . chlorhexidine  15 mL Mouth/Throat BID  . sennosides  5 mL Per Tube BID   And  . docusate  50 mg Per Tube BID  . insulin aspart  0-9  Units Subcutaneous Q4H  . levetiracetam  750 mg Intravenous Q12H  . pantoprazole (PROTONIX) IV  40 mg Intravenous Q24H  . sodium chloride  10 mL Intracatheter Q12H  . DISCONTD: senna-docusate  1 tablet Oral BID   Continuous:   . sodium chloride 20 mL/hr at 10/18/11 0700  . feeding supplement (PROMOTE) 1,000 mL (10/18/11 0155)   ZOX:[id number], acetaminophen, fentaNYL, ondansetron (ZOFRAN) IV, sodium chloride  Assessment/Plan:  1. Left subdural hematoma, status post craniotomy  The patient remains quite lethargic, minimally responsive. The patient is receiving supportive care at this time, as a trach and PEG in place. Plans are to transfer the patient to a step down unit at this time. Neurosurgery is following this patient. The patient remains on Keppra for seizure prophylaxis.    LOS: 11 days   WILLIS,CHARLES KEITH 10/18/2011, 8:55 AM

## 2011-10-18 NOTE — Progress Notes (Signed)
Physical Therapy Evaluation Patient Details Name: Gregory Humphrey MRN: [id number] DOB: 20-Jun-1941 Today's Date: 10/18/2011  Problem List:  Patient Active Problem List  Diagnoses  . CARBOHYDRATE METABOLISM DISORDER  . HYPERCHOLESTEROLEMIA  . GILBERT'S SYNDROME  . ESSENTIAL HYPERTENSION, BENIGN  . RENAL CALCULUS, HX OF  . ICH (intracerebral hemorrhage)  . Acute respiratory failure    Past Medical History:  Past Medical History  Diagnosis Date  . Hypertension   . Gilbert's syndrome   . Kidney stones   . Olecranon bursitis    Past Surgical History:  Past Surgical History  Procedure Date  . Refractive surgery 1995  . Craniotomy 10/08/2011    Procedure: CRANIOTOMY HEMATOMA EVACUATION SUBDURAL;  Surgeon: Dorian Heckle, MD;  Location: [facility];  Service: Neurosurgery;  Laterality: Left;  Left Craniotomy for subdural  . Peg placement 10/16/2011    Procedure: PERCUTANEOUS ENDOSCOPIC GASTROSTOMY (PEG) PLACEMENT;  Surgeon: Erick Blinks, MD;  Location: [facility] ENDOSCOPY;  Service: Gastroenterology;  Laterality: N/A;    PT Assessment/Plan/Recommendation PT Assessment Clinical Impression Statement: Pt is a 70 y/o male s/p craniotomy for ICH along with the below PT problem list.  Pt would benfit from PT in the acute setting to decrease burden of care and facilitate d/c to SNF. PT Recommendation/Assessment: Patient will need skilled PT in the acute care venue PT Problem List: Decreased strength;Decreased activity tolerance;Decreased balance;Decreased mobility;Decreased cognition Barriers to Discharge: None PT Therapy Diagnosis : Altered mental status;Generalized weakness PT Plan PT Frequency: Min 3X/week PT Treatment/Interventions: Functional mobility training;Therapeutic activities;Balance training;Neuromuscular re-education;Cognitive remediation;Patient/family education PT Recommendation Follow Up Recommendations: Skilled nursing facility Equipment Recommended: Defer to next venue PT  Goals  Acute Rehab PT Goals PT Goal Formulation: With family Time For Goal Achievement: 2 weeks Pt will go Supine/Side to Sit: with max assist PT Goal: Supine/Side to Sit - Progress: Other (comment) (Set today.) Pt will go Sit to Supine/Side: with max assist PT Goal: Sit to Supine/Side - Progress: Other (comment) (Set today.) Pt will go Sit to Stand: with max assist PT Goal: Sit to Stand - Progress: Other (comment) (Set today.) Pt will go Stand to Sit: with max assist PT Goal: Stand to Sit - Progress: Other (comment) (Set today.) Pt will Transfer Bed to Chair/Chair to Bed: with max assist PT Transfer Goal: Bed to Chair/Chair to Bed - Progress: Other (comment) (Set today.)  PT Evaluation Precautions/Restrictions  Precautions Precautions: Fall Required Braces or Orthoses: No Restrictions Weight Bearing Restrictions: No Prior Functioning  Home Living Lives With: Spouse Type of Home: House Home Layout: One level Home Access: Stairs to enter Entrance Stairs-Rails: None Entrance Stairs-Number of Steps: Few Home Adaptive Equipment: None Prior Function Level of Independence: Independent with basic ADLs;Independent with homemaking with ambulation;Independent with gait;Independent with transfers Able to Take Stairs?: Yes Driving: Yes Cognition Cognition Arousal/Alertness: Lethargic (Exhibits withdrawl to painful stimuli and arouses at EOB.) Overall Cognitive Status: Impaired Attention: Impaired Current Attention Level: Focused Attention - Other Comments: Pt very lethargic throughout session.  Able to get eyes opened at EOB. Orientation Level: Disoriented X4 Following Commands: Other (comment) (Not able to follow commands. Able to spontaneously assist.) Problem Solving: Other (comment) (Unable to problem solve.) Sensation/Coordination Sensation Light Touch: Not tested Stereognosis: Not tested Hot/Cold: Not tested Proprioception: Not tested Coordination Gross Motor Movements  are Fluid and Coordinated: Yes (Spontaneously moving right UE, but no other extremities.) Fine Motor Movements are Fluid and Coordinated: Not tested Extremity Assessment RUE Assessment RUE Assessment: Not tested LUE Assessment LUE Assessment:  Not tested RLE Assessment RLE Assessment: Exceptions to [facility] RLE Strength RLE Overall Strength: Deficits RLE Overall Strength Comments: No spontaneous movement noted.  However, pt able to flex removing right LE from painful stimuli. LLE Assessment LLE Assessment: Exceptions to [facility] LLE Strength LLE Overall Strength: Deficits LLE Overall Strength Comments: Not spontaneous movement noted.  However, pt able to flex LE removing it from painful stimuli. Pain No pain signs or symptoms throughout all of treatment. Mobility (including Balance) Bed Mobility Bed Mobility: Yes Supine to Sit: 1: +1 Total assist;HOB elevated (Comment degrees) (HOB 60 degrees.) Supine to Sit Details (indicate cue type and reason): Assist for bilateral LEs to bring off EOB and for trunk to translate anterior over BOS.  Max cues for sequence throughout treatment. Sitting - Scoot to Edge of Bed: 1: +1 Total assist Sitting - Scoot to Edge of Bed Details (indicate cue type and reason): Assist to reciprocally scoot hips using pad with max cues for sequence. Sit to Supine - Left: 1: +1 Total assist;HOB flat Sit to Supine - Left Details (indicate cue type and reason): Assist to bilateral LEs to bring onto bed with assist at trunk to control slow lowering.  Max cues for sequence. Transfers Transfers: No Ambulation/Gait Ambulation/Gait: No Stairs: No Wheelchair Mobility Wheelchair Mobility: No  Posture/Postural Control Posture/Postural Control: No significant limitations Balance Balance Assessed: Yes Static Sitting Balance Static Sitting - Balance Support: Right upper extremity supported;Feet supported Static Sitting - Level of Assistance: 1: +1 Total assist (Pt able to progress  to max assist using right UE to help.) Static Sitting - Comment/# of Minutes: 15 (Assist to shift weight anterior over BOS tilting pelvis.) Exercise    End of Session PT - End of Session Activity Tolerance: Patient limited by fatigue Patient left: in bed;with call bell in reach;with family/visitor present Nurse Communication: Need for lift equipment General Behavior During Session: Lethargic Cognition: Impaired  Cephus Shelling 10/18/2011, 2:33 PM  10/18/2011 Cephus Shelling, PT, DPT [phone]

## 2011-10-18 NOTE — Progress Notes (Signed)
Patient ID: Gregory Humphrey, male   DOB: 08/10/1941, 70 y.o.   MRN: [id number] Subjective: Patient reports Unresponsive  Objective: Vital signs in last 24 hours: Temp:  [98.8 F (37.1 C)-100.9 F (38.3 C)] 100.2 F (37.9 C) (12/08 0700) Pulse Rate:  [70-91] 77  (12/08 0700) Resp:  [17-28] 27  (12/08 0700) BP: (106-143)/(56-78) 130/63 mmHg (12/08 0700) SpO2:  [97 %-100 %] 99 % (12/08 0758) FiO2 (%):  [28 %] 28 % (12/08 0758) Weight:  [85.8 kg (189 lb 2.5 oz)] 189 lb 2.5 oz (85.8 kg) (12/08 0440)  Intake/Output from previous day: 12/07 0701 - 12/08 0700 In: 2617.5 [I.V.:490; NG/GT:300; IV Piggyback:117.5] Out: 1117 [Urine:1117] Intake/Output this shift:    Eyes closed. Some weak localization  Lab Results:  Basename 10/17/11 0411 10/16/11 0340  WBC 11.7* 9.6  HGB 12.4* 12.8*  HCT 36.6* 37.9*  PLT 242 235   BMET  Basename 10/17/11 0411 10/16/11 0340  NA 142 138  K 3.7 3.7  CL 110 106  CO2 22 20  GLUCOSE 169* 135*  BUN 28* 27*  CREATININE 0.78 0.84  CALCIUM 8.7 8.7    Studies/Results: No results found.  Assessment/Plan: No changes. Continue present rx  LOS: 11 days  As above   Reinaldo Meeker, MD 10/18/2011, 10:08 AM

## 2011-10-19 DIAGNOSIS — R4182 Altered mental status, unspecified: Secondary | ICD-10-CM

## 2011-10-19 DIAGNOSIS — I629 Nontraumatic intracranial hemorrhage, unspecified: Secondary | ICD-10-CM

## 2011-10-19 DIAGNOSIS — J9600 Acute respiratory failure, unspecified whether with hypoxia or hypercapnia: Secondary | ICD-10-CM

## 2011-10-19 LAB — GLUCOSE, CAPILLARY
Glucose-Capillary: 144 mg/dL — ABNORMAL HIGH (ref 70–99)
Glucose-Capillary: 159 mg/dL — ABNORMAL HIGH (ref 70–99)

## 2011-10-19 NOTE — Progress Notes (Signed)
Subjective: No respiratory distress.  Still getting secretions from trach Remains unresponsive.  Had one low grade temp last night, but afebrile today  Objective: Vital signs in last 24 hours: Blood pressure 137/61, pulse 89, temperature 99.5 F (37.5 C), temperature source Oral, resp. rate 25, height 5\' 10"  (1.778 m), weight 85.8 kg (189 lb 2.5 oz), SpO2 95.00%.  Intake/Output from previous day: 12/08 0701 - 12/09 0700 In: 935 [I.V.:90] Out: 1265 [Urine:1265]   Physical Exam:   ow male in nad Chest with decreased depth inspir, no wheezing Cor with rrr abd soft and nontender, bs+ Unresponsive to voice or pain.    Lab Results:  [facility] 10/17/11 0411  WBC 11.7*  HGB 12.4*  HCT 36.6*  PLT 242   BMET  Basename 10/17/11 0411  NA 142  K 3.7  CL 110  CO2 22  GLUCOSE 169*  BUN 28*  CREATININE 0.78  CALCIUM 8.7    Studies/Results: No results found.  Assessment/Plan: Patient Active Hospital Problem List:  ICH (intracerebral hemorrhage) (10/08/2011)   Assessment: continue to have poor neuro status   Plan: per neuro Acute respiratory failure (10/13/2011)   Assessment: resolved.  Pt is doing well on TC.  Has been treated for possible pna, now off abx.  Only one isolated temp last night.  Will continue to monitor.    Plan: continue pulmonary toilet.     Barbaraann Share, M.D. 10/19/2011, 12:54 PM

## 2011-10-19 NOTE — Progress Notes (Signed)
Patient ID: Gregory Humphrey, male   DOB: 05/24/1941, 70 y.o.   MRN: [id number] Subjective: Patient reports Unresponsive  Objective: Vital signs in last 24 hours: Temp:  [98.2 F (36.8 C)-100.5 F (38.1 C)] 99.5 F (37.5 C) (12/09 0600) Pulse Rate:  [70-89] 89  (12/09 0800) Resp:  [22-28] 25  (12/09 0800) BP: (130-147)/(61-86) 137/61 mmHg (12/09 0600) SpO2:  [95 %-100 %] 95 % (12/09 0800) FiO2 (%):  [28 %] 28 % (12/09 0800)  Intake/Output from previous day: 12/08 0701 - 12/09 0700 In: 935 [I.V.:90] Out: 1265 [Urine:1265] Intake/Output this shift:    No changes  Lab Results:  Basename 10/17/11 0411  WBC 11.7*  HGB 12.4*  HCT 36.6*  PLT 242   BMET  Basename 10/17/11 0411  NA 142  K 3.7  CL 110  CO2 22  GLUCOSE 169*  BUN 28*  CREATININE 0.78  CALCIUM 8.7    Studies/Results: No results found.  Assessment/Plan: As per primary team  LOS: 12 days  As above   Reinaldo Meeker, MD 10/19/2011, 9:14 AM

## 2011-10-19 NOTE — Progress Notes (Signed)
Subjective: The patient remains stuporous. The patient will not respond to pain stimuli or sternal rub.  Family member is present at the time of examination.  Objective: Vital signs in last 24 hours: Temp:  [98.2 F (36.8 C)-100.5 F (38.1 C)] 99.5 F (37.5 C) (12/09 0600) Pulse Rate:  [70-89] 89  (12/09 0800) Resp:  [22-28] 25  (12/09 0800) BP: (130-147)/(61-86) 137/61 mmHg (12/09 0600) SpO2:  [95 %-100 %] 95 % (12/09 0800) FiO2 (%):  [28 %] 28 % (12/09 0800) Weight change:  Last BM Date: 10/16/11  Intake/Output from previous day: 12/08 0701 - 12/09 0700 In: 935 [I.V.:90] Out: 1265 [Urine:1265] Intake/Output this shift:    @ROS @  Review of systems is unobtainable.   Physical Examination:  The patient is stuporous, pupils are equal, round, and reactive. Minimal doll's eyes are seen.  Deep tendon reflexes are depressed but symmetric.  The patient does not respond significantly to painful stimuli on all 4 extremities, or to sternal rub.  Motor tone is somewhat decreased throughout.  And the patient would not cooperate for cerebellar testing. Patient will not follow commands.  Lab Results:  [facility] 10/17/11 0411  WBC 11.7*  HGB 12.4*  HCT 36.6*  PLT 242   BMET  Basename 10/17/11 0411  NA 142  K 3.7  CL 110  CO2 22  GLUCOSE 169*  BUN 28*  CREATININE 0.78  CALCIUM 8.7    Studies/Results: No results found.  Medications:  Scheduled:   . antiseptic oral rinse  1 application Mouth Rinse QID  . chlorhexidine  15 mL Mouth/Throat BID  . sennosides  5 mL Per Tube BID   And  . docusate  50 mg Per Tube BID  . insulin aspart  0-9 Units Subcutaneous Q4H  . levETIRAcetam  750 mg Oral Q12H  . pantoprazole sodium  40 mg Per Tube Q24H  . sodium chloride  10 mL Intracatheter Q12H   Continuous:   . sodium chloride 20 mL/hr at 10/18/11 1529  . feeding supplement (PROMOTE) 1,000 mL (10/19/11 0220)   WUJ:[id number], acetaminophen, fentaNYL, ondansetron  (ZOFRAN) IV, sodium chloride  Assessment/Plan:  1. Left subdural hematoma, status post craniotomy   The patient remains quite lethargic, minimally responsive. The patient is receiving supportive care at this time, as a trach and PEG in place. Neurosurgery is following this patient. The patient remains on Keppra for seizure prophylaxis.    LOS: 12 days   Chrisha Vogel KEITH 10/19/2011, 11:15 AM

## 2011-10-20 ENCOUNTER — Inpatient Hospital Stay (HOSPITAL_COMMUNITY): Payer: Medicare Other

## 2011-10-20 LAB — GLUCOSE, CAPILLARY
Glucose-Capillary: 175 mg/dL — ABNORMAL HIGH (ref 70–99)
Glucose-Capillary: 205 mg/dL — ABNORMAL HIGH (ref 70–99)
Glucose-Capillary: 162 mg/dL — ABNORMAL HIGH (ref 70–99)
Glucose-Capillary: 153 mg/dL — ABNORMAL HIGH (ref 70–99)

## 2011-10-20 LAB — CBC
HCT: 34.6 % — ABNORMAL LOW (ref 39.0–52.0)
MCV: 92.8 fL (ref 78.0–100.0)
RDW: 12.4 % (ref 11.5–15.5)
WBC: 8.8 10*3/uL (ref 4.0–10.5)

## 2011-10-20 LAB — BASIC METABOLIC PANEL
CO2: 26 mEq/L (ref 19–32)
Chloride: 108 mEq/L (ref 96–112)
Creatinine, Ser: 0.73 mg/dL (ref 0.50–1.35)

## 2011-10-20 MED ORDER — INSULIN ASPART 100 UNIT/ML ~~LOC~~ SOLN
3.0000 [IU] | SUBCUTANEOUS | Status: DC
  Administered 2011-10-20 – 2011-10-24 (×24): 3 [IU] via SUBCUTANEOUS

## 2011-10-20 NOTE — Progress Notes (Signed)
Occupational Therapy Evaluation Patient Details Name: LILTON PARE MRN: [id number] DOB: September 01, 1941 Today's Date: 10/20/2011  Problem List:  Patient Active Problem List  Diagnoses  . CARBOHYDRATE METABOLISM DISORDER  . HYPERCHOLESTEROLEMIA  . GILBERT'S SYNDROME  . ESSENTIAL HYPERTENSION, BENIGN  . RENAL CALCULUS, HX OF  . ICH (intracerebral hemorrhage)  . Acute respiratory failure    Past Medical History:  Past Medical History  Diagnosis Date  . Hypertension   . Gilbert's syndrome   . Kidney stones   . Olecranon bursitis    Past Surgical History:  Past Surgical History  Procedure Date  . Refractive surgery 1995  . Craniotomy 10/08/2011    Procedure: CRANIOTOMY HEMATOMA EVACUATION SUBDURAL;  Surgeon: Dorian Heckle, MD;  Location: [facility];  Service: Neurosurgery;  Laterality: Left;  Left Craniotomy for subdural  . Peg placement 10/16/2011    Procedure: PERCUTANEOUS ENDOSCOPIC GASTROSTOMY (PEG) PLACEMENT;  Surgeon: Erick Blinks, MD;  Location: [facility] ENDOSCOPY;  Service: Gastroenterology;  Laterality: N/A;    OT Assessment/Plan/Recommendation OT Assessment Clinical Impression Statement: Patient currently unresponsive except to painful stimuli. Will follow on a trial basis over the next 2 weeks. OT Recommendation/Assessment: Patient will need skilled OT in the acute care venue OT Problem List: Increased edema;Impaired UE functional use;Impaired tone;Impaired sensation;Decreased activity tolerance;Decreased range of motion;Decreased strength;Decreased cognition OT Therapy Diagnosis : Hemiplegia non-dominant side;Cognitive deficits OT Plan OT Frequency: Min 1X/week OT Treatment/Interventions: Patient/family education;Therapeutic exercise;Neuromuscular education;Therapeutic activities;Self-care/ADL training OT Recommendation Follow Up Recommendations: Skilled nursing facility Equipment Recommended: Defer to next venue Individuals Consulted Consulted and Agree with Results  and Recommendations: Family member/caregiver Family Member Consulted: wife OT Goals Acute Rehab OT Goals OT Goal Formulation: With family Time For Goal Achievement: 2 weeks Miscellaneous OT Goals Miscellaneous OT Goal #1: Patient will maintain eyes open while sitting EOB for greater than or equal to as precursor to ADLs.  OT Goal: Miscellaneous Goal #1 - Progress: Other (comment) Miscellaneous OT Goal #2: Pt. will maintain sitting balance at EOB with Max assist for greater than or equal to as precursor to ADLs OT Goal: Miscellaneous Goal #2 - Progress: Other (comment) Miscellaneous OT Goal #3: Patient will respond appropriately to 2/5 commands in prep for ADLs. OT Goal: Miscellaneous Goal #3 - Progress: Other (comment)  OT Evaluation Precautions/Restrictions  Precautions Precautions: Fall Required Braces or Orthoses: No Restrictions Weight Bearing Restrictions: No Prior Functioning     ADL ADL ADL Comments: Patient currently only responding to noxious pain stimuli. And is +2 total assist(pt=0%) for all ADL activity at this time.  Vision/Perception  Vision - Assessment Additional Comments: Per PT report, pt. did open eyes when sitting EOB with PT but patient continued to be unresponsive, will continue to attempt to assess as able. Cognition Cognition Arousal/Alertness: Unable to arouse Overall Cognitive Status: Impaired Attention: Impaired Current Attention Level:  (unresponsive to voice/command) Orientation Level:  (unresponsive) Following Commands:  (Unable to follow commands) Sensation/Coordination Sensation Additional Comments: Grimaces to pain on RUE but not to LUE Coordination Gross Motor Movements are Fluid and Coordinated: Not tested Fine Motor Movements are Fluid and Coordinated: Not tested Extremity Assessment RUE Assessment RUE Assessment:  (tone noted in right bicep with elbow extension) LUE Assessment LUE Assessment:  (flaccid) Mobility  Bed  Mobility Bed Mobility: Yes Supine to Sit: 1: +2 Total assist;Patient percentage (comment);HOB elevated (Comment degrees) (+2 total assist (pt<5%); HOB 60 degrees.) Supine to Sit Details (indicate cue type and reason): Assist to facilitate flexion and movement off EOB  of bilateral LEs.  Assist also to trunk to translate trunk anterior over BOS.  Max cues for sequence and to increase arousal/attention of pt with mobility. Sitting - Scoot to Edge of Bed: 1: +1 Total assist Sitting - Scoot to Edge of Bed Details (indicate cue type and reason): Assist to reciprocally scoot hips using pad with max cues to increase arousal/attention to task. Sit to Supine - Left: 1: +2 Total assist;Patient percentage (comment);HOB flat (+2 total assist (pt<5%)) Sit to Supine - Left Details (indicate cue type and reason): Assist to trunk to slowly lower to bed and for bilateral LEs to move onto bed.  Max cues to increase arousal/attention to task. Exercises General Exercises - Upper Extremity Shoulder Flexion:  (instructed wife on PROM exercises for bilateral UEs) End of Session OT - End of Session Activity Tolerance:  (unresponsive except to pain throughout) Patient left: in bed;with bed alarm set;with family/visitor present General Behavior During Session: Lethargic Cognition: Impaired   Purvi Ruehl 10/20/2011, 2:13 PM

## 2011-10-20 NOTE — Progress Notes (Signed)
Inpatient Diabetes Program Recommendations  AACE/ADA: New Consensus Statement on Inpatient Glycemic Control (2009)  Target Ranges:  Prepandial:   less than 140 mg/dL      Peak postprandial:   less than 180 mg/dL (1-2 hours)      Critically ill patients:  140 - 180 mg/dL   Reason for Visit: Elevated glucose:  140, 175, 205 mg/dL  Inpatient Diabetes Program Recommendations Insulin - Meal Coverage: Elevated glucose due to tube feeds:  If glucose remains elevated, consider Novolog 3 units q4hrs

## 2011-10-20 NOTE — Progress Notes (Signed)
Physical Therapy Treatment Patient Details Name: Gregory Humphrey MRN: [id number] DOB: 1941-03-13 Today's Date: 10/20/2011  PT Assessment/Plan  PT - Assessment/Plan Comments on Treatment Session: Pt opened eyes initially at EOB with supine>sit.  Only maintained eye opening for a few seconds.  Difficulty following commands and arousing for session. PT Plan: Discharge plan remains appropriate;Frequency remains appropriate PT Frequency: Min 3X/week Follow Up Recommendations: Skilled nursing facility Equipment Recommended: Defer to next venue PT Goals  Acute Rehab PT Goals PT Goal Formulation: With family Time For Goal Achievement: 2 weeks Pt will go Supine/Side to Sit: with max assist PT Goal: Supine/Side to Sit - Progress: Progressing toward goal Pt will go Sit to Supine/Side: with max assist PT Goal: Sit to Supine/Side - Progress: Progressing toward goal Pt will go Sit to Stand: with max assist PT Goal: Sit to Stand - Progress: Progressing toward goal Pt will go Stand to Sit: with max assist PT Goal: Stand to Sit - Progress: Progressing toward goal Pt will Transfer Bed to Chair/Chair to Bed: with max assist PT Transfer Goal: Bed to Chair/Chair to Bed - Progress: Progressing toward goal  PT Treatment Precautions/Restrictions  Precautions Precautions: Fall Required Braces or Orthoses: No Restrictions Weight Bearing Restrictions: No Pain No pain evident during treatment. Mobility (including Balance) Bed Mobility Bed Mobility: Yes Supine to Sit: 1: +2 Total assist;Patient percentage (comment);HOB elevated (Comment degrees) (+2 total assist (pt<5%); HOB 60 degrees.) Supine to Sit Details (indicate cue type and reason): Assist to facilitate flexion and movement off EOB of bilateral LEs.  Assist also to trunk to translate trunk anterior over BOS.  Max cues for sequence and to increase arousal/attention of pt with mobility. Sitting - Scoot to Edge of Bed: 1: +1 Total assist Sitting  - Scoot to Edge of Bed Details (indicate cue type and reason): Assist to reciprocally scoot hips using pad with max cues to increase arousal/attention to task. Sit to Supine - Left: 1: +2 Total assist;Patient percentage (comment);HOB flat (+2 total assist (pt<5%)) Sit to Supine - Left Details (indicate cue type and reason): Assist to trunk to slowly lower to bed and for bilateral LEs to move onto bed.  Max cues to increase arousal/attention to task. Transfers Transfers: No Ambulation/Gait Ambulation/Gait: No Stairs: No Wheelchair Mobility Wheelchair Mobility: No  Posture/Postural Control Posture/Postural Control: Postural limitations Postural Limitations: Decreased trunk strength causing trunk to flex collapsing forward and moving pelvis into a posterior pelvic tilt. Balance Balance Assessed: Yes Static Sitting Balance Static Sitting - Balance Support: No upper extremity supported;Feet supported Static Sitting - Level of Assistance: 1: +2 Total assist;Patient percentage (comment) (+2 total assist (pt<5%)) Static Sitting - Comment/# of Minutes: 10 (Assist to attempt to increase arousal and participation.) End of Session PT - End of Session Activity Tolerance: Patient limited by fatigue;Other (comment) (Limited by cognition/arousal.) Patient left: in bed;with call bell in reach;with family/visitor present Nurse Communication: Need for lift equipment General Behavior During Session: Lethargic Cognition: Impaired  Cephus Shelling 10/20/2011, 1:15 PM  10/20/2011 Cephus Shelling, PT, DPT [phone]

## 2011-10-20 NOTE — Progress Notes (Signed)
CSW received referral for possible SNF placement. Note pt also being considered for CIR. CSW spoke at length with pt sister, completed psychosocial assessment, and will follow up with pt wife tomorrow. CSW will continue to follow for any d/c planning needs as pt progresses.  Baxter Flattery, MSW [phone]

## 2011-10-20 NOTE — Progress Notes (Signed)
Name: Gregory Humphrey MRN: [id number] DOB: 1941-03-12  DOS: 10/07/2011    LOS: 13  CRITICAL CARE CONSULT FOLLOWUP NOTE  History of Present Illness: 70 y/o man with h/o HTN brought to [facility] ED 11/27 unresponsive after complaining of headache >  Intubated for airway protection.  Head CT demonstrated large acute subdural, subarachnoid and parenchimal hemorrhage. OR crani 11/28.  Lines / Drains: 11/27  ETT>>> 12/5  A line rt rad 11/28 >>>12/2 12/2 RUE PICC >>  12/5 #8 trach (DF) >> PEG (Pyrtle) 12/06>>  Cultures / Sepsis markers: BC x 2  11/28 > neg Sputum 11/28 >>> strep pneumo (PCN sensitive)  Antibiotics: (concern CAP  Vs asp) Ceftriaxone 11/28 >>> 12/5 levoflox 11/28 >>12/2  Tests / Events: 11/27  Found unresponsive.  Head CT demonstrated large acute subdural, subarachnoid and parenchimal hemorrhage. 11/28- OR drain SDH 11/28- CT angio, no aneursym 11/28 Craniotomy for spontaneous SDH evacuation  Subjective:    Vital Signs:  BP 142/79  Pulse 96  Temp(Src) 98.8 F (37.1 C) (Axillary)  Resp 20  Ht 5\' 10"  (1.778 m)  Wt 203 lb 11.3 oz (92.4 kg)  BMI 29.23 kg/m2  SpO2 95% I/O last 3 completed shifts: In: 80 [Other:80] Out: 1800 [Urine:1800]    Physical Examination: Gen:   no acute distress/ no response to verbal HEENT: head sutures clean and dry/ pupils 2-3 mm, reactive bilat, trach in place PULM: minimal bilateral rhonchi CV: RRR, no mgr, no JVD AB: BS+, soft, nontender, no hsm/ peg in place and working fine Ext: warm, pitting edema, no clubbing, no cyanosis Derm: no rash or skin breakdown Neuro: somnolent on vent, spontaneous movements all over,  Labs:  Lab 10/20/11 0545 10/17/11 0411 10/16/11 0340  NA 143 142 138  K 3.9 3.7 3.7  CL 108 110 106  CO2 26 22 20   BUN 21 28* 27*  CREATININE 0.73 0.78 0.84  GLUCOSE 182* 169* 135*    Lab 10/20/11 0545 10/17/11 0411 10/16/11 0340  HGB 11.2* 12.4* 12.8*  HCT 34.6* 36.6* 37.9*  WBC 8.8 11.7* 9.6  PLT 256  242 235      RADIOLOGY: No results found.  Assessment and Plan: 1) Large acute subdural hemorrhage with herniation. Now s/p crani, drainage. > needs placement per neuro/ trach and peg dep  2) Seizures risk -- keppra per neurology  3) Acute ventilator dependent respiratory failure due to neuro status -- T collar dep only  4) Possible PNA - pneumococcus in sputum --completed ceftriaxone 12/5  5) HTN emergency: BP OK off nicardipine  6) hyperkalemia, resolved  7) Nutrition  -- [facility], s/p PEG placement on 12/6 -- tolerating tube feeds  8) IV access - PICC successfully repositioned 12/5  9) Hyperglycemia -- SSI  Best practices / Disposition:  -->DNR, aggressive care otherwise -->SCDs for DVT Px -->Protonix for GI Px -->TF - PEG 12/6 -->family updated at bedside  Discussed with wife at bedside. She seems realistic re: no wishes for esccalation of care  but still hopeful for functional recovery.   Sandrea Hughs, MD Pulmonary and Critical Care Medicine [facility] Cell [phone]

## 2011-10-20 NOTE — Progress Notes (Signed)
Stroke Team Progress Note  SUBJECTIVE Gregory Humphrey is a 70 y.o. male whose symptoms are unchanged. He remains sleepy and unresponsive. His wife went to work today.  OBJECTIVE Most recent Vital Signs: Temp: 98.8 F (37.1 C) (12/10 0944) Temp src: Axillary (12/10 0944) BP: 142/79 mmHg (12/10 0944) Pulse Rate: 96  (12/10 1100) Respiratory Rate: 20 O2 Saturdation: 95%  CBG (last 3)   Basename 10/20/11 1139 10/20/11 0735 10/20/11 0349  GLUCAP 162* 205* 175*   Intake/Output from previous day: 12/09 0701 - 12/10 0700 In: -  Out: 1500 [Urine:1500]  IV Fluid Intake     . sodium chloride 20 mL/hr at 10/18/11 1529  . feeding supplement (PROMOTE) 1,000 mL (10/20/11 0535)   Diet    *tube feeding  Activity  Up with assistance  DVT Prophylaxis  SCDs  Studies Results for orders placed during the hospital encounter of 10/07/11 (from the past 24 hour(s))  GLUCOSE, CAPILLARY     Status: Abnormal   Collection Time   10/19/11  4:07 PM      Component Value Range   Glucose-Capillary 159 (*) 70 - 99 (mg/dL)   Comment 1 Documented in Chart     Comment 2 Notify RN    GLUCOSE, CAPILLARY     Status: Abnormal   Collection Time   10/19/11  8:06 PM      Component Value Range   Glucose-Capillary 130 (*) 70 - 99 (mg/dL)  GLUCOSE, CAPILLARY     Status: Abnormal   Collection Time   10/19/11 11:51 PM      Component Value Range   Glucose-Capillary 140 (*) 70 - 99 (mg/dL)  GLUCOSE, CAPILLARY     Status: Abnormal   Collection Time   10/20/11  3:49 AM      Component Value Range   Glucose-Capillary 175 (*) 70 - 99 (mg/dL)  BASIC METABOLIC PANEL     Status: Abnormal   Collection Time   10/20/11  5:45 AM      Component Value Range   Sodium 143  135 - 145 (mEq/L)   Potassium 3.9  3.5 - 5.1 (mEq/L)   Chloride 108  96 - 112 (mEq/L)   CO2 26  19 - 32 (mEq/L)   Glucose, Bld 182 (*) 70 - 99 (mg/dL)   BUN 21  6 - 23 (mg/dL)   Creatinine, Ser 1.61  0.50 - 1.35 (mg/dL)   Calcium 8.8  8.4 -  10.5 (mg/dL)   GFR calc non Af Amer >90  >90 (mL/min)   GFR calc Af Amer >90  >90 (mL/min)  CBC     Status: Abnormal   Collection Time   10/20/11  5:45 AM      Component Value Range   WBC 8.8  4.0 - 10.5 (K/uL)   RBC 3.73 (*) 4.22 - 5.81 (MIL/uL)   Hemoglobin 11.2 (*) 13.0 - 17.0 (g/dL)   HCT 09.6 (*) 04.5 - 52.0 (%)   MCV 92.8  78.0 - 100.0 (fL)   MCH 30.0  26.0 - 34.0 (pg)   MCHC 32.4  30.0 - 36.0 (g/dL)   RDW 40.9  81.1 - 91.4 (%)   Platelets 256  150 - 400 (K/uL)  GLUCOSE, CAPILLARY     Status: Abnormal   Collection Time   10/20/11  7:35 AM      Component Value Range   Glucose-Capillary 205 (*) 70 - 99 (mg/dL)   Comment 1 Notify RN     Comment 2  Documented in Chart    GLUCOSE, CAPILLARY     Status: Abnormal   Collection Time   10/20/11 11:39 AM      Component Value Range   Glucose-Capillary 162 (*) 70 - 99 (mg/dL)   Comment 1 Notify RN     Comment 2 Documented in Chart       No results found.  Physical Exam   The patient is lethargic, difficult to arouse. Positive doll's eyes are noted. Pupils are equal, round, reactive.  The patient will move the upper extremity spontaneously  Right more than left. Movement of the lower extremities is not seen.  Deep tendon reflexes are depressed but symmetric.  The patient has minimal response to deep pain stimulation.  Patient will not follow commands, and cerebellar testing cannot be performed.     ASSESSMENT Gregory Humphrey is a 70 y.o. male with a large left SDH and 1.4 cm left to right shift with subfalcine and uncal herniation and cerebral edema post evacuation by Dr. Venetia Maxon. Hemorrhage secondary to ruptured smal left pareital cortical small AVM from coughing. He continue status showed no improvement with ongoing lethargy and unresponsiveness. Concerned with wife patient has not awakening.  Hyperglycemia secondary to tube feedings  Stroke risk factors: family history and hypertension  Hospital day #  13  TREATMENT/PLAN Repeat CT scan and EEG to look for potential causes of ongoing lethargy. Insulin 3 units every 4 hours as recommended by diabetes nurse for elevated glucoses.  [name], ANP-BC, GNP-BC [facility] Pager: [phone] 10/20/2011 1:45 PM  Dr. Delia Heady, Stroke Center Medical Director, has personally reviewed chart, pertinent data, examined the patient and developed the plan of care.

## 2011-10-20 NOTE — Progress Notes (Signed)
   CARE MANAGEMENT NOTE 10/20/2011  Patient:  Gregory Humphrey, Gregory Humphrey   Account Number:  [id number]  Date Initiated:  10/08/2011  Documentation initiated by:  [facility]  Subjective/Objective Assessment:   large acute subdural, subarachnoid and parenchimal hemorrhage     Action/Plan:   Anticipated DC Date:  10/24/2011   Anticipated DC Plan:  SKILLED NURSING FACILITY  In-house referral  Clinical Social Worker      DC Planning Services  CM consult      Choice offered to / List presented to:             Status of service:  In process, will continue to follow Medicare Important Message given?   (If response is "NO", the following Medicare IM given date fields will be blank) Date Medicare IM given:   Date Additional Medicare IM given:    Discharge Disposition:    Per UR Regulation:  Reviewed for med. necessity/level of care/duration of stay  Comments:  10/20/11 Avi Archuleta,RN,BSN 1445 PT AND OT RECOMMENDING SNF AT DISCHARGE.  WILL CONSULT CSW TO FACILITATE POSSIBLE SNF PLACEMENT FOR REHAB WHEN MEDICALLY STABLE. Phone #[phone]  10-20-11 2pm Avie Arenas, RNBSN - [phone] UR compelted.  10/16/11 Julya Alioto,RN,BSN ASKED TO EVALUATE PT FOR LTAC...PT'S INSURANCE DOES NOT RECOGNIZE LTAC AND WILL NOT APPROVE FOR DISPOSITION.  WILL HAVE TO EXPLORE VENT SNF VS REGULAR SNF ONCE WEANED.  WILL NOTIFY CSW OF THIS.  10-05-11 11:20am Avie Arenas, RNBSN [phone] UR Completed.

## 2011-10-20 NOTE — Progress Notes (Signed)
Patient ID: Gregory Humphrey, male   DOB: 1940-12-19, 70 y.o.   MRN: [id number] Subjective: Patient reports unchanged and remains unresponsive  Objective: Vital signs in last 24 hours: Temp:  [97.5 F (36.4 C)-101.6 F (38.7 C)] 99.1 F (37.3 C) (12/10 0600) Pulse Rate:  [80-93] 92  (12/10 0600) Resp:  [18-28] 18  (12/10 0600) BP: (113-219)/(64-83) 153/83 mmHg (12/10 0600) SpO2:  [94 %-96 %] 94 % (12/10 0600) FiO2 (%):  [28 %] 28 % (12/10 0600) Weight:  [92.4 kg (203 lb 11.3 oz)] 203 lb 11.3 oz (92.4 kg) (12/10 0600)  Intake/Output from previous day: 12/09 0701 - 12/10 0700 In: -  Out: 1500 [Urine:1500] Intake/Output this shift:    Physical Exam: Unchanged  Lab Results:  [facility] 10/20/11 0545  WBC 8.8  HGB 11.2*  HCT 34.6*  PLT 256   BMET  Basename 10/20/11 0545  NA 143  K 3.9  CL 108  CO2 26  GLUCOSE 182*  BUN 21  CREATININE 0.73  CALCIUM 8.8    Studies/Results: No results found.  Assessment/Plan: Continue PT and will require placement    LOS: 13 days    Fletcher Ostermiller D, MD 10/20/2011, 8:07 AM

## 2011-10-21 ENCOUNTER — Inpatient Hospital Stay (HOSPITAL_COMMUNITY): Payer: Medicare Other

## 2011-10-21 LAB — GLUCOSE, CAPILLARY: Glucose-Capillary: 155 mg/dL — ABNORMAL HIGH (ref 70–99)

## 2011-10-21 NOTE — Progress Notes (Signed)
Subjective: Patient reports (aphasic)  Objective: Vital signs in last 24 hours: Temp:  [98.5 F (36.9 C)-101.6 F (38.7 C)] 99.4 F (37.4 C) (12/11 1010) Pulse Rate:  [83-96] 86  (12/11 1010) Resp:  [18-26] 20  (12/11 1010) BP: (127-151)/(74-96) 130/74 mmHg (12/11 1010) SpO2:  [92 %-95 %] 94 % (12/11 1010) FiO2 (%):  [28 %] 28 % (12/11 1010)  Intake/Output from previous day: 12/10 0701 - 12/11 0700 In: -  Out: 1425 [Urine:1425] Intake/Output this shift:    Trach collar. Grimaces with pupil check. Without change neurologically.  Lab Results:  [facility] 10/20/11 0545  WBC 8.8  HGB 11.2*  HCT 34.6*  PLT 256   BMET  Basename 10/20/11 0545  NA 143  K 3.9  CL 108  CO2 26  GLUCOSE 182*  BUN 21  CREATININE 0.73  CALCIUM 8.8    Studies/Results: Ct Head Wo Contrast  10/20/2011  *RADIOLOGY REPORT*  Clinical Data: 70 year old male status post surgery for left subdural hematoma.  CT HEAD WITHOUT CONTRAST  Technique:  Contiguous axial images were obtained from the base of the skull through the vertex without contrast.  Comparison: 10/13/2011 and earlier.  Findings: Sequelae of left frontotemporal craniotomy with overlying skin staples, decreasing scalp swelling, and decreasing subcutaneous gas.  There is a partially calcified lesion of the left temporalis muscle, favor post traumatic (series 2 image 19) and this overlies the anterior craniotomy margin.  Mixed density left subdural collection persists with decreased associated pneumocephalus.  The subdural measures up to 13 mm in thickness, not significantly changed.  Rightward midline shift of 4 mm is also stable.  Multi focal subcortical and basal ganglia hypodensity is stable.  Dolichoectasia of the left vertebral artery, basilar artery, and ICA is re-identified.  No intraventricular or new intracranial hemorrhage. No evidence of cortically based acute infarction identified.  Stable gray-white matter differentiation throughout the  brain.  The patient is no longer intubated.  Paranasal sinus pneumatization is improving. Visualized orbit soft tissues are within normal limits.  Small fluid levels in the mastoid air cells are new.  IMPRESSION: 1.  No significant change in the mixed density left subdural hematoma measuring up to 13 mm in thickness and associated with 4 mm of rightward midline shift. 2.  No new intracranial abnormality. 3.  Post traumatic/postoperative changes to the left scalp.  Original Report Authenticated By: Harley Hallmark, M.D.    Assessment/Plan: No evidence of distress. Weak grimace with pupil check; otherwise without change neurologically.  LOS: 14 days  Continue support.   Georgiann Cocker 10/21/2011, 11:58 AM

## 2011-10-21 NOTE — Progress Notes (Signed)
*  PRELIMINARY RESULTS* EEG has been performed.  Gregory Humphrey 10/21/2011, 10:09 AM

## 2011-10-21 NOTE — Progress Notes (Addendum)
CSW met with pt wife this morning, provided support and discussed discharge planning and skilled nursing. Pt wife provided Advance Directive (copy placed in chart). Pt wife overwhelmed with uncertainty and information coming to her in bits and pieces from multiple professionals, and receptive to suggestion for a family meeting. CSW feels this would be appropriate prior to d/c planning, as pt advanced directive precludes long term "life support," and pt wife needs clarity as to pt prognosis in decision making.  CSW contacted Dr Sherene Sires who declines participation in family meeting and requests Neurosurgery facilitate this discussion. CSW has call in to [facility] Neuro and will coordinate meeting.  Baxter Flattery, MSW [phone]   CSW spoke with Annie Main, NP and arranged informal family meeting for tomorrow. Time to be advised. Pt wife requesting to bring 1 or 2 other family members to participate in discussion. CSW will continue to follow.

## 2011-10-21 NOTE — Progress Notes (Signed)
Name: Gregory Humphrey MRN: [id number] DOB: [birth year]  DOS: 10/07/2011    LOS: 14  CRITICAL CARE PROGRESS NOTE  History of Present Illness: 70 y/o man with h/o HTN brought to [facility] ED 11/27 unresponsive after complaining of headache >  Intubated for airway protection.  Head CT demonstrated large acute subdural, subarachnoid and parenchymal hemorrhage. OR crani 11/28.  Lines / Drains: 11/27  ETT>>> 12/5  A line rt rad 11/28 >>>12/2 12/2 RUE PICC >>  12/5 #8 trach (DF) >> PEG (Pyrtle) 12/06>>  Cultures / Sepsis markers: BC x 2  11/28 > neg Sputum 11/28 >>> strep pneumo (PCN sensitive) Trach sputum 12/11 >>>  Antibiotics: (concern CAP  Vs asp) Ceftriaxone 11/28 >>> 12/5 levoflox 11/28 >>12/2  Tests / Events: 11/27  Found unresponsive.  Head CT demonstrated large acute subdural, subarachnoid and parenchimal hemorrhage. 11/28- OR drain SDH 11/28- CT angio, no aneursym 11/28  Craniotomy for spontaneous SDH evacuation  Subjective:  Wife reports increased secretions, tmax overnight 101.6   Vital Signs:  BP 130/74  Pulse 86  Temp(Src) 99.4 F (37.4 C) (Axillary)  Resp 18  Ht 5\' 10"  (1.778 m)  Wt 203 lb 11.3 oz (92.4 kg)  BMI 29.23 kg/m2  SpO2 95% I/O last 3 completed shifts: In: -  Out: 2925 [Urine:2925]    Physical Examination: Gen:   no acute distress/ no response to verbal HEENT: mm pink/moist, trach midline PULM: minimal bilateral rhonchi CV: RRR, no mgr, no JVD AB: BS+, soft, nontender, peg in place c/d/i Ext: warm, pitting edema, no clubbing, no cyanosis Derm: no rash or skin breakdown Neuro: somnolent with spontaneous movements all over, no response to verbal  Labs:  Lab 10/20/11 0545 10/17/11 0411 10/16/11 0340  NA 143 142 138  K 3.9 3.7 3.7  CL 108 110 106  CO2 26 22 20   BUN 21 28* 27*  CREATININE 0.73 0.78 0.84  GLUCOSE 182* 169* 135*    Lab 10/20/11 0545 10/17/11 0411 10/16/11 0340  HGB 11.2* 12.4* 12.8*  HCT 34.6* 36.6* 37.9*  WBC 8.8 11.7*  9.6  PLT 256 242 235      RADIOLOGY: Ct Head Wo Contrast  10/20/2011  .  IMPRESSION: 1.  No significant change in the mixed density left subdural hematoma measuring up to 13 mm in thickness and associated with 4 mm of rightward midline shift. 2.  No new intracranial abnormality. 3.  Post traumatic/postoperative changes to the left scalp.  Original Report Authenticated By: Harley Hallmark, M.D.    Assessment and Plan: 1) Large acute subdural hemorrhage with herniation. Now s/p crani, drainage. > needs placement per neuro/ trach and peg dep  2) Seizures risk -- keppra per neurology  3) Acute ventilator dependent respiratory failure due to neuro status -- T collar dep only  4) Questionable PNA - pneumococcus in sputum.  12/11 with increased sputum production, fever overnight.  --completed ceftriaxone 12/5 --check cxr in am -- may need abx, monitor fever curve --encourage turn Q2 and PRN suctioning  5) HTN emergency: BP OK off nicardipine  6) hyperkalemia, resolved  7) Nutrition  -- [facility], s/p PEG placement on 12/6 -- tolerating tube feeds  8) IV access - PICC successfully repositioned 12/5  9) Hyperglycemia -- SSI  Best practices / Disposition:  -->DNR, aggressive care otherwise -->SCDs for DVT Px -->Protonix for GI Px -->TF - PEG 12/6 -->family updated at bedside  Discussed SNF placement with wife.  SW has faxed information out to facilities,  will await acceptance.     Canary Brim, NP-C [facility] Pgr: [phone]   I personally met with wife and son and informed them that his neuro prognosis was the limiting factor but if he did not improve, just like [name], regardless of what setting (ICU vs Home ICU on SNF) he would ultimately succumb to nosocomial complications and in this setting Clearly the high likelihood of prolonging suffering from pulmonary/ccm escalation of care  vastly outweighs any reasonable chance of benefit from  offering anything else and he remains dnr status - whether to continue all of his support at present level vs withdraw care according to his advanced directive is entirely based on his neuro prognosis.   Therefore  I don't have any additional recs  except to have fm discuss neuro prognosis with  his neurologist / neurosurgeon.   Discussed with SW Cora also  Sandrea Hughs, MD Pulmonary and Critical Care Medicine [facility] Cell [phone]

## 2011-10-21 NOTE — Progress Notes (Signed)
Stroke Team Progress Note  SUBJECTIVE [name] is a 70 y.o. male whose wife feels his symptoms of are unchanged. She returned to work yesterday afternoon. She is awaiting her son and his wife today. Wife and Dr. Pearlean Brownie discussed long-term prognosis possibilities.  OBJECTIVE Most recent Vital Signs: Temp: 99.4 F (37.4 C) (12/11 1010) Temp src: Axillary (12/11 1010) BP: 130/74 mmHg (12/11 1010) Pulse Rate: 86  (12/11 1010) Respiratory Rate: 18 O2 Saturdation: 95%  CBG (last 3)   Basename 10/21/11 1121 10/21/11 0835 10/21/11 0429  GLUCAP 155* 153* 166*    Diet    tube feedings via PEG  Activity  Up with assistance  VTE Prophylaxis  SCDs   Studies:  Ct Head Wo Contrast 10/20/2011   1.  No significant change in the mixed density left subdural hematoma measuring up to 13 mm in thickness and associated with 4 mm of rightward midline shift. 2.  No new intracranial abnormality. 3.  Post traumatic/postoperative changes to the left scalp.    EEG completed results pending  Physical Exam  The patient is lethargic, difficult to arouse. Positive doll's eyes are noted. Pupils are equal, round, reactive.  The patient will move the upper extremity spontaneously Right more than left. Movement of the lower extremities is not seen.  Deep tendon reflexes are depressed but symmetric.  The patient has minimal response to deep pain stimulation.  Patient will not follow commands, and cerebellar testing cannot be performed.   ASSESSMENT [name] is a 70 y.o. male with a large left SDH and 1.4 cm left to right shift with subfalcine and uncal herniation and cerebral edema post evacuation by Dr. Venetia Maxon. Hemorrhage secondary to ruptured smal left pareital cortical small AVM from coughing. No improvement with lethargy and unresponsiveness. CT and revealing for possible etiology of her lethargy. EEG completed results pending.  Hyperglycemia secondary to tube feedings   Stroke risk  factors: family history and hypertension   Patient is medically ready for discharge to skilled nursing facility.  Hospital day # 14  TREATMENT/PLAN Follow up EEG. SNF placement. Bed search underway. Prognosis remains guarded.  [name], ANP-BC, GNP-BC [facility] Pager: [phone] 10/21/2011 1:04 PM  Dr. Delia Heady, Stroke Center Medical Director, has personally reviewed chart, pertinent data, examined the patient and developed the plan of care.

## 2011-10-22 ENCOUNTER — Inpatient Hospital Stay (HOSPITAL_COMMUNITY): Payer: Medicare Other

## 2011-10-22 ENCOUNTER — Telehealth: Payer: Self-pay | Admitting: Family Medicine

## 2011-10-22 LAB — GLUCOSE, CAPILLARY: Glucose-Capillary: 166 mg/dL — ABNORMAL HIGH (ref 70–99)

## 2011-10-22 LAB — BASIC METABOLIC PANEL
Calcium: 9.3 mg/dL (ref 8.4–10.5)
GFR calc Af Amer: >90 mL/min (ref >90)
GFR calc non Af Amer: 85 mL/min — ABNORMAL LOW (ref >90)
Glucose, Bld: 193 mg/dL — ABNORMAL HIGH (ref 70–99)
Sodium: 149 mEq/L — ABNORMAL HIGH (ref 135–145)

## 2011-10-22 LAB — CBC
MCH: 31.3 pg (ref 26.0–34.0)
Platelets: 280 10*3/uL (ref 150–400)
RBC: 3.9 MIL/uL — ABNORMAL LOW (ref 4.22–5.81)
WBC: 8.9 10*3/uL (ref 4.0–10.5)

## 2011-10-22 MED ORDER — VANCOMYCIN HCL 1000 MG IV SOLR
1250.0000 mg | Freq: Two times a day (BID) | INTRAVENOUS | Status: DC
  Administered 2011-10-22 – 2011-10-24 (×4): 1250 mg via INTRAVENOUS
  Filled 2011-10-22 (×5): qty 1250

## 2011-10-22 MED ORDER — FREE WATER
200.0000 mL | Freq: Three times a day (TID) | Status: DC
  Administered 2011-10-22 – 2011-10-23 (×3): 200 mL
  Filled 2011-10-22 (×6): qty 200

## 2011-10-22 MED ORDER — DEXTROSE 5 % IV SOLN
1.0000 g | INTRAVENOUS | Status: DC
  Administered 2011-10-22 – 2011-10-23 (×2): 1 g via INTRAVENOUS
  Filled 2011-10-22 (×3): qty 10

## 2011-10-22 NOTE — Procedures (Signed)
EEG ID:  [id number].  HISTORY:  A 70 year old man, status post left craniotomy unresponsive per report.  MEDICATIONS:  Keppra per report.  CONDITIONS OF RECORDING:  This 16-lead EEG was recorded with the patient in awake, drowsy, and sleep states.  Background rhythm:  Background patterns in wakefulness were well-organized with a well-sustained posterior dominant rhythm of 5-7 Hz, symmetrical and reactive to eye opening and closing.  Drowsiness was associated with mild attenuation of voltage and slowing frequencies.  Normal sleep patterns were seen. Abnormal potentials:  Frequent left fronto-temporal slowing was noted. No epileptiform activity was noted.  ACTIVATION PROCEDURES:  Hyperventilation was not performed.  Photic stimulation did not activate tracing.  EKG:  Single-channel of EKG monitoring was unremarkable.  IMPRESSION:  This is an abnormal awake, drowsy, and sleep EEG due to  presence of frequent left fronto-emporal slowing.  The finding may be suggestive of an underlying structural abnormality of any etiology in that head region.  Mild-to-moderate diffuse background slowing, which may Be suggestive of mild-to-moderate diffuse cerebral dysfunction and may be due to toxic metabolic encephalopathy, neurodegenerative disorder, and/or Bi-hemispheric structural abnormality.  Clinical correlation is suggested.          ______________________________ Carmell Austria, MD    DG:UYQI D:  10/21/2011 13:03:02  T:  10/21/2011 23:06:46  Job #:  [id number]

## 2011-10-22 NOTE — Telephone Encounter (Signed)
Patient's wife called to update you on his condition.  They inserted a feeding tube and put him in a regular room.  He still hasn't woken up.  They are looking for a place for him to go for extended care w/trach.care. Fleet Contras said you can get a daily update on patient on [email or web address].

## 2011-10-22 NOTE — Telephone Encounter (Signed)
Spoke with wife. Things look dire but he is stable. No response above what has been going on. Will be moved to long term care.

## 2011-10-22 NOTE — Progress Notes (Signed)
Name: Gregory Humphrey MRN: [id number] DOB: [birth year]  DOS: 10/07/2011    LOS: 15  F/u post ICU progress note  History of Present Illness: 70 y/o man with h/o HTN brought to [facility] ED 11/27 unresponsive after complaining of headache >  Intubated for airway protection.  Head CT demonstrated large acute subdural, subarachnoid and parenchymal hemorrhage. OR crani 11/28.  Lines / Drains: 11/27  ETT>>> 12/5  A line rt rad 11/28 >>>12/2 12/2 RUE PICC >>  12/5 #8 trach (DF) >> PEG (Pyrtle) 12/06>>  Cultures / Sepsis markers: BC x 2  11/28 > neg Sputum 11/28 >>> strep pneumo (PCN sensitive) Trach sputum 12/11 > wbc gpc >>  Antibiotics: (concern CAP  Vs asp) Ceftriaxone 11/28 >>> 12/5 levoflox 11/28 >>12/2  Tests / Events: 11/27  Found unresponsive.  Head CT demonstrated large acute subdural, subarachnoid and parenchimal hemorrhage. 11/28- OR drain SDH 11/28- CT angio, no aneursym 11/28  Craniotomy for spontaneous SDH evacuation  Subjective:   No changes over night, remains unresponsive to verbal   Vital Signs:  BP 129/74  Pulse 94  Temp(Src) 101.7 F (38.7 C) (Axillary)  Resp 20  Ht 5\' 10"  (1.778 m)  Wt 203 lb 11.3 oz (92.4 kg)  BMI 29.23 kg/m2  SpO2 97% I/O last 3 completed shifts: In: -  Out: 2675 [Urine:2675] Total I/O In: -  Out: 600 [Urine:600]  Physical Examination: Gen:   no acute distress/ no response to verbal HEENT: mm pink/moist, trach midline PULM: minimal bilateral rhonchi CV: RRR, no mgr, no JVD AB: BS+, soft, nontender, peg in place c/d/i Ext: warm, pitting edema, no clubbing, no cyanosis Derm: no rash or skin breakdown Neuro: somnolent with spontaneous movements all over, no response to verbal  Labs:  Lab 10/22/11 0540 10/20/11 0545 10/17/11 0411  NA 149* 143 142  K 4.3 3.9 3.7  CL 113* 108 110  CO2 26 26 22   BUN 28* 21 28*  CREATININE 0.89 0.73 0.78  GLUCOSE 193* 182* 169*    Lab 10/22/11 0540 10/20/11 0545 10/17/11 0411  HGB 12.2*  11.2* 12.4*  HCT 37.1* 34.6* 36.6*  WBC 8.9 8.8 11.7*  PLT 280 256 242      RADIOLOGY: cxr 12/12   No infiltrates   Assessment and Plan: 1) Large acute subdural hemorrhage with herniation. Now s/p crani, drainage. > needs placement per neuro/ trach and peg dep  2) Seizures risk -- keppra per neurology  3) Acute ventilator dependent respiratory failure due to neuro status -- T collar dep only  4) Questionable PNA - pneumococcus in sputum.  12/11 with increased sputum production, fever overnight.  --completed ceftriaxone 12/5 --check cxr in am -- may need abx, monitor fever curve --encourage turn Q2 and PRN suctioning  5) HTN emergency: BP OK off nicardipine  6) hyperkalemia, resolved  7) Nutrition  -- [facility], s/p PEG placement on 12/6 -- tolerating tube feeds  8) IV access - PICC successfully repositioned 12/5  9) Hyperglycemia -- SSI  10) Hypernatremia Lab Results  Component Value Date   NA 149* 10/22/2011   NA 143 10/20/2011   NA 142 10/17/2011   Needs increased free water esp since low grade fever   Best practices / Disposition:  -->DNR, aggressive care otherwise -->SCDs for DVT Px -->Protonix for GI Px -->TF - PEG 12/6 -->family updated at bedside  Discussed SNF placement with wife.  SW has faxed information out to facilities, will await acceptance.     Canary Brim,  NP-C [facility] Pgr: [phone]   I personally met with wife and sister to review recs by neurology   He remains NCB, no escalation of care, but fm not ready to consider w/d p discussion with Dr Pearlean Brownie re uncertain prognosis  Discussed with SW Cora also  Sandrea Hughs, MD Pulmonary and Critical Care Medicine [facility] Cell [phone]

## 2011-10-22 NOTE — Progress Notes (Signed)
CSW met with pt wife and sister and Dr Pearlean Brownie at bedside to discuss pt disposition and prognosis. Pt family very appreciative of family meeting, stating they have a clearer understanding of pt medical condition and likelihood of recovery. Pt family not ready to withdraw care at this time, though they understand pt unlikely to resume a quality of life that he would have wanted. Pt family requesting SNF placement at this time, with plan to reevaluate pt condition after the holidays and possibly move to comfort care.  Pt family appropriately tearful, and very appreciative of CSW support.  CSW has faxed pt out to [facility] and [facility] facilities, awaiting bed offers and [employer] authorization for SNF.  CSW will continue to follow.   Baxter Flattery, MSW [phone]

## 2011-10-22 NOTE — Progress Notes (Signed)
PCCM Crosscover Note:  Called by bedside RN re: results of sputum GS.  He has GPC pairs and clusters on GS. RN describes malodorous thick purulent green sputum and fever to 101.0 farenheit. CXR shows bibasilar infiltrates and atelectasis.  RN also raises concern re: PICC tip location. CXR reveals RUE PICC that turns upward then inferiorly with tip in innominate vein  IMP: Purulent resp secretions in setting of fever with sputum GS positive for GPCs  Plan:  Vanc and ceftriaxone ordered  OK to use PICC in its current position for abx  Billy Fischer, MD;  PCCM service; Mobile [phone]

## 2011-10-22 NOTE — Progress Notes (Signed)
Stroke Team Progress Note  SUBJECTIVE [name] is a 70 y.o. male who feels their symptoms of are unchanged. Wife, sister and SW are at the  Bedside. Long discussion related to pts diagnosis, hospital care and long-term plan. Sister and wife understand long-term prognosis of disability, uncertainty of neuro progression.  OBJECTIVE Most recent Vital Signs: Temp: 101.7 F (38.7 C) (12/12 0944) Temp src: Axillary (12/12 0944) BP: 129/74 mmHg (12/12 0944) Pulse Rate: 99  (12/12 0944) Respiratory Rate: 20 O2 Saturdation: 97%  CBG (last 3)   Basename 10/22/11 0824 10/22/11 0416 10/22/11 0015  GLUCAP 181* 151* 166*    Diet    tube feedings  Activity  Up with assistance  VTE Prophylaxis  SCDs   Studies: Results for orders placed during the hospital encounter of 10/07/11 (from the past 24 hour(s))  CULTURE, RESPIRATORY     Status: Normal (Preliminary result)   Collection Time   10/21/11  5:07 PM      Component Value Range   Specimen Description TRACHEAL ASPIRATE     Special Requests NONE     Gram Stain       Value: ABUNDANT WBC PRESENT,BOTH PMN AND MONONUCLEAR     FEW SQUAMOUS EPITHELIAL CELLS PRESENT     ABUNDANT GRAM POSITIVE COCCI     IN PAIRS IN CLUSTERS   Culture NO GROWTH     Report Status PENDING    BASIC METABOLIC PANEL     Status: Abnormal   Collection Time   10/22/11  5:40 AM      Component Value Range   Sodium 149 (*) 135 - 145 (mEq/L)   Potassium 4.3  3.5 - 5.1 (mEq/L)   Chloride 113 (*) 96 - 112 (mEq/L)   CO2 26  19 - 32 (mEq/L)   Glucose, Bld 193 (*) 70 - 99 (mg/dL)   BUN 28 (*) 6 - 23 (mg/dL)   Creatinine, Ser 8.11  0.50 - 1.35 (mg/dL)   Calcium 9.3  8.4 - 91.4 (mg/dL)   GFR calc non Af Amer 85 (*) >90 (mL/min)   GFR calc Af Amer >90  >90 (mL/min)  CBC     Status: Abnormal   Collection Time   10/22/11  5:40 AM      Component Value Range   WBC 8.9  4.0 - 10.5 (K/uL)   RBC 3.90 (*) 4.22 - 5.81 (MIL/uL)   Hemoglobin 12.2 (*) 13.0 - 17.0  (g/dL)   HCT 78.2 (*) 95.6 - 52.0 (%)   MCV 95.1  78.0 - 100.0 (fL)   MCH 31.3  26.0 - 34.0 (pg)   MCHC 32.9  30.0 - 36.0 (g/dL)   RDW 21.3  08.6 - 57.8 (%)   Platelets 280  150 - 400 (K/uL)    Dg Chest Port 1 View 10/22/2011 Right PICC line has been retracted or replaced and is an atypical position curled over the right lung apex.   This needs to be repositioned.   EEG - left sided slowing. No seizure.  Physical Exam  The patient is lethargic, difficult to arouse. Positive doll's eyes are noted. Pupils are equal, round, reactive.  The patient will move the upper extremity spontaneously Right more than left. Movement of the lower extremities is not seen.  Deep tendon reflexes are depressed but symmetric.  The patient has minimal response to deep pain stimulation.  Patient will not follow commands, and cerebellar testing cannot be performed.  ASSESSMENT [name] is a  70 y.o. male with a large left SDH and 1.4 cm left to right shift with subfalcine and uncal herniation and cerebral edema post evacuation by Dr. Venetia Maxon. Hemorrhage secondary to ruptured smal left pareital cortical small AVM from coughing. No improvement with lethargy and unresponsiveness.   Hyperglycemia secondary to tube feedings   Stroke risk factors: family history and hypertension   Patient is medically ready for discharge to skilled nursing facility.  Wife may consider comfort care after the holidays.  Hospital day # 15  TREATMENT/PLAN SNF placement. Bed search underway. Add AFO for feet.15 minute discussion with wife and Child psychotherapist about disposition.Marland Kitchen  [name], ANP-BC, GNP-BC [facility] Pager: [phone] 10/22/2011 11:44 AM  Dr. Delia Heady, Stroke Center Medical Director, has personally reviewed chart, pertinent data, examined the patient and developed the plan of care.

## 2011-10-22 NOTE — Progress Notes (Signed)
Discussed in the long length of stay meeting Breia Ocampo Weeks 10/22/2011  

## 2011-10-22 NOTE — Progress Notes (Addendum)
Physical Therapy Treatment Patient Details Name: Gregory Humphrey MRN: [id number] DOB: 02-Jul-1941 Today's Date: 10/22/2011  PT Assessment/Plan  PT - Assessment/Plan Comments on Treatment Session: Maintained eye opening at EOB.  Difficulty following commands and arousing for session. PT Plan: Discharge plan remains appropriate;Frequency remains appropriate PT Frequency: Min 3X/week Follow Up Recommendations: Skilled nursing facility Equipment Recommended: Defer to next venue PT Goals  Acute Rehab PT Goals PT Goal Formulation: With family Time For Goal Achievement: 2 weeks Pt will go Supine/Side to Sit: with max assist PT Goal: Supine/Side to Sit - Progress: Progressing toward goal Pt will go Sit to Supine/Side: with max assist PT Goal: Sit to Supine/Side - Progress: Progressing toward goal Pt will go Sit to Stand: with max assist PT Goal: Sit to Stand - Progress: Progressing toward goal Pt will go Stand to Sit: with max assist PT Goal: Stand to Sit - Progress: Progressing toward goal Pt will Transfer Bed to Chair/Chair to Bed: with max assist PT Transfer Goal: Bed to Chair/Chair to Bed - Progress: Progressing toward goal  PT Treatment Precautions/Restrictions  Precautions Precautions: Fall Required Braces or Orthoses: No Restrictions Weight Bearing Restrictions: No Pain No pain evident throughout all of treatment. Mobility (including Balance) Bed Mobility Bed Mobility: Yes Supine to Sit: 1: +1 Total assist;HOB elevated (Comment degrees) (HOB 60 degrees.) Supine to Sit Details (indicate cue type and reason): Assist to trunk to translate anterior and for bilateral LEs to facilitate off EOB. Sitting - Scoot to Edge of Bed: 1: +1 Total assist Sitting - Scoot to Edge of Bed Details (indicate cue type and reason): Assist to reciprocally scoot bilateral hips with pad and leaning left<->right. Sit to Supine - Left: 1: +1 Total assist;HOB flat Sit to Supine - Left Details (indicate  cue type and reason): Assist to trunk to slow descent to bed with facilitation to bilateral LEs.  Cues for sequence. Transfers Transfers: No Ambulation/Gait Ambulation/Gait: No Stairs: No Wheelchair Mobility Wheelchair Mobility: No  Posture/Postural Control Posture/Postural Control: Postural limitations Postural Limitations: Decreased trunk strength causing trunk to flex collapsing forward and moving pelvis into a posterior pelvic tilt. Balance Balance Assessed: Yes Static Sitting Balance Static Sitting - Balance Support: No upper extremity supported;Feet supported Static Sitting - Level of Assistance: 1: +1 Total assist Static Sitting - Comment/# of Minutes: Assist to attempt to extend trunk and neck to neutral.  Pt able to open eyes, but with difficulty increasing participation in therapy. End of Session PT - End of Session Activity Tolerance: Patient tolerated treatment well Patient left: in bed;with call bell in reach;with family/visitor present Nurse Communication: Need for lift equipment General Behavior During Session: Lethargic Cognition: Impaired  Spoke to Dr Pearlean Brownie and Annie Main, NP about ordering bilateral LE PRAFOs secondary to foot drop.  Myria Steenbergen M 10/22/2011, 1:02 PM  10/22/2011 Cephus Shelling, PT, DPT [phone]

## 2011-10-22 NOTE — Progress Notes (Signed)
Nutrition Follow-up  PEG placed 12/6. Tolerating wean with trach collar.  Diet Order:  NPO  TF: Promote at 85 ml/hr with 200 ml free water TID, providing 2040 kcal, 127 g protein, 2312 ml free water. Patient is tolerating well.   Meds: Scheduled Meds:   . antiseptic oral rinse  1 application Mouth Rinse QID  . chlorhexidine  15 mL Mouth/Throat BID  . sennosides  5 mL Per Tube BID   And  . docusate  50 mg Per Tube BID  . free water  200 mL Per Tube Q8H  . insulin aspart  0-9 Units Subcutaneous Q4H  . insulin aspart  3 Units Subcutaneous Q4H  . levETIRAcetam  750 mg Oral Q12H  . pantoprazole sodium  40 mg Per Tube Q24H  . DISCONTD: sodium chloride  10 mL Intracatheter Q12H   Continuous Infusions:   . feeding supplement (PROMOTE) 1,000 mL (10/21/11 2153)  . DISCONTD: sodium chloride 20 mL/hr at 10/20/11 2027   PRN Meds:.acetaminophen, acetaminophen, fentaNYL, ondansetron (ZOFRAN) IV, sodium chloride  Labs:  CMP     Component Value Date/Time   NA 149* 10/22/2011 0540   K 4.3 10/22/2011 0540   CL 113* 10/22/2011 0540   CO2 26 10/22/2011 0540   GLUCOSE 193* 10/22/2011 0540   BUN 28* 10/22/2011 0540   CREATININE 0.89 10/22/2011 0540   CALCIUM 9.3 10/22/2011 0540   PROT 6.0 10/10/2011 0415   ALBUMIN 2.5* 10/10/2011 0415   AST 27 10/10/2011 0415   ALT 23 10/10/2011 0415   ALKPHOS 49 10/10/2011 0415   BILITOT 1.0 10/10/2011 0415   GFRNONAA 85* 10/22/2011 0540   GFRAA >90 10/22/2011 0540     Intake/Output Summary (Last 24 hours) at 10/22/11 1350 Last data filed at 10/22/11 1115  Gross per 24 hour  Intake      0 ml  Output   2625 ml  Net  -2625 ml    Weight Status:  92.4 kg, up from 89.3 kg 12/5  Nutrition Dx:  Inadequate oral intake, ongoing  Goal:  EN to meet >90% of estimated nutrition needs, meeting  Intervention:  Continue current enteral nutrition regimen.   Monitor:  EN tolerance, weight, labs   Fabio Pierce Pager #:  [phone]

## 2011-10-22 NOTE — Progress Notes (Signed)
Subjective: Patient reports (aphasic)  Objective: Vital signs in last 24 hours: Temp:  [98.3 F (36.8 C)-101.7 F (38.7 C)] 101.7 F (38.7 C) (12/12 0944) Pulse Rate:  [86-105] 99  (12/12 0944) Resp:  [18-28] 20  (12/12 0944) BP: (125-148)/(74-84) 129/74 mmHg (12/12 0944) SpO2:  [90 %-100 %] 97 % (12/12 0944) FiO2 (%):  [28 %] 28 % (12/12 0944) Weight:  [92.4 kg (203 lb 11.3 oz)] 203 lb 11.3 oz (92.4 kg) (12/12 0600)  Intake/Output from previous day: 12/11 0701 - 12/12 0700 In: -  Out: 2025 [Urine:2025] Intake/Output this shift:    Weak grimace when pupils checked, otherwise only random motion UE's reported.   Lab Results:  [facility] 10/22/11 0540 10/20/11 0545  WBC 8.9 8.8  HGB 12.2* 11.2*  HCT 37.1* 34.6*  PLT 280 256   BMET  Basename 10/22/11 0540 10/20/11 0545  NA 149* 143  K 4.3 3.9  CL 113* 108  CO2 26 26  GLUCOSE 193* 182*  BUN 28* 21  CREATININE 0.89 0.73  CALCIUM 9.3 8.8    Studies/Results: Ct Head Wo Contrast  10/20/2011  *RADIOLOGY REPORT*  Clinical Data: 70 year old male status post surgery for left subdural hematoma.  CT HEAD WITHOUT CONTRAST  Technique:  Contiguous axial images were obtained from the base of the skull through the vertex without contrast.  Comparison: 10/13/2011 and earlier.  Findings: Sequelae of left frontotemporal craniotomy with overlying skin staples, decreasing scalp swelling, and decreasing subcutaneous gas.  There is a partially calcified lesion of the left temporalis muscle, favor post traumatic (series 2 image 19) and this overlies the anterior craniotomy margin.  Mixed density left subdural collection persists with decreased associated pneumocephalus.  The subdural measures up to 13 mm in thickness, not significantly changed.  Rightward midline shift of 4 mm is also stable.  Multi focal subcortical and basal ganglia hypodensity is stable.  Dolichoectasia of the left vertebral artery, basilar artery, and ICA is re-identified.  No  intraventricular or new intracranial hemorrhage. No evidence of cortically based acute infarction identified.  Stable gray-white matter differentiation throughout the brain.  The patient is no longer intubated.  Paranasal sinus pneumatization is improving. Visualized orbit soft tissues are within normal limits.  Small fluid levels in the mastoid air cells are new.  IMPRESSION: 1.  No significant change in the mixed density left subdural hematoma measuring up to 13 mm in thickness and associated with 4 mm of rightward midline shift. 2.  No new intracranial abnormality. 3.  Post traumatic/postoperative changes to the left scalp.  Original Report Authenticated By: Harley Hallmark, M.D.   Dg Chest Port 1 View  10/22/2011  *RADIOLOGY REPORT*  Clinical Data: ET tube evaluation.  PORTABLE CHEST - 1 VIEW  Comparison: 10/16/2011.  Findings: Tracheostomy tube tip midline.  Right PICC line has been retracted or replaced and is an atypical position curled over the right lung apex.  This needs to be repositioned.  No gross pneumothorax.  Poor inspiration with basilar atelectasis.  Limited for detection of underlying basilar mass or infiltrate.  Calcified tortuous aorta.  Heart size within normal limits. Central pulmonary vascular prominence.  IMPRESSION: Right PICC line has been retracted or replaced and is an atypical position curled over the right lung apex.  This needs to be repositioned.  Original Report Authenticated By: Fuller Canada, M.D.    Assessment/Plan: Without change neurologically. Staples removed without difficulty from scalp incision. No evidence of discomfort (HR change or grimace). Incision well-approximated.  LOS: 15 days  Continue support. OK to shampoo hair/scalp. Encouraged comfort care. Briefly discussed status with wife: no change neurologically after she reported one episode of right forearm tremor yesterday, likely secondary to weakness, unlikely seizure activity. Wife continues to struggle  with future plans for care, leaning toward SNF & curious as to medical followup at that point.   Georgiann Cocker 10/22/2011, 10:33 AM

## 2011-10-22 NOTE — Progress Notes (Signed)
PHARMACY - ANTIBIOTIC CONSULT  INITIAL NOTE  Pharmacy Consult for: Vancomycin  Indication: rule out pneumonia   Patient Data:   Allergies: No Known Allergies  Patient Measurements: Height: 5\' 10"  (177.8 cm) Weight: 203 lb 11.3 oz (92.4 kg) IBW/kg (Calculated) : 73  Adjusted Body Weight:   Vital Signs: Temp:  [98.3 F (36.8 C)-101.7 F (38.7 C)] 101.7 F (38.7 C) (12/12 0944) Pulse Rate:  [86-105] 94  (12/12 1205) Resp:  [20-28] 20  (12/12 0944) BP: (125-148)/(74-84) 129/74 mmHg (12/12 0944) SpO2:  [90 %-100 %] 97 % (12/12 1205) FiO2 (%):  [28 %] 28 % (12/12 1205) Weight:  [203 lb 11.3 oz (92.4 kg)] 203 lb 11.3 oz (92.4 kg) (12/12 0600)  Intake/Output from previous day:  Intake/Output Summary (Last 24 hours) at 10/22/11 1428 Last data filed at 10/22/11 1115  Gross per 24 hour  Intake      0 ml  Output   2625 ml  Net  -2625 ml    Labs:  Basename 10/22/11 0540 10/20/11 0545  WBC 8.9 8.8  HGB 12.2* 11.2*  PLT 280 256  LABCREA -- --  CREATININE 0.89 0.73   Estimated Creatinine Clearance: 89.5 ml/min (by C-G formula based on Cr of 0.89). No results found for this basename: VANCOTROUGH:2,VANCOPEAK:2,VANCORANDOM:2,GENTTROUGH:2,GENTPEAK:2,GENTRANDOM:2,TOBRATROUGH:2,TOBRAPEAK:2,TOBRARND:2,AMIKACINPEAK:2,AMIKACINTROU:2,AMIKACIN:2, in the last 72 hours   Microbiology: Recent Results (from the past 720 hour(s))  MRSA PCR SCREENING     Status: Normal   Collection Time   10/07/11 10:16 PM      Component Value Range Status Comment   MRSA by PCR NEGATIVE  NEGATIVE  Final   CULTURE, RESPIRATORY     Status: Normal   Collection Time   10/08/11  8:45 PM      Component Value Range Status Comment   Specimen Description ENDOTRACHEAL   Final    Special Requests NONE   Final    Gram Stain     Final    Value: ABUNDANT WBC PRESENT, PREDOMINANTLY PMN     NO SQUAMOUS EPITHELIAL CELLS SEEN     ABUNDANT GRAM POSITIVE COCCI     IN PAIRS   Culture     Final    Value: MODERATE  STREPTOCOCCUS PNEUMONIAE     Note: PENICILLIN SENSITIVE   Report Status 10/12/2011 FINAL   Final   CULTURE, BLOOD (ROUTINE X 2)     Status: Normal   Collection Time   10/10/11  6:41 PM      Component Value Range Status Comment   Specimen Description BLOOD LEFT FOREARM   Final    Special Requests BOTTLES DRAWN AEROBIC AND ANAEROBIC 10CC EACH   Final    Setup Time [id number]   Final    Culture NO GROWTH 5 DAYS   Final    Report Status 10/17/2011 FINAL   Final   CULTURE, BLOOD (ROUTINE X 2)     Status: Normal   Collection Time   10/10/11  6:50 PM      Component Value Range Status Comment   Specimen Description BLOOD LEFT FOREARM   Final    Special Requests BOTTLES DRAWN AEROBIC ONLY 10CC   Final    Setup Time [id number]   Final    Culture NO GROWTH 5 DAYS   Final    Report Status 10/17/2011 FINAL   Final   CULTURE, RESPIRATORY     Status: Normal (Preliminary result)   Collection Time   10/21/11  5:07 PM      Component Value Range  Status Comment   Specimen Description TRACHEAL ASPIRATE   Final    Special Requests NONE   Final    Gram Stain     Final    Value: ABUNDANT WBC PRESENT,BOTH PMN AND MONONUCLEAR     FEW SQUAMOUS EPITHELIAL CELLS PRESENT     ABUNDANT GRAM POSITIVE COCCI     IN PAIRS IN CLUSTERS   Culture NO GROWTH   Final    Report Status PENDING   Incomplete     Medical History: Past Medical History  Diagnosis Date  . Hypertension   . Gilbert's syndrome   . Kidney stones   . Olecranon bursitis     Scheduled Medications:     . antiseptic oral rinse  1 application Mouth Rinse QID  . cefTRIAXone (ROCEPHIN)  IV  1 g Intravenous Q24H  . chlorhexidine  15 mL Mouth/Throat BID  . sennosides  5 mL Per Tube BID   And  . docusate  50 mg Per Tube BID  . free water  200 mL Per Tube Q8H  . insulin aspart  0-9 Units Subcutaneous Q4H  . insulin aspart  3 Units Subcutaneous Q4H  . levETIRAcetam  750 mg Oral Q12H  . pantoprazole sodium  40 mg Per Tube Q24H  .  DISCONTD: sodium chloride  10 mL Intracatheter Q12H      Assessment:  70 y.o. male admitted on 10/07/2011, with r/o pneumonia. Pharmacy consulted to manage vancomycin. 12/11 trach aspirate with gram positive cocci.    Goal of Therapy:  1. Vancomycin trough level 15-20 mcg/ml  Plan:  1. Vancomycin 1250mg  IV Q12H. 2. Will order vancomycin trough as indicated.     Dineen Kid Thad Ranger, PharmD 10/22/2011, 2:28 PM

## 2011-10-23 DIAGNOSIS — R4182 Altered mental status, unspecified: Secondary | ICD-10-CM

## 2011-10-23 DIAGNOSIS — I169 Hypertensive crisis, unspecified: Secondary | ICD-10-CM | POA: Diagnosis present

## 2011-10-23 DIAGNOSIS — I629 Nontraumatic intracranial hemorrhage, unspecified: Secondary | ICD-10-CM

## 2011-10-23 DIAGNOSIS — E119 Type 2 diabetes mellitus without complications: Secondary | ICD-10-CM | POA: Diagnosis not present

## 2011-10-23 DIAGNOSIS — J9600 Acute respiratory failure, unspecified whether with hypoxia or hypercapnia: Secondary | ICD-10-CM

## 2011-10-23 DIAGNOSIS — J9610 Chronic respiratory failure, unspecified whether with hypoxia or hypercapnia: Secondary | ICD-10-CM

## 2011-10-23 DIAGNOSIS — R5381 Other malaise: Secondary | ICD-10-CM | POA: Diagnosis not present

## 2011-10-23 DIAGNOSIS — R739 Hyperglycemia, unspecified: Secondary | ICD-10-CM | POA: Diagnosis not present

## 2011-10-23 DIAGNOSIS — E870 Hyperosmolality and hypernatremia: Secondary | ICD-10-CM | POA: Diagnosis not present

## 2011-10-23 DIAGNOSIS — J411 Mucopurulent chronic bronchitis: Secondary | ICD-10-CM | POA: Diagnosis not present

## 2011-10-23 DIAGNOSIS — R509 Fever, unspecified: Secondary | ICD-10-CM

## 2011-10-23 LAB — GLUCOSE, CAPILLARY
Glucose-Capillary: 163 mg/dL — ABNORMAL HIGH (ref 70–99)
Glucose-Capillary: 166 mg/dL — ABNORMAL HIGH (ref 70–99)
Glucose-Capillary: 170 mg/dL — ABNORMAL HIGH (ref 70–99)
Glucose-Capillary: 179 mg/dL — ABNORMAL HIGH (ref 70–99)

## 2011-10-23 MED ORDER — FREE WATER
200.0000 mL | Freq: Four times a day (QID) | Status: DC
  Administered 2011-10-23 – 2011-10-24 (×5): 200 mL
  Filled 2011-10-23 (×8): qty 200

## 2011-10-23 MED ORDER — INSULIN GLARGINE 100 UNIT/ML ~~LOC~~ SOLN
5.0000 [IU] | Freq: Every day | SUBCUTANEOUS | Status: DC
  Administered 2011-10-23: 5 [IU] via SUBCUTANEOUS
  Filled 2011-10-23: qty 3

## 2011-10-23 NOTE — Progress Notes (Signed)
In to see pt this morning for treatment. RN Morrie Sheldon M.) said to please wait to see pt possibly in PM since pt with fever of 102 currently. Back to see pt this afternoon and pt's fever had gone down earlier but now up to 103 so decision between RN(Ashely M.) and myself was to defer treatment session. Ignacia Palma, OTR/L [phone] 10/23/2011

## 2011-10-23 NOTE — Progress Notes (Signed)
Name: Gregory Humphrey MRN: [id number] DOB: 1941/05/24  DOS: 10/07/2011    LOS: 16  F/u post ICU progress note  History of Present Illness: 70 y/o man with h/o HTN brought to [facility] ED 11/27 unresponsive after complaining of headache >  Intubated for airway protection.  Head CT demonstrated large acute subdural, subarachnoid and parenchymal hemorrhage. OR crani 11/28.  Lines / Drains: 11/27  ETT>>> 12/5  A line rt rad 11/28 >>>12/2 12/2 RUE PICC >>  12/5 #8 trach (DF) >> PEG (Pyrtle) 12/06>>  Cultures / Sepsis markers: BC x 2  11/28 > neg Sputum 11/28 >>> strep pneumo (PCN sensitive) Trach sputum 12/11 > wbc gpc >>  Antibiotics: (concern CAP  Vs asp) Ceftriaxone 11/28 >>> 12/5 levoflox 11/28 >>12/2 Ceftriaxone 12/12 (purulent sputum/fever)>>> vanc 12/12 (purulent sputum/fever)>>>  Tests / Events: 11/27  Found unresponsive.  Head CT demonstrated large acute subdural, subarachnoid and parenchimal hemorrhage. 11/28- OR drain SDH 11/28- CT angio, no aneursym 11/28  Craniotomy for spontaneous SDH evacuation  Subjective:   No changes over night, remains unresponsive to verbal, notes from 12/12 afternoon noted.   Vital Signs:  Tmax 101.7 (no change) BP 131/78  Pulse 88  Temp(Src) 100 F (37.8 C) (Oral)  Resp 16  Ht 5\' 10"  (1.778 m)  Wt 92.4 kg (203 lb 11.3 oz)  BMI 29.23 kg/m2  SpO2 94% I/O last 3 completed shifts: In: -  Out: 3675 [Urine:3675]    Physical Examination: Gen:   no acute distress/ no response to verbal, remains unresponsive HEENT: mm pink/moist, trach midline PULM: minimal bilateral rhonchi CV: RRR, no mgr, no JVD AB: BS+, soft, nontender, peg in place c/d/i Ext: warm, pitting edema, no clubbing, no cyanosis Derm: no rash or skin breakdown Neuro: somnolent with spontaneous movements all over, no response to verbal  Labs:  Lab 10/22/11 0540 10/20/11 0545 10/17/11 0411  NA 149* 143 142  K 4.3 3.9 3.7  CL 113* 108 110  CO2 26 26 22   BUN 28* 21  28*  CREATININE 0.89 0.73 0.78  GLUCOSE 193* 182* 169*    Lab 10/22/11 0540 10/20/11 0545 10/17/11 0411  HGB 12.2* 11.2* 12.4*  HCT 37.1* 34.6* 36.6*  WBC 8.9 8.8 11.7*  PLT 280 256 242      RADIOLOGY: cxr 12/12   No infiltrates   Assessment and Plan: 1) Large acute subdural hemorrhage with herniation. Now s/p crani, drainage. Remains in Persistent vegetative state.  Rec: -needs placement per neuro, he is trach and peg dep  2) Seizures risk - keppra per neurology  3) Acute ventilator dependent respiratory failure due to neuro status. Now resolved but remains trach dependant.  Plan/rec: -cont trach collar -pulm hygiene -not a candidate for decannulation  4)Purulent bronchitis - has h/o pneumococcus in sputum.  12/11 with increased sputum production and  Fever. Placed on IV vanc and rocephin on 12/12. Sputum production decreased. GPC growing in sputum Plan: -cont current abx. Await sputum culture to finalize, then narrow.   5) HTN emergency (resolved)  BP OK off nicardipine Plan: -monitor  6) Nutrition  --  GI, s/p PEG placement on 12/6 -- tolerating tube feeds  7) Hyperglycemia CBG (last 3)   Basename 10/23/11 0411 10/23/11 10/22/11 2006  GLUCAP 163* 178* 179*  plan - SSI -adjust lantus  10) Hypernatremia Lab Results  Component Value Date   NA 149* 10/22/2011   NA 143 10/20/2011   NA 142 10/17/2011  plan: -free water replacement  Best practices /  Disposition:  -->DNR, hope for meaningful recovery poor, wife understands. Will ask medicine service to pick up. No current critical care issues. Have spoke to Dr Butler Denmark who has graciously agreed to assign care to the triad services -->SCDs for DVT Px -->Protonix for GI Px -->TF - PEG 12/6 -->family updated at bedside/ hoping for SNF placement soon.      Sandrea Hughs, MD Pulmonary and Critical Care Medicine [facility] Cell [phone]

## 2011-10-23 NOTE — Progress Notes (Signed)
Stroke Team Progress Note  SUBJECTIVE Gregory Humphrey is a 70 y.o. male who feels their symptoms of are unchanged. Wife at the  Bedside. Again discussed pts diagnosis, hospital care and long-term plan.   OBJECTIVE Most recent Vital Signs: Temp: 101.4 F (38.6 C) (12/13 1203) Temp src: Axillary (12/13 1203) BP: 145/90 mmHg (12/13 1028) Pulse Rate: 91  (12/13 1028) Respiratory Rate: 17 O2 Saturdation: 92%  CBG (last 3)   Basename 10/23/11 0823 10/23/11 0411 10/23/11  GLUCAP 166* 163* 178*   Diet    tube feedings  Activity  Up with assistance  VTE Prophylaxis  SCDs   Studies: Results for orders placed during the hospital encounter of 10/07/11 (from the past 24 hour(s))  CULTURE, RESPIRATORY     Status: Normal (Preliminary result)   Collection Time   10/21/11  5:07 PM      Component Value Range   Specimen Description TRACHEAL ASPIRATE     Special Requests NONE     Gram Stain       Value: ABUNDANT WBC PRESENT,BOTH PMN AND MONONUCLEAR     FEW SQUAMOUS EPITHELIAL CELLS PRESENT     ABUNDANT GRAM POSITIVE COCCI     IN PAIRS IN CLUSTERS   Culture NO GROWTH     Report Status PENDING    BASIC METABOLIC PANEL     Status: Abnormal   Collection Time   10/22/11  5:40 AM      Component Value Range   Sodium 149 (*) 135 - 145 (mEq/L)   Potassium 4.3  3.5 - 5.1 (mEq/L)   Chloride 113 (*) 96 - 112 (mEq/L)   CO2 26  19 - 32 (mEq/L)   Glucose, Bld 193 (*) 70 - 99 (mg/dL)   BUN 28 (*) 6 - 23 (mg/dL)   Creatinine, Ser 4.09  0.50 - 1.35 (mg/dL)   Calcium 9.3  8.4 - 81.1 (mg/dL)   GFR calc non Af Amer 85 (*) >90 (mL/min)   GFR calc Af Amer >90  >90 (mL/min)  CBC     Status: Abnormal   Collection Time   10/22/11  5:40 AM      Component Value Range   WBC 8.9  4.0 - 10.5 (K/uL)   RBC 3.90 (*) 4.22 - 5.81 (MIL/uL)   Hemoglobin 12.2 (*) 13.0 - 17.0 (g/dL)   HCT 91.4 (*) 78.2 - 52.0 (%)   MCV 95.1  78.0 - 100.0 (fL)   MCH 31.3  26.0 - 34.0 (pg)   MCHC 32.9  30.0 - 36.0 (g/dL)   RDW 95.6  21.3 - 08.6 (%)   Platelets 280  150 - 400 (K/uL)    Physical Exam  The patient is lethargic, difficult to arouse. Positive doll's eyes are noted. Pupils are equal, round, reactive. The patient will move the upper extremity spontaneously Right more than left. Movement of the lower extremities is not seen. Deep tendon reflexes are depressed but symmetric. The patient has minimal response to deep pain stimulation. Patient will not follow commands, and cerebellar testing cannot be performed.  ASSESSMENT [name] is a 70 y.o. male with a large left SDH and 1.4 cm left to right shift with subfalcine and uncal herniation and cerebral edema post evacuation by Dr. Venetia Maxon. Hemorrhage secondary to ruptured smal left pareital cortical small AVM from coughing. No improvement with lethargy and unresponsiveness.   Seizures on Keppra  Hyperglycemia secondary to tube feedings   Stroke risk factors: family history and hypertension  Patient is medically ready for discharge to skilled nursing facility.  Wife may consider comfort care after the holidays.  Hospital day # 16  TREATMENT/PLAN SNF placement. Bed search underway. Discussed with the wife and answered questions  [name], ANP-BC, GNP-BC [facility] Pager: [phone] 10/23/2011 12:04 PM  Dr. Delia Heady, Stroke Center Medical Director, has personally reviewed chart, pertinent data, examined the patient and developed the plan of care.

## 2011-10-23 NOTE — Progress Notes (Addendum)
Pt wife has selected [facility].   CSW awaiting [employer] insurance authorization and planning d/c Friday. Pt medically ready per Dr Pearlean Brownie, and per Dr Sherene Sires.    PCCM, please advise of need to remove stitches on trach prior to d/c.   CSW will continue to follow.    Baxter Flattery, MSW [phone]

## 2011-10-23 NOTE — Progress Notes (Signed)
Subjective: Patient reports (aphasic)  Objective: Vital signs in last 24 hours: Temp:  [97.9 F (36.6 C)-101.7 F (38.7 C)] 100 F (37.8 C) (12/13 0600) Pulse Rate:  [84-100] 88  (12/13 0600) Resp:  [16-20] 16  (12/13 0600) BP: (122-149)/(65-78) 131/78 mmHg (12/13 0600) SpO2:  [92 %-99 %] 94 % (12/13 0600) FiO2 (%):  [28 %] 28 % (12/13 0600)  Intake/Output from previous day: 12/12 0701 - 12/13 0700 In: -  Out: 2275 [Urine:2275] Intake/Output this shift:    Resting quietly. Without change neurologically from yesterday's visit.  Lab Results:  [facility] 10/22/11 0540  WBC 8.9  HGB 12.2*  HCT 37.1*  PLT 280   BMET  Basename 10/22/11 0540  NA 149*  K 4.3  CL 113*  CO2 26  GLUCOSE 193*  BUN 28*  CREATININE 0.89  CALCIUM 9.3    Studies/Results: Dg Chest Port 1 View  10/22/2011  *RADIOLOGY REPORT*  Clinical Data: ET tube evaluation.  PORTABLE CHEST - 1 VIEW  Comparison: 10/16/2011.  Findings: Tracheostomy tube tip midline.  Right PICC line has been retracted or replaced and is an atypical position curled over the right lung apex.  This needs to be repositioned.  No gross pneumothorax.  Poor inspiration with basilar atelectasis.  Limited for detection of underlying basilar mass or infiltrate.  Calcified tortuous aorta.  Heart size within normal limits. Central pulmonary vascular prominence.  IMPRESSION: Right PICC line has been retracted or replaced and is an atypical position curled over the right lung apex.  This needs to be repositioned.  Original Report Authenticated By: Fuller Canada, M.D.    Assessment/Plan: Now on IVAB after sputum cx. Without change neurologically.  LOS: 16 days  SNF with trach care capabilities search underway.    Georgiann Cocker 10/23/2011, 8:05 AM    Patient remains unchanged neurologically.  Prognosis for meaningful recovery remains poor.

## 2011-10-24 LAB — GLUCOSE, CAPILLARY
Glucose-Capillary: 167 mg/dL — ABNORMAL HIGH (ref 70–99)
Glucose-Capillary: 154 mg/dL — ABNORMAL HIGH (ref 70–99)

## 2011-10-24 LAB — CULTURE, RESPIRATORY W GRAM STAIN

## 2011-10-24 MED ORDER — DOCUSATE SODIUM 50 MG/5ML PO LIQD: 50.0000 mg | Freq: Two times a day (BID) | ORAL | Status: DC

## 2011-10-24 MED ORDER — SENNOSIDES 8.8 MG/5ML PO SYRP: 5.0000 mL | ORAL_SOLUTION | Freq: Two times a day (BID) | ORAL | Status: DC

## 2011-10-24 MED ORDER — INSULIN GLARGINE 100 UNIT/ML ~~LOC~~ SOLN: 5.0000 [IU] | Freq: Every day | SUBCUTANEOUS | Status: DC

## 2011-10-24 MED ORDER — MOXIFLOXACIN HCL IN NACL 400 MG/250ML IV SOLN
400.0000 mg | INTRAVENOUS | Status: DC
  Filled 2011-10-24: qty 250

## 2011-10-24 MED ORDER — PROMOTE PO LIQD: 1000.0000 mL | ORAL | Status: DC

## 2011-10-24 MED ORDER — MOXIFLOXACIN HCL IN NACL 400 MG/250ML IV SOLN: 400.0000 mg | INTRAVENOUS | Status: DC

## 2011-10-24 MED ORDER — DEXTROSE 5 % IV SOLN
2.0000 g | Freq: Two times a day (BID) | INTRAVENOUS | Status: DC
  Filled 2011-10-24 (×2): qty 2

## 2011-10-24 MED ORDER — LEVETIRACETAM 100 MG/ML PO SOLN: 750.0000 mg | Freq: Two times a day (BID) | ORAL | Status: DC

## 2011-10-24 MED ORDER — SODIUM CHLORIDE 0.9 % IJ SOLN: 10.0000 mL | INTRAMUSCULAR | Status: DC | PRN

## 2011-10-24 MED ORDER — INSULIN ASPART 100 UNIT/ML ~~LOC~~ SOLN: 0.0000 [IU] | SUBCUTANEOUS | Status: DC

## 2011-10-24 MED ORDER — PANTOPRAZOLE SODIUM 40 MG PO PACK: 40.0000 mg | PACK | ORAL | Status: DC

## 2011-10-24 MED ORDER — FREE WATER: 200.0000 mL | Freq: Four times a day (QID) | Status: DC

## 2011-10-24 NOTE — Progress Notes (Signed)
Stroke Team Progress Note  SUBJECTIVE [name] is a 70 y.o. male who feels their symptoms of are unchanged. Wife at the  Bedside. SNF bed available for discharge today.  OBJECTIVE Most recent Vital Signs: Temp: 98.4 F (36.9 C) (12/14 0510) Temp src: Axillary (12/14 0510) BP: 141/81 mmHg (12/14 0510) Pulse Rate: 76  (12/14 0510) Respiratory Rate: 18 O2 Saturdation: 95%  CBG (last 3)   Basename 10/24/11 0753 10/24/11 0515 10/24/11 0205  GLUCAP 167* 174* 163*   Diet    tube feedings  Activity  Up with assistance  VTE Prophylaxis  SCDs   Physical Exam  The patient is lethargic, difficult to arouse. Positive doll's eyes are noted. Pupils are equal, round, reactive. The patient will move the upper extremity spontaneously Right more than left. Movement of the lower extremities is not seen. Deep tendon reflexes are depressed but symmetric. The patient has minimal response to deep pain stimulation. Patient will not follow commands, and cerebellar testing cannot be performed.  ASSESSMENT [name] is a 70 y.o. male with a large left SDH and 1.4 cm left to right shift with subfalcine and uncal herniation and cerebral edema post evacuation by Dr. Venetia Maxon. Hemorrhage secondary to ruptured smal left pareital cortical small AVM from coughing. No improvement with lethargy and unresponsiveness status post trach and PEG. Neuro prognosis limited.  Seizures on Keppra  Hyperglycemia secondary to tube feedings   Stroke risk factors: family history and hypertension   Patient is medically ready for discharge to skilled nursing facility.  Wife may consider comfort care after the holidays.  Hospital day # 17  TREATMENT/PLAN SNF placement.  Discussed with the wife and answered questions. Gave her follow up contact information.  [name], ANP-BC, GNP-BC [facility] Pager: [phone] 10/24/2011 10:25 AM  Dr. Delia Heady, Stroke Center Medical  Director, has personally reviewed chart, pertinent data, examined the patient and developed the plan of care.

## 2011-10-24 NOTE — Progress Notes (Signed)
CSW reviewed chart which stated patient has chosen a bed at [place] but needs insurance authorization. CSW called insurance company who stated they needed additional information. CSW sent further PT/OT notes for insurance to review. CSW informed MD that we are waiting on insurance approval. CSW will continue to follow. [place], [place] [phone]

## 2011-10-24 NOTE — Progress Notes (Signed)
Patient received a left forearm peripheral IV dated 10/24/2011 that is saline locked.  The PICC line is removed.  Scarlette Shorts RN

## 2011-10-24 NOTE — Progress Notes (Signed)
Medicare authorization received for SNF. CSW has called [facility] to transfer pt to [place]. Spoke with family at length, providing support. Family is hopeful, as pt has opened his eyes several times today in response to stimulation. CSW signing off.  Baxter Flattery, MSW [phone]

## 2011-10-24 NOTE — Progress Notes (Signed)
Transferring to NH.  Hopefully, will improve neurologic function.

## 2011-10-24 NOTE — Progress Notes (Signed)
Subjective: Patient reports (aphasic)  Objective: Vital signs in last 24 hours: Temp:  [98.4 F (36.9 C)-102.6 F (39.2 C)] 98.7 F (37.1 C) (12/14 1000) Pulse Rate:  [74-91] 74  (12/14 1000) Resp:  [18-22] 18  (12/14 1000) BP: (134-148)/(72-81) 148/80 mmHg (12/14 1000) SpO2:  [92 %-100 %] 96 % (12/14 1000) FiO2 (%):  [28 %] 28 % (12/14 1000)  Intake/Output from previous day: 12/13 0701 - 12/14 0700 In: 1970 [NG/GT:400; IV Piggyback:550] Out: 2325 [Urine:2325] Intake/Output this shift:    Without change neurologically. Wife at bedside.  Lab Results:  [facility] 10/22/11 0540  WBC 8.9  HGB 12.2*  HCT 37.1*  PLT 280   BMET  Basename 10/22/11 0540  NA 149*  K 4.3  CL 113*  CO2 26  GLUCOSE 193*  BUN 28*  CREATININE 0.89  CALCIUM 9.3    Studies/Results: No results found.  Assessment/Plan: Without change neurologically.  LOS: 17 days  SNF [facility]) placement today planned.   Georgiann Cocker 10/24/2011, 11:37 AM

## 2011-10-24 NOTE — Progress Notes (Signed)
Name: Gregory Humphrey MRN: [id number] DOB: [birth year]  DOS: 10/07/2011    LOS: 17  F/u post ICU progress note  History of Present Illness: 70 y/o man with h/o HTN brought to [facility] ED 11/27 unresponsive after complaining of headache >  Intubated for airway protection.  Head CT demonstrated large acute subdural, subarachnoid and parenchymal hemorrhage. OR crani 11/28.  Lines / Drains: 11/27  ETT>>> 12/5  A line rt rad 11/28 >>>12/2 12/2 RUE PICC >>  12/5 #8 trach (DF) >> PEG (Pyrtle) 12/06>>  Cultures / Sepsis markers: BC x 2  11/28 > neg Sputum 11/28 >>> strep pneumo (PCN sensitive) Trach sputum 12/11: group F strep   Antibiotics: (concern CAP  Vs asp) Ceftriaxone 11/28 >>> 12/5 levoflox 11/28 >>12/2 Ceftriaxone 12/12 (purulent sputum/fever)>>>12/14 vanc 12/12 (purulent sputum/fever)>>>12/14 avelox 12/14 (strep)>>>  Tests / Events: 11/27  Found unresponsive.  Head CT demonstrated large acute subdural, subarachnoid and parenchimal hemorrhage. 11/28- OR drain SDH 11/28- CT angio, no aneursym 11/28  Craniotomy for spontaneous SDH evacuation  Subjective:   More awake today. Moves Right side. Left sided hemiparesis continues. Not following commands  Vital Signs:  Tmax 101.7 (no change) BP 148/80  Pulse 74  Temp(Src) 98.7 F (37.1 C) (Axillary)  Resp 18  Ht 5\' 10"  (1.778 m)  Wt 92.4 kg (203 lb 11.3 oz)  BMI 29.23 kg/m2  SpO2 96% I/O last 3 completed shifts: In: 3700 [Other:2100; NG/GT:800; IV Piggyback:800] Out: 3800 [Urine:3800]    Physical Examination: Gen:   no acute distress, left sided hemiparesis.  HEENT: mm pink/moist, trach midline PULM: minimal bilateral rhonchi CV: RRR, no mgr, no JVD AB: BS+, soft, nontender, peg in place c/d/i Ext: warm, pitting edema, no clubbing, no cyanosis Derm: no rash or skin breakdown Neuro: somnolent with spontaneous movements all over, no response to verbal  Labs:  Lab 10/22/11 0540 10/20/11 0545  NA 149* 143  K 4.3  3.9  CL 113* 108  CO2 26 26  BUN 28* 21  CREATININE 0.89 0.73  GLUCOSE 193* 182*    Lab 10/22/11 0540 10/20/11 0545  HGB 12.2* 11.2*  HCT 37.1* 34.6*  WBC 8.9 8.8  PLT 280 256      RADIOLOGY: cxr 12/12   No infiltrates   Assessment and Plan: 1) Large acute subdural hemorrhage with herniation. Now s/p crani. Remains in Persistent vegetative state.  Rec: -continue supportive care  2) Seizures risk - keppra per neurology  3) Acute ventilator dependent respiratory failure due to neuro status. Now resolved but remains trach dependant.  Plan/rec: -cont trach collar -pulm hygiene -not a candidate for decannulation -stitches out 12/14  4)Purulent bronchitis - + group F strep Plan: -8 days of avelox, then D/C PICC  5) HTN emergency (resolved)  BP OK off nicardipine Plan: -monitor  6) Nutrition  -- [facility], s/p PEG placement on 12/6 -- tolerating tube feeds  7) Hyperglycemia CBG (last 3)   Basename 10/24/11 1221 10/24/11 0753 10/24/11 0515  GLUCAP 154* 167* 174*  plan - SSI -adjust lantus  10) Hypernatremia Lab Results  Component Value Date   NA 149* 10/22/2011   NA 143 10/20/2011   NA 142 10/17/2011  plan: -free water replacement  Best practices / Disposition:  -->DNR -->SCDs for DVT Px -->Protonix for GI Px -->TF - PEG 12/6 -->family updated, for SNF today.      Sandrea Hughs, MD Pulmonary and Critical Care Medicine [facility] Cell [phone]

## 2011-10-24 NOTE — Progress Notes (Signed)
CSW in communication with [facility] Medicare to obtain pre auth for SNF. Pt wife had insisted on appeal for LTAC, which was declined. [employer] now processing request for SNF authorization, hopeful for d/c today to [facility].   CSW spoke with Dr Sherene Sires who confirms that PCCM will d/c pt today, rather than switching pt to [facility].   Pt wife advised of possible d/c and is prepared to complete admissions paperwork at [facility].   NP, please provide specifications for trach settings for SNF as soon as possible.   CSW will continue to follow.    Baxter Flattery, MSW [phone]

## 2011-10-25 ENCOUNTER — Emergency Department (HOSPITAL_COMMUNITY)
Admission: EM | Admit: 2011-10-25 | Discharge: 2011-10-25 | Disposition: A | Discharge status: Home / Self Care | Payer: Medicare Other | Attending: Emergency Medicine | Admitting: Emergency Medicine

## 2011-10-25 ENCOUNTER — Other Ambulatory Visit: Payer: Self-pay

## 2011-10-25 ENCOUNTER — Encounter (HOSPITAL_COMMUNITY): Payer: Self-pay | Admitting: *Deleted

## 2011-10-25 ENCOUNTER — Emergency Department (HOSPITAL_COMMUNITY): Payer: Medicare Other

## 2011-10-25 DIAGNOSIS — Z930 Tracheostomy status: Secondary | ICD-10-CM | POA: Insufficient documentation

## 2011-10-25 DIAGNOSIS — R6889 Other general symptoms and signs: Secondary | ICD-10-CM | POA: Insufficient documentation

## 2011-10-25 DIAGNOSIS — I10 Essential (primary) hypertension: Secondary | ICD-10-CM | POA: Insufficient documentation

## 2011-10-25 DIAGNOSIS — Z794 Long term (current) use of insulin: Secondary | ICD-10-CM | POA: Insufficient documentation

## 2011-10-25 DIAGNOSIS — J9610 Chronic respiratory failure, unspecified whether with hypoxia or hypercapnia: Secondary | ICD-10-CM | POA: Insufficient documentation

## 2011-10-25 DIAGNOSIS — R403 Persistent vegetative state: Secondary | ICD-10-CM | POA: Insufficient documentation

## 2011-10-25 DIAGNOSIS — Z79899 Other long term (current) drug therapy: Secondary | ICD-10-CM | POA: Insufficient documentation

## 2011-10-25 DIAGNOSIS — R509 Fever, unspecified: Secondary | ICD-10-CM | POA: Insufficient documentation

## 2011-10-25 LAB — URINALYSIS, ROUTINE W REFLEX MICROSCOPIC
Glucose, UA: NEGATIVE mg/dL
Nitrite: NEGATIVE
Protein, ur: NEGATIVE mg/dL
Urobilinogen, UA: 1 mg/dL (ref 0.0–1.0)

## 2011-10-25 LAB — URINE MICROSCOPIC-ADD ON

## 2011-10-25 LAB — CBC
HCT: 36 % — ABNORMAL LOW (ref 39.0–52.0)
Hemoglobin: 11.9 g/dL — ABNORMAL LOW (ref 13.0–17.0)
MCH: 31.3 pg (ref 26.0–34.0)
MCHC: 33.1 g/dL (ref 30.0–36.0)
MCV: 94.7 fL (ref 78.0–100.0)

## 2011-10-25 LAB — DIFFERENTIAL
Basophils Relative: 0 % (ref 0–1)
Monocytes Absolute: 0.5 10*3/uL (ref 0.1–1.0)
Monocytes Relative: 7 % (ref 3–12)
Neutro Abs: 5.5 10*3/uL (ref 1.7–7.7)

## 2011-10-25 LAB — COMPREHENSIVE METABOLIC PANEL
Albumin: 2.3 g/dL — ABNORMAL LOW (ref 3.5–5.2)
BUN: 29 mg/dL — ABNORMAL HIGH (ref 6–23)
Chloride: 113 mEq/L — ABNORMAL HIGH (ref 96–112)
Creatinine, Ser: 0.83 mg/dL (ref 0.50–1.35)
GFR calc non Af Amer: 88 mL/min — ABNORMAL LOW (ref >90)
Total Bilirubin: 0.6 mg/dL (ref 0.3–1.2)

## 2011-10-25 MED ORDER — ACETAMINOPHEN 650 MG RE SUPP
650.0000 mg | Freq: Once | RECTAL | Status: AC
  Administered 2011-10-25: 650 mg via RECTAL
  Filled 2011-10-25: qty 1

## 2011-10-25 NOTE — ED Provider Notes (Signed)
History     CSN: [id number] Arrival date & time: 2011-11-15  4:47 PM   First MD Initiated Contact with Patient 15-Nov-2011 1650      Chief Complaint  Patient presents with  . Fever    (Consider location/radiation/quality/duration/timing/severity/associated sxs/prior treatment) HPI.... level V caveat for profound impairment.  Patient is in a persistent vegetative state from a recent rupture of a arteriovenous malformation. He was discharged from the hospital yesterday to the nursing home. Today he had a fever. No change in behavior.  Temperature was 102 today.  Past Medical History  Diagnosis Date  . Hypertension   . Gilbert's syndrome   . Kidney stones   . Olecranon bursitis     Past Surgical History  Procedure Date  . Refractive surgery 1995  . Craniotomy 10/08/2011    Procedure: CRANIOTOMY HEMATOMA EVACUATION SUBDURAL;  Surgeon: Dorian Heckle, MD;  Location: [facility];  Service: Neurosurgery;  Laterality: Left;  Left Craniotomy for subdural  . Peg placement 10/16/2011    Procedure: PERCUTANEOUS ENDOSCOPIC GASTROSTOMY (PEG) PLACEMENT;  Surgeon: Erick Blinks, MD;  Location: [facility] ENDOSCOPY;  Service: Gastroenterology;  Laterality: N/A;    Family History  Problem Relation Age of Onset  . Stroke Mother   . Hypertension Mother   . Diabetes Mother   . Cancer Father     bone  . Alcohol abuse Brother     past  . Stroke Maternal Grandfather     History  Substance Use Topics  . Smoking status: Never Smoker   . Smokeless tobacco: Never Used  . Alcohol Use: No      Review of Systems  Unable to perform ROS: Other    Allergies  Review of patient's allergies indicates no known allergies.  Home Medications   Current Outpatient Rx  Name Route Sig Dispense Refill  . DOCUSATE SODIUM 60 MG/15ML PO SYRP Oral Take 60 mg by mouth 2 (two) times daily.      . INSULIN ASPART 100 UNIT/ML [place] SOLN Subcutaneous Inject 3 Units into the skin every 4 (four) hours. Check CBG every 6  hours, if greater than 150 give 3 units.     . INSULIN GLARGINE 100 UNIT/ML [place] SOLN Subcutaneous Inject 5 Units into the skin at bedtime.      Marland Kitchen LEVETIRACETAM 100 MG/ML PO SOLN Oral Take 750 mg by mouth every 12 (twelve) hours.      Marland Kitchen MOXIFLOXACIN HCL IN NACL 400 MG/250ML IV SOLN Intravenous Inject 400 mg into the vein daily. Daily at 9am.     . PROMOTE PO LIQD Per Tube Place 1,000 mLs into feeding tube continuous.      Marland Kitchen PANTOPRAZOLE 40 MG/20 ML SUSPENSION Per Tube Place 40 mg into feeding tube daily.      . SENNOSIDES 8.8 MG/5ML PO SYRP Per Tube Place 5 mLs into feeding tube 2 (two) times daily.      . SODIUM CHLORIDE 0.9 % IJ SOLN Intracatheter 10 mLs by Intracatheter route every 8 (eight) hours.      Marland Kitchen FREE WATER Per Tube Place 200 mLs into feeding tube every 6 (six) hours.        BP 119/72  Pulse 89  Temp(Src) 100 F (37.8 C) (Rectal)  Resp 20  SpO2 96%  Physical Exam  Nursing note and vitals reviewed. Constitutional:       Obtunded.  does not respond to command  HENT:  Head: Atraumatic.  Neck: Normal range of motion. Neck supple.  Trach in place  Cardiovascular: Normal rate and regular rhythm.   Pulmonary/Chest: Effort normal and breath sounds normal.  Abdominal: Soft. Bowel sounds are normal.  Musculoskeletal:       Unable  Neurological:       Does not respond to verbal or tactile stimuli  Skin: Skin is warm and dry.  Psychiatric:       Unable    ED Course  Procedures (including critical care time)  Labs Reviewed  CBC - Abnormal; Notable for the following:    RBC 3.80 (*)    Hemoglobin 11.9 (*)    HCT 36.0 (*)    All other components within normal limits  COMPREHENSIVE METABOLIC PANEL - Abnormal; Notable for the following:    Sodium 148 (*)    Chloride 113 (*)    Glucose, Bld 144 (*)    BUN 29 (*)    Albumin 2.3 (*)    AST 54 (*)    ALT 132 (*)    GFR calc non Af Amer 88 (*)    All other components within normal limits  URINALYSIS, ROUTINE W REFLEX  MICROSCOPIC - Abnormal; Notable for the following:    Hgb urine dipstick LARGE (*)    Leukocytes, UA TRACE (*)    All other components within normal limits  URINE MICROSCOPIC-ADD ON - Abnormal; Notable for the following:    Squamous Epithelial / LPF FEW (*)    All other components within normal limits  DIFFERENTIAL  URINE CULTURE  CULTURE, BLOOD (ROUTINE X 2)  CULTURE, BLOOD (ROUTINE X 2)  Dg Chest Port 1 View  11/14/2011  *RADIOLOGY REPORT*  Clinical Data: Fever.  Rule out pneumonia.  PORTABLE CHEST - 1 VIEW  Comparison: 10/22/2011  Findings: Tracheostomy tube tip is above the carina.  The interval removal of right arm PICC line.  The heart size appears normal.  Lung volumes are low and there is atelectasis noted within both lung bases left greater than right.  IMPRESSION:  1.  Low lung volumes and bibasilar atelectasis. 2.  Tip of tracheostomy tube is in appropriate position of above the carina.  Original Report Authenticated By: Rosealee Albee, M.D.   No results found.   No diagnosis found.   Date: 11/14/11  Rate: 90  Rhythm: normal sinus rhythm  QRS Axis: normal  Intervals: normal  ST/T Wave abnormalities: normal  Conduction Disutrbances:none  Narrative Interpretation:   Old EKG Reviewed: changes noted   MDM  Urine sample and chest x-ray normal. Blood cultures pending. No clinical need to be admitted to hospital. Discussed test results with wife and son. They understand  situation        Donnetta Hutching, MD 11-14-11 2017

## 2011-10-25 NOTE — ED Notes (Addendum)
Pt to ed for eval of fever. Pt was just d/c'd from facility yesterday after having a venous-arterial malformation rupture. Pt obtunded and has trach. EMS familiar with pt states that while in hospital, staff experienced trouble controlling pt's temp.

## 2011-10-25 NOTE — Discharge Instructions (Signed)
Urine sample and chest x-ray did not show any obvious infection. Suspect viral etiology or fever. Can have Tylenol every 4-6 hours. Followup with normal physician

## 2011-10-25 NOTE — ED Notes (Signed)
Pt temp increasing slightly.  Given tylenol.  Await PTAR.

## 2011-10-28 LAB — URINE CULTURE

## 2011-10-29 NOTE — ED Notes (Signed)
+   urine Chart sent to EDP office for review. 

## 2011-11-01 LAB — CULTURE, BLOOD (ROUTINE X 2)
Culture  Setup Time: 201212160238
Culture: NO GROWTH

## 2011-11-04 ENCOUNTER — Inpatient Hospital Stay (HOSPITAL_COMMUNITY)
Admission: EM | Admit: 2011-11-04 | Discharge: 2011-11-11 | DRG: 194 | Disposition: A | Discharge status: Other Acute Inpatient Hospital | Payer: Medicare Other | Attending: Internal Medicine | Admitting: Internal Medicine

## 2011-11-04 ENCOUNTER — Emergency Department (HOSPITAL_COMMUNITY): Payer: Medicare Other

## 2011-11-04 ENCOUNTER — Encounter (HOSPITAL_COMMUNITY): Payer: Self-pay | Admitting: Internal Medicine

## 2011-11-04 DIAGNOSIS — J9610 Chronic respiratory failure, unspecified whether with hypoxia or hypercapnia: Secondary | ICD-10-CM | POA: Diagnosis present

## 2011-11-04 DIAGNOSIS — R5381 Other malaise: Secondary | ICD-10-CM

## 2011-11-04 DIAGNOSIS — E749 Disorder of carbohydrate metabolism, unspecified: Secondary | ICD-10-CM | POA: Diagnosis present

## 2011-11-04 DIAGNOSIS — I10 Essential (primary) hypertension: Secondary | ICD-10-CM | POA: Diagnosis present

## 2011-11-04 DIAGNOSIS — E870 Hyperosmolality and hypernatremia: Secondary | ICD-10-CM | POA: Diagnosis present

## 2011-11-04 DIAGNOSIS — Z931 Gastrostomy status: Secondary | ICD-10-CM

## 2011-11-04 DIAGNOSIS — R403 Persistent vegetative state: Secondary | ICD-10-CM | POA: Diagnosis present

## 2011-11-04 DIAGNOSIS — A419 Sepsis, unspecified organism: Secondary | ICD-10-CM

## 2011-11-04 DIAGNOSIS — L89309 Pressure ulcer of unspecified buttock, unspecified stage: Secondary | ICD-10-CM | POA: Diagnosis present

## 2011-11-04 DIAGNOSIS — G8194 Hemiplegia, unspecified affecting left nondominant side: Secondary | ICD-10-CM | POA: Diagnosis present

## 2011-11-04 DIAGNOSIS — J189 Pneumonia, unspecified organism: Principal | ICD-10-CM | POA: Diagnosis present

## 2011-11-04 DIAGNOSIS — J40 Bronchitis, not specified as acute or chronic: Secondary | ICD-10-CM | POA: Diagnosis present

## 2011-11-04 DIAGNOSIS — E876 Hypokalemia: Secondary | ICD-10-CM | POA: Diagnosis present

## 2011-11-04 DIAGNOSIS — I69959 Hemiplegia and hemiparesis following unspecified cerebrovascular disease affecting unspecified side: Secondary | ICD-10-CM

## 2011-11-04 DIAGNOSIS — Z79899 Other long term (current) drug therapy: Secondary | ICD-10-CM

## 2011-11-04 DIAGNOSIS — L8992 Pressure ulcer of unspecified site, stage 2: Secondary | ICD-10-CM | POA: Diagnosis present

## 2011-11-04 DIAGNOSIS — D649 Anemia, unspecified: Secondary | ICD-10-CM | POA: Diagnosis present

## 2011-11-04 DIAGNOSIS — Z930 Tracheostomy status: Secondary | ICD-10-CM

## 2011-11-04 DIAGNOSIS — R509 Fever, unspecified: Secondary | ICD-10-CM

## 2011-11-04 DIAGNOSIS — L8990 Pressure ulcer of unspecified site, unspecified stage: Secondary | ICD-10-CM | POA: Diagnosis present

## 2011-11-04 DIAGNOSIS — D72829 Elevated white blood cell count, unspecified: Secondary | ICD-10-CM | POA: Diagnosis present

## 2011-11-04 DIAGNOSIS — A4902 Methicillin resistant Staphylococcus aureus infection, unspecified site: Secondary | ICD-10-CM | POA: Diagnosis present

## 2011-11-04 DIAGNOSIS — Z66 Do not resuscitate: Secondary | ICD-10-CM | POA: Diagnosis present

## 2011-11-04 DIAGNOSIS — R7309 Other abnormal glucose: Secondary | ICD-10-CM | POA: Diagnosis present

## 2011-11-04 DIAGNOSIS — L89899 Pressure ulcer of other site, unspecified stage: Secondary | ICD-10-CM | POA: Diagnosis present

## 2011-11-04 LAB — DIFFERENTIAL
Basophils Relative: 0 % (ref 0–1)
Lymphs Abs: 1.3 10*3/uL (ref 0.7–4.0)
Monocytes Relative: 4 % (ref 3–12)
Neutro Abs: 11.3 10*3/uL — ABNORMAL HIGH (ref 1.7–7.7)
Neutrophils Relative %: 86 % — ABNORMAL HIGH (ref 43–77)

## 2011-11-04 LAB — URINALYSIS, ROUTINE W REFLEX MICROSCOPIC
Ketones, ur: NEGATIVE mg/dL
Leukocytes, UA: NEGATIVE
Nitrite: NEGATIVE
Specific Gravity, Urine: 1.027 (ref 1.005–1.030)
Urobilinogen, UA: 2 mg/dL — ABNORMAL HIGH (ref 0.0–1.0)

## 2011-11-04 LAB — URINE MICROSCOPIC-ADD ON

## 2011-11-04 LAB — BASIC METABOLIC PANEL
BUN: 28 mg/dL — ABNORMAL HIGH (ref 6–23)
Chloride: 116 mEq/L — ABNORMAL HIGH (ref 96–112)
GFR calc Af Amer: >90 mL/min (ref >90)
Potassium: 4 mEq/L (ref 3.5–5.1)
Sodium: 154 mEq/L — ABNORMAL HIGH (ref 135–145)

## 2011-11-04 LAB — CBC
Hemoglobin: 13.1 g/dL (ref 13.0–17.0)
MCHC: 32.4 g/dL (ref 30.0–36.0)
Platelets: 202 10*3/uL (ref 150–400)
RBC: 4.23 MIL/uL (ref 4.22–5.81)

## 2011-11-04 LAB — LACTIC ACID, PLASMA: Lactic Acid, Venous: 2.3 mmol/L — ABNORMAL HIGH (ref 0.5–2.2)

## 2011-11-04 MED ORDER — SODIUM CHLORIDE 0.9 % IV BOLUS (SEPSIS)
1000.0000 mL | Freq: Once | INTRAVENOUS | Status: AC
  Administered 2011-11-05: 1000 mL via INTRAVENOUS

## 2011-11-04 MED ORDER — VANCOMYCIN HCL IN DEXTROSE 1-5 GM/200ML-% IV SOLN
1000.0000 mg | Freq: Once | INTRAVENOUS | Status: AC
Start: 2011-11-04 — End: 2011-11-04
  Administered 2011-11-04: 1000 mg via INTRAVENOUS
  Filled 2011-11-04: qty 200

## 2011-11-04 MED ORDER — SODIUM CHLORIDE 0.9 % IV BOLUS (SEPSIS)
1000.0000 mL | Freq: Once | INTRAVENOUS | Status: AC
  Administered 2011-11-04: 1000 mL via INTRAVENOUS

## 2011-11-04 MED ORDER — ACETAMINOPHEN 325 MG RE SUPP
325.0000 mg | Freq: Once | RECTAL | Status: AC
  Administered 2011-11-04: 325 mg via RECTAL
  Filled 2011-11-04: qty 1

## 2011-11-04 MED ORDER — PIPERACILLIN-TAZOBACTAM 3.375 G IVPB
3.3750 g | Freq: Once | INTRAVENOUS | Status: AC
  Administered 2011-11-04: 3.375 g via INTRAVENOUS
  Filled 2011-11-04: qty 50

## 2011-11-04 NOTE — ED Provider Notes (Signed)
History     CSN: [id number]  Arrival date & time 11/04/11  2030   First MD Initiated Contact with Patient 11/04/11 2041      Chief Complaint  Patient presents with  . Fever    (Consider location/radiation/quality/duration/timing/severity/associated sxs/prior treatment) HPI Comments: Level V caveat secondary to severe impairment. Patient and a persistent vegetative state secondary to a intracranial hemorrhage. Presents from his nursing facility for fever and shortness of breath as reported by the nursing facility. Patient has a tracheostomy. He also has a G-tube placed.  Patient is a 70 y.o. male presenting with fever. The history is provided by the nursing home and the spouse.  Fever Primary symptoms of the febrile illness include fever. The current episode started today. This is a new problem.    Past Medical History  Diagnosis Date  . Hypertension   . Gilbert's syndrome   . Kidney stones   . Olecranon bursitis     Past Surgical History  Procedure Date  . Refractive surgery 1995  . Craniotomy 10/08/2011    Procedure: CRANIOTOMY HEMATOMA EVACUATION SUBDURAL;  Surgeon: Dorian Heckle, MD;  Location: [facility];  Service: Neurosurgery;  Laterality: Left;  Left Craniotomy for subdural  . Peg placement 10/16/2011    Procedure: PERCUTANEOUS ENDOSCOPIC GASTROSTOMY (PEG) PLACEMENT;  Surgeon: Erick Blinks, MD;  Location: [facility] ENDOSCOPY;  Service: Gastroenterology;  Laterality: N/A;    Family History  Problem Relation Age of Onset  . Stroke Mother   . Hypertension Mother   . Diabetes Mother   . Cancer Father     bone  . Alcohol abuse Brother     past  . Stroke Maternal Grandfather     History  Substance Use Topics  . Smoking status: Never Smoker   . Smokeless tobacco: Never Used  . Alcohol Use: No      Review of Systems  Unable to perform ROS: Intubated  Constitutional: Positive for fever.    Allergies  Review of patient's allergies indicates no known  allergies.  Home Medications   Current Outpatient Rx  Name Route Sig Dispense Refill  . ACETAMINOPHEN ER 650 MG PO TBCR Oral Take 650 mg by mouth every 6 (six) hours as needed. For pain      . DOCUSATE SODIUM 60 MG/15ML PO SYRP Oral Take 60 mg by mouth 2 (two) times daily.      . INSULIN ASPART 100 UNIT/ML [place] SOLN Subcutaneous Inject 3 Units into the skin every 4 (four) hours. Check CBG every 6 hours, if greater than 150 give 3 units.     . SENNOSIDES 8.8 MG/5ML PO SYRP Per Tube Place 5 mLs into feeding tube 2 (two) times daily.        BP 129/73  Pulse 107  Temp(Src) 102.6 F (39.2 C) (Rectal)  Resp 20  SpO2 100%  Physical Exam  Nursing note and vitals reviewed. Constitutional: He appears well-developed and well-nourished. No distress.  HENT:  Head: Normocephalic and atraumatic.  Mouth/Throat: Oropharynx is clear and moist. No oropharyngeal exudate.  Eyes: Conjunctivae and EOM are normal. Pupils are equal, round, and reactive to light.  Neck: Normal range of motion. Neck supple.  Cardiovascular: Regular rhythm, normal heart sounds and intact distal pulses.  Exam reveals no gallop and no friction rub.   No murmur heard.      Tachycardic rate  Pulmonary/Chest: He is in respiratory distress (tachypneic).       rhonchorus breath sounds  Abdominal: Soft. Bowel sounds  are normal. There is no tenderness.  Neurological:       obtunded  Skin: Skin is warm and dry. No rash noted.    ED Course  Procedures (including critical care time)  CRITICAL CARE Performed by: [place] Bailiff   Total critical care time: 30 min  Critical care time was exclusive of separately billable procedures and treating other patients.  Critical care was necessary to treat or prevent imminent or life-threatening deterioration.  Critical care was time spent personally by me on the following activities: development of treatment plan with patient and/or surrogate as well as nursing, discussions with  consultants, evaluation of patient's response to treatment, examination of patient, obtaining history from patient or surrogate, ordering and performing treatments and interventions, ordering and review of laboratory studies, ordering and review of radiographic studies, pulse oximetry and re-evaluation of patient's condition.  Labs Reviewed  CBC - Abnormal; Notable for the following:    WBC 13.2 (*)    All other components within normal limits  DIFFERENTIAL - Abnormal; Notable for the following:    Neutrophils Relative 86 (*)    Neutro Abs 11.3 (*)    Lymphocytes Relative 10 (*)    All other components within normal limits  BASIC METABOLIC PANEL - Abnormal; Notable for the following:    Sodium 154 (*)    Chloride 116 (*)    Glucose, Bld 215 (*)    BUN 28 (*)    GFR calc non Af Amer 85 (*)    All other components within normal limits  URINALYSIS, ROUTINE W REFLEX MICROSCOPIC - Abnormal; Notable for the following:    Color, Urine AMBER (*) BIOCHEMICALS MAY BE AFFECTED BY COLOR   Bilirubin Urine SMALL (*)    Protein, ur 30 (*)    Urobilinogen, UA 2.0 (*)    All other components within normal limits  LACTIC ACID, PLASMA - Abnormal; Notable for the following:    Lactic Acid, Venous 2.3 (*)    All other components within normal limits  URINE MICROSCOPIC-ADD ON  URINE CULTURE  CULTURE, BLOOD (ROUTINE X 2)  CULTURE, BLOOD (ROUTINE X 2)   Dg Chest Port 1 View  11/04/2011  *RADIOLOGY REPORT*  Clinical Data: Fever, unresponsive  PORTABLE CHEST - 1 VIEW  Comparison: Chest radiograph 11-03-2011  Findings: Tracheostomy tube is unchanged.  Normal cardiac silhouette.  There is linear markings at the left and right lung base which are similar to prior to a slightly increased.  No evidence of pneumothorax.  No pulmonary edema.  IMPRESSION: Bibasilar atelectasis versus infiltrates may be slightly increased.  Original Report Authenticated By: Genevive Bi, M.D.     1. HCAP (healthcare-associated  pneumonia)   2. Fever   3. Sepsis       MDM  Sepsis secondary to help her acquire pneumonia. Blood cultures were sent, urine culture, sputum culture. Chest x-ray indicative of pneumonia. Is placed on broad spectrum antibiotics vancomycin and Zosyn. Will be admitted to the step down unit. Aggressive IV fluid hydration was performed as the patient is tachycardic however his blood pressure has remained stable.        Dayton Bailiff, MD 11/04/11 [phone]

## 2011-11-04 NOTE — H&P (Signed)
PCP:   Laurita Quint, MD, MD   Chief Complaint: Patient is in a vegetative state unable to offer any complaints.  Sent here for fever   HPI: Gregory Humphrey is an 70 y.o. male who unfortunately suffered a severe subarachnoid, subdural, parenchymal intracranial hemorrhage resulting in a persistent vegetative state requiring chronic tube feeding and tracheostomy, sent here from nursing home for fever. Reportedly, he has been coughing green sputum through his tracheostomy. Evaluation in emergency room included a chest x-ray which showed bi-basilar infiltrates.  He did have a temperature of 102 in emergency room and has leukocytosis with a white count of 13,000. Hospitalist was asked to admit him for healthcare acquired pneumonia.  Rewiew of Systems: Patient is unable to give any review of system.    Past Medical History  Diagnosis Date  . Hypertension   . Gilbert's syndrome   . Kidney stones   . Olecranon bursitis     Past Surgical History  Procedure Date  . Refractive surgery 1995  . Craniotomy 10/08/2011    Procedure: CRANIOTOMY HEMATOMA EVACUATION SUBDURAL;  Surgeon: Dorian Heckle, MD;  Location: [facility];  Service: Neurosurgery;  Laterality: Left;  Left Craniotomy for subdural  . Peg placement 10/16/2011    Procedure: PERCUTANEOUS ENDOSCOPIC GASTROSTOMY (PEG) PLACEMENT;  Surgeon: Erick Blinks, MD;  Location: [facility] ENDOSCOPY;  Service: Gastroenterology;  Laterality: N/A;    Medications:  HOME MEDS: Prior to Admission medications   Medication Sig Start Date End Date Taking? Authorizing Provider  acetaminophen (TYLENOL) 650 MG CR tablet Take 650 mg by mouth every 6 (six) hours as needed. For pain     Yes Historical Provider, MD  docusate (COLACE) 60 MG/15ML syrup Take 60 mg by mouth 2 (two) times daily.     Yes Historical Provider, MD  insulin aspart (NOVOLOG) 100 UNIT/ML injection Inject 3 Units into the skin every 4 (four) hours. Check CBG every 6 hours, if greater than 150  give 3 units.  10/24/11 10/23/12 Yes Anders Simmonds, NP  sennosides (SENOKOT) 8.8 MG/5ML syrup Place 5 mLs into feeding tube 2 (two) times daily.   10/24/11 11/03/11  Anders Simmonds, NP     Allergies:  No Known Allergies  Social History:   reports that he has never smoked. He has never used smokeless tobacco. He reports that he does not drink alcohol or use illicit drugs.  Family History: Family History  Problem Relation Age of Onset  . Stroke Mother   . Hypertension Mother   . Diabetes Mother   . Cancer Father     bone  . Alcohol abuse Brother     past  . Stroke Maternal Grandfather      Physical Exam: Filed Vitals:   11/04/11 2145 11/04/11 2200 11/04/11 2215 11/04/11 2244  BP: 130/78 125/74 139/78 129/73  Pulse: 108 107 105 107  Temp:      TempSrc:      Resp: 20 20 19 20   SpO2: 100% 100% 100% 100%   Blood pressure 129/73, pulse 107, temperature 102.6 F (39.2 C), temperature source Rectal, resp. rate 20, SpO2 100.00%.  GEN:  person lying in the stretcher in a vegetative state  PSYCH:  Unable HEENT: Mucous membranes pink and anicteric; PERRLA; EOM intact; no cervical lymphadenopathy nor thyromegaly or carotid bruit; no JVD; Breasts:: Not examined CHEST WALL: No tenderness CHEST: he has a tracheostomy, and breathing spontaneously HEART: Regular and tachycardic; no murmurs rubs or gallops BACK: No kyphosis or scoliosis; no CVA  tenderness ABDOMEN: Obese, soft non-tender; no masses, no organomegaly, normal abdominal bowel sounds; he has a PEG tube Rectal Exam: Not done EXTREMITIES: No bone or joint deformity; age-appropriate arthropathy of the hands and knees; no edema; no ulcerations. Genitalia: not examined PULSES: 2+ and symmetric SKIN: He has sacral decubitus Unable to fully evaluate   Labs & Imaging Results for orders placed during the hospital encounter of 11/04/11 (from the past 48 hour(s))  CBC     Status: Abnormal   Collection Time   11/04/11  9:04 PM       Component Value Range Comment   WBC 13.2 (*) 4.0 - 10.5 (K/uL)    RBC 4.23  4.22 - 5.81 (MIL/uL)    Hemoglobin 13.1  13.0 - 17.0 (g/dL)    HCT 40.9  81.1 - 91.4 (%)    MCV 95.5  78.0 - 100.0 (fL)    MCH 31.0  26.0 - 34.0 (pg)    MCHC 32.4  30.0 - 36.0 (g/dL)    RDW 78.2  95.6 - 21.3 (%)    Platelets 202  150 - 400 (K/uL)   DIFFERENTIAL     Status: Abnormal   Collection Time   11/04/11  9:04 PM      Component Value Range Comment   Neutrophils Relative 86 (*) 43 - 77 (%)    Neutro Abs 11.3 (*) 1.7 - 7.7 (K/uL)    Lymphocytes Relative 10 (*) 12 - 46 (%)    Lymphs Abs 1.3  0.7 - 4.0 (K/uL)    Monocytes Relative 4  3 - 12 (%)    Monocytes Absolute 0.5  0.1 - 1.0 (K/uL)    Eosinophils Relative 1  0 - 5 (%)    Eosinophils Absolute 0.1  0.0 - 0.7 (K/uL)    Basophils Relative 0  0 - 1 (%)    Basophils Absolute 0.0  0.0 - 0.1 (K/uL)   BASIC METABOLIC PANEL     Status: Abnormal   Collection Time   11/04/11  9:04 PM      Component Value Range Comment   Sodium 154 (*) 135 - 145 (mEq/L)    Potassium 4.0  3.5 - 5.1 (mEq/L)    Chloride 116 (*) 96 - 112 (mEq/L)    CO2 26  19 - 32 (mEq/L)    Glucose, Bld 215 (*) 70 - 99 (mg/dL)    BUN 28 (*) 6 - 23 (mg/dL)    Creatinine, Ser 0.86  0.50 - 1.35 (mg/dL)    Calcium 9.0  8.4 - 10.5 (mg/dL)    GFR calc non Af Amer 85 (*) >90 (mL/min)    GFR calc Af Amer >90  >90 (mL/min)   LACTIC ACID, PLASMA     Status: Abnormal   Collection Time   11/04/11  9:06 PM      Component Value Range Comment   Lactic Acid, Venous 2.3 (*) 0.5 - 2.2 (mmol/L)   URINALYSIS, ROUTINE W REFLEX MICROSCOPIC     Status: Abnormal   Collection Time   11/04/11  9:15 PM      Component Value Range Comment   Color, Urine AMBER (*) YELLOW  BIOCHEMICALS MAY BE AFFECTED BY COLOR   APPearance CLEAR  CLEAR     Specific Gravity, Urine 1.027  1.005 - 1.030     pH 5.0  5.0 - 8.0     Glucose, UA NEGATIVE  NEGATIVE (mg/dL)    Hgb urine dipstick NEGATIVE  NEGATIVE  Bilirubin Urine  SMALL (*) NEGATIVE     Ketones, ur NEGATIVE  NEGATIVE (mg/dL)    Protein, ur 30 (*) NEGATIVE (mg/dL)    Urobilinogen, UA 2.0 (*) 0.0 - 1.0 (mg/dL)    Nitrite NEGATIVE  NEGATIVE     Leukocytes, UA NEGATIVE  NEGATIVE    URINE MICROSCOPIC-ADD ON     Status: Normal   Collection Time   11/04/11  9:15 PM      Component Value Range Comment   WBC, UA 0-2  <3 (WBC/hpf)    RBC / HPF 0-2  <3 (RBC/hpf)    Bacteria, UA RARE  RARE     Urine-Other MUCOUS PRESENT      Dg Chest Port 1 View  11/04/2011  *RADIOLOGY REPORT*  Clinical Data: Fever, unresponsive  PORTABLE CHEST - 1 VIEW  Comparison: Chest radiograph 2011-11-03  Findings: Tracheostomy tube is unchanged.  Normal cardiac silhouette.  There is linear markings at the left and right lung base which are similar to prior to a slightly increased.  No evidence of pneumothorax.  No pulmonary edema.  IMPRESSION: Bibasilar atelectasis versus infiltrates may be slightly increased.  Original Report Authenticated By: Genevive Bi, M.D.      Assessment Present on Admission:  .PNA (pneumonia) .Persistent vegetative state .Chronic respiratory failure .CARBOHYDRATE METABOLISM DISORDER .Physical deconditioning   PLAN:  Gregory Humphrey unfortunately is in a vegetative state after a severe 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 now admitted for healthcare acquired pneumonia. Will continue his vancomycin and Zosyn. He'll be given Tylenol as an antipyretic.  I will continue his DO NOT RESUSCITATE CODE STATUS, and continue his tube feeding. For his diabetes, we'll continue his insulin sliding scale. His other medications will be continued as well. We'll admit him to the step down for closer monitoring under triad hospitalist service.   Other plans as per orders.    Gregory Humphrey 11/04/2011, 11:20 PM

## 2011-11-04 NOTE — ED Notes (Signed)
Attempt to call report. Nurse unavailable.

## 2011-11-04 NOTE — ED Notes (Signed)
To ED from nursing home via EMS for fever, diff breathing per family, pt has trach, EMS reports left hand swelling, pt at neuro baseline without communication and is at baseline, LS clear per EMS, NAD

## 2011-11-05 LAB — GLUCOSE, CAPILLARY

## 2011-11-05 LAB — CBC
Hemoglobin: 11.6 g/dL — ABNORMAL LOW (ref 13.0–17.0)
MCH: 30.4 pg (ref 26.0–34.0)
MCV: 96.6 fL (ref 78.0–100.0)
RBC: 3.81 MIL/uL — ABNORMAL LOW (ref 4.22–5.81)

## 2011-11-05 LAB — BASIC METABOLIC PANEL
BUN: 25 mg/dL — ABNORMAL HIGH (ref 6–23)
CO2: 27 mEq/L (ref 19–32)
Calcium: 8.8 mg/dL (ref 8.4–10.5)
Glucose, Bld: 176 mg/dL — ABNORMAL HIGH (ref 70–99)
Potassium: 3.9 mEq/L (ref 3.5–5.1)
Sodium: 155 mEq/L — ABNORMAL HIGH (ref 135–145)

## 2011-11-05 MED ORDER — VANCOMYCIN HCL 1000 MG IV SOLR
1250.0000 mg | Freq: Two times a day (BID) | INTRAVENOUS | Status: DC
  Administered 2011-11-05 – 2011-11-08 (×7): 1250 mg via INTRAVENOUS
  Filled 2011-11-05 (×8): qty 1250

## 2011-11-05 MED ORDER — SENNOSIDES 8.8 MG/5ML PO SYRP
5.0000 mL | ORAL_SOLUTION | Freq: Two times a day (BID) | ORAL | Status: DC
  Administered 2011-11-05: 5 mL
  Administered 2011-11-05: 11:00:00
  Administered 2011-11-06 – 2011-11-11 (×10): 5 mL
  Filled 2011-11-05 (×14): qty 5

## 2011-11-05 MED ORDER — ACETAMINOPHEN 650 MG RE SUPP: 325.0000 mg | Freq: Four times a day (QID) | RECTAL | Status: DC | PRN

## 2011-11-05 MED ORDER — PANTOPRAZOLE SODIUM 40 MG PO PACK
40.0000 mg | PACK | ORAL | Status: DC
  Filled 2011-11-05: qty 20

## 2011-11-05 MED ORDER — SODIUM CHLORIDE 0.45 % IV SOLN
INTRAVENOUS | Status: DC
  Administered 2011-11-05 – 2011-11-06 (×2): via INTRAVENOUS

## 2011-11-05 MED ORDER — ACETAMINOPHEN 325 MG PO TABS: 650.0000 mg | ORAL_TABLET | Freq: Four times a day (QID) | ORAL | Status: DC | PRN

## 2011-11-05 MED ORDER — INSULIN ASPART 100 UNIT/ML ~~LOC~~ SOLN
3.0000 [IU] | SUBCUTANEOUS | Status: DC
  Administered 2011-11-05 – 2011-11-11 (×37): 3 [IU] via SUBCUTANEOUS
  Filled 2011-11-05: qty 3

## 2011-11-05 MED ORDER — PIPERACILLIN-TAZOBACTAM 3.375 G IVPB
3.3750 g | Freq: Three times a day (TID) | INTRAVENOUS | Status: DC
  Administered 2011-11-06 – 2011-11-11 (×16): 3.375 g via INTRAVENOUS
  Filled 2011-11-05 (×18): qty 50

## 2011-11-05 MED ORDER — PROMOTE PO LIQD
1000.0000 mL | ORAL | Status: DC
  Filled 2011-11-05 (×3): qty 1000

## 2011-11-05 MED ORDER — ACETAMINOPHEN 160 MG/5ML PO SOLN: 650.0000 mg | Freq: Four times a day (QID) | ORAL | Status: DC | PRN

## 2011-11-05 MED ORDER — LEVETIRACETAM 100 MG/ML PO SOLN
750.0000 mg | Freq: Two times a day (BID) | ORAL | Status: DC
  Administered 2011-11-05 – 2011-11-11 (×14): 750 mg via ORAL
  Filled 2011-11-05 (×16): qty 7.5

## 2011-11-05 MED ORDER — DOCUSATE SODIUM 50 MG/5ML PO LIQD
60.0000 mg | Freq: Two times a day (BID) | ORAL | Status: DC
  Administered 2011-11-05: 11:00:00 via ORAL
  Filled 2011-11-05 (×2): qty 10

## 2011-11-05 MED ORDER — DOCUSATE SODIUM 50 MG/5ML PO LIQD
60.0000 mg | Freq: Two times a day (BID) | ORAL | Status: DC
  Administered 2011-11-05 – 2011-11-08 (×5): 60 mg
  Administered 2011-11-08: 11:00:00
  Administered 2011-11-09: 60 mg
  Administered 2011-11-09: 10:00:00
  Administered 2011-11-10: 60 mg
  Administered 2011-11-10: 11:00:00
  Administered 2011-11-11: 60 mg
  Filled 2011-11-05 (×14): qty 10

## 2011-11-05 MED ORDER — PIPERACILLIN-TAZOBACTAM 3.375 G IVPB 30 MIN: 3.3750 g | Freq: Three times a day (TID) | INTRAVENOUS | Status: DC

## 2011-11-05 MED ORDER — PROMOTE PO LIQD
1000.0000 mL | ORAL | Status: DC
  Administered 2011-11-05 – 2011-11-10 (×8): 1000 mL
  Filled 2011-11-05 (×14): qty 1000

## 2011-11-05 MED ORDER — COLLAGENASE 250 UNIT/GM EX OINT
1.0000 "application " | TOPICAL_OINTMENT | Freq: Every day | CUTANEOUS | Status: DC
  Administered 2011-11-05 – 2011-11-10 (×6): 1 via TOPICAL
  Filled 2011-11-05: qty 30

## 2011-11-05 MED ORDER — FREE WATER
200.0000 mL | Freq: Four times a day (QID) | Status: DC
  Administered 2011-11-05 – 2011-11-06 (×6): 200 mL
  Filled 2011-11-05 (×9): qty 200

## 2011-11-05 MED ORDER — SODIUM CHLORIDE 0.9 % IJ SOLN
10.0000 mL | Freq: Three times a day (TID) | INTRAMUSCULAR | Status: DC
  Administered 2011-11-05 – 2011-11-09 (×4): 10 mL
  Administered 2011-11-10: 3 mL
  Administered 2011-11-10 – 2011-11-11 (×2): 10 mL
  Filled 2011-11-05 (×4): qty 10

## 2011-11-05 MED ORDER — INSULIN GLARGINE 100 UNIT/ML ~~LOC~~ SOLN
5.0000 [IU] | Freq: Every day | SUBCUTANEOUS | Status: DC
  Administered 2011-11-05 – 2011-11-06 (×2): 5 [IU] via SUBCUTANEOUS
  Filled 2011-11-05: qty 3

## 2011-11-05 NOTE — Consult Note (Signed)
WOC consult Note Reason for Consult: Right buttock with stage 2 wound, present on admission.1X.5X.1cm, pink and dry Sacrum with unstageable wound, present on admission, 1X1X.1cm, 100%slough, small yellow drainage Dressing procedure/placement/frequency: Air mattress to reduce pressure.  Santyl ointment to chemically debride nonviable tissue.  Foam dressing to protect right buttock.   Cammie Mcgee, RN, MSN, [employer]  [phone]

## 2011-11-05 NOTE — Progress Notes (Signed)
Subjective: Gregory Humphrey is an 70 y.o. male who unfortunately suffered a severe subarachnoid, subdural, parenchymal intracranial hemorrhage resulting in a persistent vegetative state requiring chronic tube feeding and tracheostomy, sent here from nursing home for fever. Reportedly, he has been coughing green sputum through his tracheostomy. Evaluation in emergency room included a chest x-ray which showed bi-basilar infiltrates. He did have a temperature of 102 in emergency room and has leukocytosis with a white count of 13,000.  I have spoken with the patient's wife at the bedside extensively this morning.  She reports that his respirations appear to be much more comfortable.  The patient himself is noncommunicative and therefore unable to provide a history of his own.  The spouse does ask that we search for a different facility in that she was not happy with the prior skilled nursing facility in which her husband was residing.  Objective: Weight change:   Intake/Output Summary (Last 24 hours) at 11/05/11 1024 Last data filed at 11/05/11 0548  Gross per 24 hour  Intake    200 ml  Output    275 ml  Net    -75 ml   Blood pressure 120/74, pulse 98, temperature 98.7 F (37.1 C), temperature source Axillary, resp. rate 19, height 6' (1.829 m), weight 81.6 kg (179 lb 14.3 oz), SpO2 99.00%.  Physical Exam: General: No acute respiratory distress Lungs: Clear to auscultation bilaterally with exception to coarse crackles in bilateral bases-no wheezing Cardiovascular: Regular rate and rhythm without murmur gallop or rub normal S1 and S2 Abdomen: Nontender, nondistended, soft, bowel sounds positive, no rebound, no ascites, no appreciable mass-PEG tube insertion clean and dry Extremities: No significant cyanosis, clubbing, or edema bilateral lower extremities GU:  Foley catheter in place draining concentrated yellow urine  Lab Results:  Basename 11/05/11 0500 11/04/11 2104  NA 155* 154*  K 3.9  4.0  CL 120* 116*  CO2 27 26  GLUCOSE 176* 215*  BUN 25* 28*  CREATININE 0.77 0.90  CALCIUM 8.8 9.0  MG -- --  PHOS -- --    Basename 11/05/11 0500 11/04/11 2104  WBC 12.4* 13.2*  NEUTROABS -- 11.3*  HGB 11.6* 13.1  HCT 36.8* 40.4  MCV 96.6 95.5  PLT 159 202   Micro Results: Recent Results (from the past 240 hour(s))  MRSA PCR SCREENING     Status: Abnormal   Collection Time   11/05/11  3:35 AM      Component Value Range Status Comment   MRSA by PCR POSITIVE (*) NEGATIVE  Final    Assessment/Plan:  Healthcare acquired pneumonia Continue empiric IV antibiotic therapy-followup repeat chest x-ray in 48-72 hours  Persistent vegetative state after a severe 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  Status post neurosurgical evacuation 10/08/2011-PEG tube dependent-will begin search for a new facility in anticipation of discharge early next at wife's request  Chronic respiratory failure status post tracheostomy   a concern has been raised regarding the trach care the patient was previously receiving-it is possible this could have exposed the patient to a tracheobronchitis which ultimately led to his current pneumonia-we will provide careful trach care during his hospital stay  Hypertension Blood pressure is currently well-controlled-this is likely due to hypovolemia-follow with volume resuscitation  Gilbert's syndrome  Hyperglycemia Continue sliding scale insulin with CBGs every 4 hours  Hypernatremia This appears to be a hypovolemic hypernatremia-we will hydrate and follow  Normocytic anemia This is likely due to the patient's severe general illness and poor nutritional status-there  is no evidence of blood loss  Nursing home acquired decubitus ulceration As per the nursing staff the patient does have an area of breakdown on the sacrum and another questionable area of pending breakdown on his buttocks-I have requested a wound care consult to attend to these  lesions  MRSA positive  DNR   LOS: 1 day  11/05/2011, 10:24 AM  Lonia Blood, MD [facility] Office  [phone] Pager [phone]  On-Call/Text Page:      [email or web address]      password [facility]

## 2011-11-05 NOTE — Progress Notes (Addendum)
INITIAL ADULT NUTRITION ASSESSMENT Date: 11/05/2011   Time: 9:22 AM  Reason for Assessment: new TF  ASSESSMENT: Male 70 y.o.  Dx: PNA (pneumonia)  Hx:  Past Medical History  Diagnosis Date  . Hypertension   . Gilbert's syndrome   . Kidney stones   . Olecranon bursitis    Related Meds:     . acetaminophen  325 mg Rectal Once  . docusate  60 mg Oral BID  . free water  200 mL Per Tube Q6H  . insulin aspart  3 Units Subcutaneous Q4H  . insulin glargine  5 Units Subcutaneous QHS  . levETIRAcetam  750 mg Oral Q12H  . pantoprazole sodium  40 mg Per Tube Q24H  . piperacillin-tazobactam (ZOSYN)  IV  3.375 g Intravenous Once  . piperacillin-tazobactam (ZOSYN)  IV  3.375 g Intravenous Q8H  . sennosides  5 mL Per Tube BID  . sodium chloride  1,000 mL Intravenous Once  . sodium chloride  1,000 mL Intravenous Once  . sodium chloride  10 mL Intracatheter Q8H  . vancomycin  1,250 mg Intravenous Q12H  . vancomycin  1,000 mg Intravenous Once  . DISCONTD: piperacillin-tazobactam  3.375 g Intravenous Q8H   Ht: 6' (182.9 cm)  Wt: 179 lb 14.3 oz (81.6 kg)  Ideal Wt:  80.9 kg % Ideal Wt: 101%  Usual Wt: 91.6 kg on 09/18/11 office visit % Usual Wt: 89%  Body mass index is 24.40 kg/(m^2). Weight is WNL.  Food/Nutrition Related Hx: PEG placed 12/6  Labs:  CMP     Component Value Date/Time   NA 155* 11/05/2011 0500   K 3.9 11/05/2011 0500   CL 120* 11/05/2011 0500   CO2 27 11/05/2011 0500   GLUCOSE 176* 11/05/2011 0500   BUN 25* 11/05/2011 0500   CREATININE 0.77 11/05/2011 0500   CALCIUM 8.8 11/05/2011 0500   PROT 7.0 10-Nov-2011 1723   ALBUMIN 2.3* Nov 10, 2011 1723   AST 54* 10-Nov-2011 1723   ALT 132* 10-Nov-2011 1723   ALKPHOS 80 11-10-11 1723   BILITOT 0.6 2011/11/10 1723   GFRNONAA >90 11/05/2011 0500   GFRAA >90 11/05/2011 0500   Intake/Output: I/O last 3 completed shifts: In: 200 [Other:200] Out: 275 [Urine:275]    Diet Order: NPO  Supplements/Tube Feeding:  Promote (continuous)  IVF:    feeding supplement (PROMOTE)    Estimated Nutritional Needs:   Kcal: 1450 - 1650 kcal Protein:  75 - 95 g Fluid:  2 - 2.2 L/d  Pt in vegetative state s/p severe intracranial hemorrhage at end of November. PEG placed 12/6. Current water flushes at 200 ml QID. Per wife, pt receives Promote at 50 ml/hr at nursing home. This provides: 1200 kcal, 76 g protein, 1007 ml free water. Pt with 11% wt loss x 2 months. Pt meets criteria for moderate malnutrition in the context of acute illness/injury.  NUTRITION DIAGNOSIS: -Inadequate oral intake (NI-2.1).  Status: Ongoing  RELATED TO: inability to eat  AS EVIDENCE BY: NPO status, vegetative state.  MONITORING/EVALUATION(Goals): Goal: TF to provide >/= 90% of estimated needs. Unmet at this time Monitor: TF tolerance/adequacy, weights, labs, I/O's  EDUCATION NEEDS: -No education needs identified at this time  INTERVENTION: 1. To better meet nutritional needs, recommend Promote at 60 ml/hr via PEG. Initiate at 20 ml/hr, advance by 10 ml/hr every 4 hours or as tolerated to goal of 60 ml/hr. Goal TF will provide: 1440 kcal, 90 g protein, 1208 ml free water. 3. RD to follow nutrition care plan  Dietitian #: [phone]  DOCUMENTATION CODES Per approved criteria  -Non-severe (moderate) malnutrition in the context of acute illness or injury    Adair Laundry 11/05/2011, 9:22 AM

## 2011-11-05 NOTE — Progress Notes (Signed)
ANTIBIOTIC CONSULT NOTE - INITIAL  Pharmacy Consult for Vancomycin Indication: HCAP  No Known Allergies  Patient Measurements: Weight: 203 lb (92.08 kg) Adjusted Body Weight:   Vital Signs: Temp: 102.6 F (39.2 C) (12/25 2115) Temp src: Rectal (12/25 2115) BP: 129/73 mmHg (12/25 2244) Pulse Rate: 107  (12/25 2244) Intake/Output from previous day:   Intake/Output from this shift:    Labs:  [facility] 11/04/11 2104  WBC 13.2*  HGB 13.1  PLT 202  LABCREA --  CREATININE 0.90   The CrCl is unknown because both a height and weight (above a minimum accepted value) are required for this calculation. No results found for this basename: VANCOTROUGH:2,VANCOPEAK:2,VANCORANDOM:2,GENTTROUGH:2,GENTPEAK:2,GENTRANDOM:2,TOBRATROUGH:2,TOBRAPEAK:2,TOBRARND:2,AMIKACINPEAK:2,AMIKACINTROU:2,AMIKACIN:2, in the last 72 hours   Microbiology: Recent Results (from the past 720 hour(s))  MRSA PCR SCREENING     Status: Normal   Collection Time   10/07/11 10:16 PM      Component Value Range Status Comment   MRSA by PCR NEGATIVE  NEGATIVE  Final   CULTURE, RESPIRATORY     Status: Normal   Collection Time   10/08/11  8:45 PM      Component Value Range Status Comment   Specimen Description ENDOTRACHEAL   Final    Special Requests NONE   Final    Gram Stain     Final    Value: ABUNDANT WBC PRESENT, PREDOMINANTLY PMN     NO SQUAMOUS EPITHELIAL CELLS SEEN     ABUNDANT GRAM POSITIVE COCCI     IN PAIRS   Culture     Final    Value: MODERATE STREPTOCOCCUS PNEUMONIAE     Note: PENICILLIN SENSITIVE   Report Status 10/12/2011 FINAL   Final   CULTURE, BLOOD (ROUTINE X 2)     Status: Normal   Collection Time   10/10/11  6:41 PM      Component Value Range Status Comment   Specimen Description BLOOD LEFT FOREARM   Final    Special Requests BOTTLES DRAWN AEROBIC AND ANAEROBIC 10CC EACH   Final    Setup Time [id number]   Final    Culture NO GROWTH 5 DAYS   Final    Report Status 10/17/2011 FINAL    Final   CULTURE, BLOOD (ROUTINE X 2)     Status: Normal   Collection Time   10/10/11  6:50 PM      Component Value Range Status Comment   Specimen Description BLOOD LEFT FOREARM   Final    Special Requests BOTTLES DRAWN AEROBIC ONLY 10CC   Final    Setup Time [id number]   Final    Culture NO GROWTH 5 DAYS   Final    Report Status 10/17/2011 FINAL   Final   CULTURE, RESPIRATORY     Status: Normal   Collection Time   10/21/11  5:07 PM      Component Value Range Status Comment   Specimen Description TRACHEAL ASPIRATE   Final    Special Requests NONE   Final    Gram Stain     Final    Value: ABUNDANT WBC PRESENT,BOTH PMN AND MONONUCLEAR     FEW SQUAMOUS EPITHELIAL CELLS PRESENT     ABUNDANT GRAM POSITIVE COCCI     IN PAIRS IN CLUSTERS   Culture MODERATE STREPTOCOCCUS GROUP F   Final    Report Status 10/24/2011 FINAL   Final   CULTURE, BLOOD (ROUTINE X 2)     Status: Normal   Collection Time   2011-11-20  5:23  PM      Component Value Range Status Comment   Specimen Description BLOOD LEFT HAND   Final    Special Requests BOTTLES DRAWN AEROBIC AND ANAEROBIC 10CC   Final    Setup Time [id number]   Final    Culture NO GROWTH 5 DAYS   Final    Report Status 11/01/2011 FINAL   Final   URINE CULTURE     Status: Normal   Collection Time   11-10-2011  5:34 PM      Component Value Range Status Comment   Specimen Description URINE, CATHETERIZED   Final    Special Requests NONE   Final    Setup Time [id number]   Final    Colony Count 2,000 COLONIES/ML   Final    Culture ENTEROCOCCUS SPECIES   Final    Report Status 10/28/2011 FINAL   Final    Organism ID, Bacteria ENTEROCOCCUS SPECIES   Final   CULTURE, BLOOD (ROUTINE X 2)     Status: Normal   Collection Time   10-Nov-2011  5:38 PM      Component Value Range Status Comment   Specimen Description BLOOD LEFT ARM   Final    Special Requests BOTTLES DRAWN AEROBIC ONLY 10CC   Final    Setup Time [id number]   Final    Culture NO GROWTH 5  DAYS   Final    Report Status 11/01/2011 FINAL   Final     Medical History: Past Medical History  Diagnosis Date  . Hypertension   . Humphrey's syndrome   . Kidney stones   . Olecranon bursitis     Medications:  Prescriptions prior to admission  Medication Sig Dispense Refill  . acetaminophen (TYLENOL) 650 MG CR tablet Take 650 mg by mouth every 6 (six) hours as needed. For pain        . docusate (COLACE) 60 MG/15ML syrup Take 60 mg by mouth 2 (two) times daily.        . insulin aspart (NOVOLOG) 100 UNIT/ML injection Inject 3 Units into the skin every 4 (four) hours. Check CBG every 6 hours, if greater than 150 give 3 units.        Assessment: 11 yom with recent ICH and unfortunately is in a vegetative state. Patient admitted with fevers and secretions from trach, will start IV vancomycin for possible HCAP/sepsis. Will also change zosyn to the 4 hour infusion based on renal function and hospital policy.    Goal of Therapy:  Vancomycin trough level 15-20 mcg/ml  Plan:  Vancomycin 1250mg  IV q 12 hours Zosyn 3.375 grams q 8 hrs Follow cxs and renal function as appropriate Vancomycin trough as indicated  Gregory Humphrey 11/05/2011,1:42 AM

## 2011-11-06 LAB — CBC
Platelets: 159 10*3/uL (ref 150–400)
RDW: 13.5 % (ref 11.5–15.5)
WBC: 9.7 10*3/uL (ref 4.0–10.5)

## 2011-11-06 LAB — GLUCOSE, CAPILLARY
Glucose-Capillary: 173 mg/dL — ABNORMAL HIGH (ref 70–99)
Glucose-Capillary: 163 mg/dL — ABNORMAL HIGH (ref 70–99)

## 2011-11-06 LAB — BASIC METABOLIC PANEL
Calcium: 8.9 mg/dL (ref 8.4–10.5)
Creatinine, Ser: 0.68 mg/dL (ref 0.50–1.35)
GFR calc Af Amer: >90 mL/min (ref >90)
GFR calc non Af Amer: >90 mL/min (ref >90)

## 2011-11-06 LAB — URINE CULTURE
Colony Count: NO GROWTH
Culture  Setup Time: 201212252202

## 2011-11-06 MED ORDER — FREE WATER
250.0000 mL | Freq: Four times a day (QID) | Status: DC
  Administered 2011-11-06 – 2011-11-11 (×19): 250 mL
  Filled 2011-11-06 (×23): qty 250

## 2011-11-06 NOTE — Progress Notes (Signed)
Subjective: Gregory Humphrey is an 70 y.o. male who unfortunately suffered a severe subarachnoid, subdural, parenchymal intracranial hemorrhage resulting in a persistent vegetative state requiring chronic tube feeding and tracheostomy, sent here from nursing home for fever. Reportedly, he has been coughing green sputum through his tracheostomy. Evaluation in emergency room included a chest x-ray which showed bi-basilar infiltrates. He did have a temperature of 102 in emergency room and has leukocytosis with a white count of 13,000.  There is no family present at the time of my visit today.  The patient is noncommunicative. There are no signs of acute distress appreciable.  Objective: Weight change: -7.48 kg (-16 lb 7.9 oz)  Intake/Output Summary (Last 24 hours) at 11/06/11 1354 Last data filed at 11/06/11 1200  Gross per 24 hour  Intake 3243.33 ml  Output   1325 ml  Net 1918.33 ml   Blood pressure 128/69, pulse 92, temperature 98.6 F (37 C), temperature source Axillary, resp. rate 18, height 6' (1.829 m), weight 84.6 kg (186 lb 8.2 oz), SpO2 97.00%.  Physical Exam: General: No acute respiratory distress Lungs: Clear to auscultation bilaterally with exception to coarse crackles in bilateral bases and coarse upper airway sounds-no wheezing Cardiovascular: Regular rate and rhythm without murmur gallop or rub normal S1 and S2 Abdomen: Nontender, nondistended, soft, bowel sounds positive, no rebound, no ascites, no appreciable mass-PEG tube insertion clean and dry Extremities: No significant cyanosis, clubbing, or edema bilateral lower extremities GU:  Foley catheter in place draining concentrated yellow urine  Lab Results:  Basename 11/06/11 0350 11/05/11 0500 11/04/11 2104  NA 153* 155* 154*  K 3.4* 3.9 4.0  CL 117* 120* 116*  CO2 27 27 26   GLUCOSE 166* 176* 215*  BUN 27* 25* 28*  CREATININE 0.68 0.77 0.90  CALCIUM 8.9 8.8 9.0  MG -- -- --  PHOS -- -- --    Basename 11/06/11  0350 11/05/11 0500 11/04/11 2104  WBC 9.7 12.4* 13.2*  NEUTROABS -- -- 11.3*  HGB 10.8* 11.6* 13.1  HCT 33.8* 36.8* 40.4  MCV 95.8 96.6 95.5  PLT 159 159 202   Micro Results: Recent Results (from the past 240 hour(s))  CULTURE, BLOOD (ROUTINE X 2)     Status: Normal (Preliminary result)   Collection Time   11/04/11  9:00 PM      Component Value Range Status Comment   Specimen Description BLOOD ARM RIGHT   Final    Special Requests BOTTLES DRAWN AEROBIC AND ANAEROBIC 10CCB,5CCR   Final    Setup Time [id number]   Final    Culture     Final    Value:        BLOOD CULTURE RECEIVED NO GROWTH TO DATE CULTURE WILL BE HELD FOR 5 DAYS BEFORE ISSUING A FINAL NEGATIVE REPORT   Report Status PENDING   Incomplete   URINE CULTURE     Status: Normal   Collection Time   11/04/11  9:15 PM      Component Value Range Status Comment   Specimen Description URINE, CATHETERIZED   Final    Special Requests NONE   Final    Setup Time [id number]   Final    Colony Count NO GROWTH   Final    Culture NO GROWTH   Final    Report Status 11/06/2011 FINAL   Final   CULTURE, BLOOD (ROUTINE X 2)     Status: Normal (Preliminary result)   Collection Time   11/04/11  9:15 PM  Component Value Range Status Comment   Specimen Description BLOOD HAND LEFT   Final    Special Requests BOTTLES DRAWN AEROBIC ONLY 10CC   Final    Setup Time [id number]   Final    Culture     Final    Value:        BLOOD CULTURE RECEIVED NO GROWTH TO DATE CULTURE WILL BE HELD FOR 5 DAYS BEFORE ISSUING A FINAL NEGATIVE REPORT   Report Status PENDING   Incomplete   MRSA PCR SCREENING     Status: Abnormal   Collection Time   11/05/11  3:35 AM      Component Value Range Status Comment   MRSA by PCR POSITIVE (*) NEGATIVE  Final    Assessment/Plan:  Healthcare acquired pneumonia Continue empiric IV antibiotic therapy-followup repeat chest x-ray in a.m.  Persistent vegetative state after a severe intracranial hemorrhage from an  AVM rupture  Status post neurosurgical evacuation 10/08/2011-PEG tube dependent-have begun a search for a new facility in anticipation of discharge early next at wife's request  Chronic respiratory failure status post tracheostomy   a concern has been raised regarding the trach care the patient was previously receiving at his skilled nursing facility-it is possible this could have exposed the patient to a tracheobronchitis which ultimately led to his current pneumonia-we will continue to provide careful trach care during his hospital stay  Hypertension Blood pressure is currently well-controlled  Gilbert's syndrome  Hyperglycemia Continue sliding scale insulin with CBGs every 4 hours  Hypernatremia This appears to be a hypovolemic hypernatremia-we will continue to hydrate and follow  Normocytic anemia This is likely due to the patient's severe general illness and poor nutritional status-there is no evidence of blood loss  Nursing home acquired decubitus ulceration As per the nursing staff the patient does have an area of breakdown on the sacrum and another questionable area of pending breakdown on his buttocks-we will continue to follow wound care as recommended treatment plan  MRSA positive  DNR   LOS: 2 days  11/06/2011, 1:54 PM  Lonia Blood, MD [facility] Office  [phone] Pager [phone]  On-Call/Text Page:      [email or web address]      password [facility]

## 2011-11-06 NOTE — Progress Notes (Signed)
.  Clinical social worker completed patient psychosocial assessment, please see assessment in patient shadow chart. Pt is currently unresponsive, unarousable, and unable to engage in conversation.  Pt spouse requested a new skilled nursing facility search and does not wish for pt to return to previous skilled nursing facility. .Clinical social worker initiated skilled nursing facility search, see placement note in patient shadow chart.  CSW will be completing FL2 for MD signature and initiating skilled nursing facility search in [place] and [place]. .Clinical social worker continuing to follow pt to assist with pt dc plans and further csw needs.   Catha Gosselin, Theresia Majors  [phone] .11/06/2011 12:11pm

## 2011-11-06 NOTE — Clinical Documentation Improvement (Signed)
GENERIC DOCUMENTATION CLARIFICATION QUERY  THIS DOCUMENT IS NOT A PERMANENT PART OF THE MEDICAL RECORD  TO RESPOND TO THE THIS QUERY, FOLLOW THE INSTRUCTIONS BELOW:  1. If needed, update documentation for the patient's encounter via the notes activity.  2. Access this query again and click edit on the In [employer].  3. After updating, or not, click F2 to complete all highlighted (required) fields concerning your review. Select "additional documentation in the medical record" OR "no additional documentation provided".  4. Click Sign note button.  5. The deficiency will fall out of your In Basket *Please let us know if you are not able to complete this workflow by phone or e-mail (listed below).  Please update your documentation within the medical record to reflect your response to this query.                                                                                        11/06/11   Dear Dr. Maretta Bees / Associates,  In a better effort to capture your patient's severity of illness, reflect appropriate length of stay and utilization of resources, a review of the patient medical record has revealed the following indicators.    Based on your clinical judgment, please clarify and document in a progress note and/or discharge summary the clinical condition associated with the following supporting information:  In responding to this query please exercise your independent judgment.  The fact that a query is asked, does not imply that any particular answer is desired or expected.  Per initial ED MD review stating patient in "respiratory distress ( tachypneic) per admitting MD documentation "no acute respiratory distress"  Please clarify if there was no distress on admit or if it has resolved at time of your review.  Thank you   Possible Clinical Conditions?  Acute respiratory distress resolved   Acute respiratory distress POA  Acute respiratory distress never present  Other  Condition__________________  Cannot Clinically Determine   Supporting Information:  Risk Factors: Trach, persistent vegetative state   Signs & Symptoms: "coughing up green sputum"     Diagnostics: chest xray:  IMPRESSION: Bibasilar atelectasis versus infiltrates may be slightly increased.    Treatment: non re-breather mask 15 L on admit , then trach collar applied   You may use possible, probable, or suspect with inpatient documentation. possible, probable, suspected diagnoses MUST be documented at the time of discharge  Reviewed: NO clarification of query....patient discharged   Thank You,  Leonette Most Audris Speaker  Clinical Documentation Specialist RN, BSN:  Pager [phone]  Health Information Management [place]

## 2011-11-07 ENCOUNTER — Inpatient Hospital Stay (HOSPITAL_COMMUNITY): Payer: Medicare Other

## 2011-11-07 LAB — CBC
HCT: 30.8 % — ABNORMAL LOW (ref 39.0–52.0)
Hemoglobin: 9.8 g/dL — ABNORMAL LOW (ref 13.0–17.0)
MCH: 29.6 pg (ref 26.0–34.0)
MCHC: 31.8 g/dL (ref 30.0–36.0)
RDW: 13.3 % (ref 11.5–15.5)

## 2011-11-07 LAB — GLUCOSE, CAPILLARY
Glucose-Capillary: 144 mg/dL — ABNORMAL HIGH (ref 70–99)
Glucose-Capillary: 156 mg/dL — ABNORMAL HIGH (ref 70–99)
Glucose-Capillary: 182 mg/dL — ABNORMAL HIGH (ref 70–99)

## 2011-11-07 LAB — BASIC METABOLIC PANEL
BUN: 22 mg/dL (ref 6–23)
Calcium: 8.4 mg/dL (ref 8.4–10.5)
GFR calc Af Amer: >90 mL/min (ref >90)
GFR calc non Af Amer: >90 mL/min (ref >90)
Glucose, Bld: 177 mg/dL — ABNORMAL HIGH (ref 70–99)
Sodium: 150 mEq/L — ABNORMAL HIGH (ref 135–145)

## 2011-11-07 MED ORDER — POTASSIUM CHLORIDE 20 MEQ/15ML (10%) PO LIQD
20.0000 meq | Freq: Two times a day (BID) | ORAL | Status: DC
  Administered 2011-11-07 – 2011-11-11 (×8): 20 meq
  Filled 2011-11-07 (×10): qty 15

## 2011-11-07 MED ORDER — POTASSIUM CHLORIDE IN NACL 20-0.45 MEQ/L-% IV SOLN
INTRAVENOUS | Status: DC
  Administered 2011-11-07 – 2011-11-08 (×2): via INTRAVENOUS
  Filled 2011-11-07 (×4): qty 1000

## 2011-11-07 MED ORDER — INSULIN GLARGINE 100 UNIT/ML ~~LOC~~ SOLN
10.0000 [IU] | Freq: Every day | SUBCUTANEOUS | Status: DC
  Administered 2011-11-07 – 2011-11-10 (×4): 10 [IU] via SUBCUTANEOUS
  Filled 2011-11-07: qty 3

## 2011-11-07 NOTE — Progress Notes (Signed)
Subjective: Gregory Humphrey is an 70 y.o. male who unfortunately suffered a severe subarachnoid, subdural, parenchymal intracranial hemorrhage resulting in a persistent vegetative state requiring chronic tube feeding and tracheostomy, sent here from nursing home for fever. Reportedly, he has been coughing green sputum through his tracheostomy. Evaluation in emergency room included a chest x-ray which showed bi-basilar infiltrates. He did have a temperature of 102 in emergency room and has leukocytosis with a white count of 13,000.  There is no family present at the time of my visit today.  The patient is noncommunicative. There are no signs of acute distress appreciable.  He is opening his right eye today and making apparently random movements of his right arm and leg.  Objective: Weight change: 0.7 kg (1 lb 8.7 oz)  Intake/Output Summary (Last 24 hours) at 11/07/11 1556 Last data filed at 11/07/11 1400  Gross per 24 hour  Intake   3240 ml  Output   1175 ml  Net   2065 ml   Blood pressure 133/64, pulse 76, temperature 98.2 F (36.8 C), temperature source Oral, resp. rate 20, height 6' (1.829 m), weight 85.3 kg (188 lb 0.8 oz), SpO2 98.00%.  Physical Exam: General: No acute respiratory distress Lungs: Clear to auscultation bilaterally- bases are now clear and upper airway sounds are much improved Cardiovascular: Regular rate and rhythm without murmur gallop or rub normal S1 and S2 Abdomen: Nontender, nondistended, soft, bowel sounds positive, no rebound, no ascites, no appreciable mass-PEG tube insertion clean and dry Extremities: No significant cyanosis, clubbing, or edema bilateral lower extremities GU:  Foley catheter in place draining concentrated yellow urine  Lab Results:  Basename 11/07/11 0600 11/06/11 0350 11/05/11 0500  NA 150* 153* 155*  K 3.0* 3.4* 3.9  CL 115* 117* 120*  CO2 27 27 27   GLUCOSE 177* 166* 176*  BUN 22 27* 25*  CREATININE 0.65 0.68 0.77  CALCIUM 8.4 8.9  8.8  MG -- -- --  PHOS -- -- --    Basename 11/07/11 0600 11/06/11 0350 11/05/11 0500 11/04/11 2104  WBC 6.7 9.7 12.4* --  NEUTROABS -- -- -- 11.3*  HGB 9.8* 10.8* 11.6* --  HCT 30.8* 33.8* 36.8* --  MCV 93.1 95.8 96.6 --  PLT 131* 159 159 --   Micro Results: Recent Results (from the past 240 hour(s))  CULTURE, BLOOD (ROUTINE X 2)     Status: Normal (Preliminary result)   Collection Time   11/04/11  9:00 PM      Component Value Range Status Comment   Specimen Description BLOOD ARM RIGHT   Final    Special Requests BOTTLES DRAWN AEROBIC AND ANAEROBIC 10CCB,5CCR   Final    Setup Time [id number]   Final    Culture     Final    Value:        BLOOD CULTURE RECEIVED NO GROWTH TO DATE CULTURE WILL BE HELD FOR 5 DAYS BEFORE ISSUING A FINAL NEGATIVE REPORT   Report Status PENDING   Incomplete   URINE CULTURE     Status: Normal   Collection Time   11/04/11  9:15 PM      Component Value Range Status Comment   Specimen Description URINE, CATHETERIZED   Final    Special Requests NONE   Final    Setup Time [id number]   Final    Colony Count NO GROWTH   Final    Culture NO GROWTH   Final    Report Status 11/06/2011 FINAL  Final   CULTURE, BLOOD (ROUTINE X 2)     Status: Normal (Preliminary result)   Collection Time   11/04/11  9:15 PM      Component Value Range Status Comment   Specimen Description BLOOD HAND LEFT   Final    Special Requests BOTTLES DRAWN AEROBIC ONLY 10CC   Final    Setup Time [id number]   Final    Culture     Final    Value:        BLOOD CULTURE RECEIVED NO GROWTH TO DATE CULTURE WILL BE HELD FOR 5 DAYS BEFORE ISSUING A FINAL NEGATIVE REPORT   Report Status PENDING   Incomplete   MRSA PCR SCREENING     Status: Abnormal   Collection Time   11/05/11  3:35 AM      Component Value Range Status Comment   MRSA by PCR POSITIVE (*) NEGATIVE  Final    Assessment/Plan:  Healthcare acquired pneumonia vs acute tracheobronchitis Continue empiric IV antibiotic  therapy-followup repeat chest x-ray suggest no focal infiltrate raising the question of whether his acute infection was more of a tracheobronchitis-clinically he is much improved-we will complete a seven-day course of antibiotic therapy  Persistent vegetative state after a severe 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  Status post neurosurgical evacuation 10/08/2011-PEG tube dependent-we have begun a search for a new facility (at wife's request) in anticipation of discharge early next week  Chronic respiratory failure status post tracheostomy  a concern has been raised regarding the trach care the patient was previously receiving at his skilled nursing facility-it is possible this could have exposed the patient to a tracheobronchitis-clinically he is improving-trach care continue  Hypertension Blood pressure is currently well-controlled  Hypokalemia Replete and follow  Gilbert's syndrome  Hyperglycemia Continue sliding scale insulin with CBGs every 4 hours  Hypernatremia This appears to be a hypovolemic hypernatremia-is slowly improving with volume resuscitation-we will continue to follow  Normocytic anemia This is likely due to the patient's severe general illness and poor nutritional status-there is no evidence of blood loss-the current decrease in hemoglobin appears to be due to dilution-we will recheck in the morning  Nursing home acquired decubitus ulceration As per the nursing staff the patient does have an area of breakdown on the sacrum and another questionable area of pending breakdown on his buttocks-we will continue to follow wound care as recommended treatment plan  MRSA positive  DNR   LOS: 3 days  11/07/2011, 3:56 PM  Lonia Blood, MD [facility] Office  [phone] Pager [phone]  On-Call/Text Page:      [email or web address]      password [facility]

## 2011-11-07 NOTE — Progress Notes (Deleted)
Subjective: Gregory Humphrey is an 70 y.o. male who unfortunately suffered a severe subarachnoid, subdural, parenchymal intracranial hemorrhage resulting in a persistent vegetative state requiring chronic tube feeding and tracheostomy, sent here from nursing home for fever. Reportedly, he has been coughing green sputum through his tracheostomy. Evaluation in emergency room included a chest x-ray which showed bi-basilar infiltrates. He did have a temperature of 102 in emergency room and has leukocytosis with a white count of 13,000.  There is no family present at the time of my visit today.  The patient is noncommunicative. There are no signs of acute distress appreciable.  He is opening his right eye today and making apparently random movements of his right arm and leg.  Objective: Weight change: 0.7 kg (1 lb 8.7 oz)  Intake/Output Summary (Last 24 hours) at 11/07/11 1532 Last data filed at 11/07/11 1400  Gross per 24 hour  Intake   3240 ml  Output   1175 ml  Net   2065 ml   Blood pressure 135/56, pulse 77, temperature 98.2 F (36.8 C), temperature source Oral, resp. rate 23, height 6' (1.829 m), weight 85.3 kg (188 lb 0.8 oz), SpO2 97.00%.  Physical Exam: General: No acute respiratory distress Lungs: Clear to auscultation bilaterally- bases are now clear and upper airway sounds are much improved Cardiovascular: Regular rate and rhythm without murmur gallop or rub normal S1 and S2 Abdomen: Nontender, nondistended, soft, bowel sounds positive, no rebound, no ascites, no appreciable mass-PEG tube insertion clean and dry Extremities: No significant cyanosis, clubbing, or edema bilateral lower extremities GU:  Foley catheter in place draining concentrated yellow urine  Lab Results:  Basename 11/07/11 0600 11/06/11 0350 11/05/11 0500  NA 150* 153* 155*  K 3.0* 3.4* 3.9  CL 115* 117* 120*  CO2 27 27 27   GLUCOSE 177* 166* 176*  BUN 22 27* 25*  CREATININE 0.65 0.68 0.77  CALCIUM 8.4 8.9  8.8  MG -- -- --  PHOS -- -- --    Basename 11/07/11 0600 11/06/11 0350 11/05/11 0500 11/04/11 2104  WBC 6.7 9.7 12.4* --  NEUTROABS -- -- -- 11.3*  HGB 9.8* 10.8* 11.6* --  HCT 30.8* 33.8* 36.8* --  MCV 93.1 95.8 96.6 --  PLT 131* 159 159 --   Micro Results: Recent Results (from the past 240 hour(s))  CULTURE, BLOOD (ROUTINE X 2)     Status: Normal (Preliminary result)   Collection Time   11/04/11  9:00 PM      Component Value Range Status Comment   Specimen Description BLOOD ARM RIGHT   Final    Special Requests BOTTLES DRAWN AEROBIC AND ANAEROBIC 10CCB,5CCR   Final    Setup Time [id number]   Final    Culture     Final    Value:        BLOOD CULTURE RECEIVED NO GROWTH TO DATE CULTURE WILL BE HELD FOR 5 DAYS BEFORE ISSUING A FINAL NEGATIVE REPORT   Report Status PENDING   Incomplete   URINE CULTURE     Status: Normal   Collection Time   11/04/11  9:15 PM      Component Value Range Status Comment   Specimen Description URINE, CATHETERIZED   Final    Special Requests NONE   Final    Setup Time [id number]   Final    Colony Count NO GROWTH   Final    Culture NO GROWTH   Final    Report Status 11/06/2011 FINAL  Final   CULTURE, BLOOD (ROUTINE X 2)     Status: Normal (Preliminary result)   Collection Time   11/04/11  9:15 PM      Component Value Range Status Comment   Specimen Description BLOOD HAND LEFT   Final    Special Requests BOTTLES DRAWN AEROBIC ONLY 10CC   Final    Setup Time [id number]   Final    Culture     Final    Value:        BLOOD CULTURE RECEIVED NO GROWTH TO DATE CULTURE WILL BE HELD FOR 5 DAYS BEFORE ISSUING A FINAL NEGATIVE REPORT   Report Status PENDING   Incomplete   MRSA PCR SCREENING     Status: Abnormal   Collection Time   11/05/11  3:35 AM      Component Value Range Status Comment   MRSA by PCR POSITIVE (*) NEGATIVE  Final    Assessment/Plan:  Healthcare acquired pneumonia vs acute tracheobronchitis Continue empiric IV antibiotic  therapy-followup repeat chest x-ray suggest no focal infiltrate raising the question of whether his acute infection was more of a tracheobronchitis-clinically he is much improved-we will complete a seven-day course of antibiotic therapy  Persistent vegetative state after a severe 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  Status post neurosurgical evacuation 10/08/2011-PEG tube dependent-have begun a search for a new facility (at wife's request) in anticipation of discharge early next   Chronic respiratory failure status post tracheostomy  a concern has been raised regarding the trach care the patient was previously receiving at his skilled nursing facility-it is possible this could have exposed the patient to a tracheobronchitis-clinically he is improving-trach care continue  Hypertension Blood pressure is currently well-controlled  Hypokalemia Replete and follow  Gilbert's syndrome  Hyperglycemia Continue sliding scale insulin with CBGs every 4 hours  Hypernatremia This appears to be a hypovolemic hypernatremia-is slowly improving with volume resuscitation-we will continue to follow  Normocytic anemia This is likely due to the patient's severe general illness and poor nutritional status-there is no evidence of blood loss-the current decrease in hemoglobin appears to be due to dilution-we will recheck in the morning  Nursing home acquired decubitus ulceration As per the nursing staff the patient does have an area of breakdown on the sacrum and another questionable area of pending breakdown on his buttocks-we will continue to follow wound care as recommended treatment plan  MRSA positive  DNR   LOS: 3 days  11/07/2011, 3:32 PM  Lonia Blood, MD [facility] Office  [phone] Pager [phone]  On-Call/Text Page:      [email or web address]      password [facility]

## 2011-11-07 NOTE — Clinical Documentation Improvement (Signed)
PRESSURE ULCER DOCUMENTATION CLARIFICATION QUERY  THIS DOCUMENT IS NOT A PERMANENT PART OF THE MEDICAL RECORD  TO RESPOND TO THE THIS QUERY, FOLLOW THE INSTRUCTIONS BELOW:  1. If needed, update documentation for the patient's encounter via the notes activity.  2. Access this query again and click edit on the In [employer].  3. After updating, or not, click F2 to complete all highlighted (required) fields concerning your review. Select "additional documentation in the medical record" OR "no additional documentation provided".  4. Click Sign note button.  5. The deficiency will fall out of your In Basket *Please let us know if you are not able to complete this workflow by phone or e-mail (listed below).  Please update your documentation within the medical record to reflect your response to this query.                                                                                    11/07/11  Dear Dr. Shela Commons. [facility] In a better effort to capture your patient's severity of illness, reflect appropriate length of stay and utilization of resources, a review of the patient medical record has revealed the following indicators.    Based on your clinical judgment, please clarify and document in a progress note and/or discharge summary the clinical condition associated with the following supporting information:  Per MD documentation "nursing home acquired decubitus ulceration "   WOC consult completed.  For clarity of the chart please document the areas of  each decubiti and the stage of each of them.  Thank you                       PLEASE NOTE IF PRESENT ON ADMISSION    Possible Clinical Conditions?   _______Stage  I  Pressure Ulcer   (reddening of the skin) _______Stage  II Pressure Ulcer  (blister open or unopened) _______Stage  III Pressure Ulcer (through all layers skin) _______Stage IV Pressure Ulcer   (through skin & underlying  muscle, tendons, and bones) _______Other  Condition_____________ _______Cannot Clinically Determine    Supporting Information: Per MD doc " Nursing home acquired decubitus ulceration"   Skin Assessment: floor nurse assessment /location sacrum  "stg 3 full thickness tissue loss, subcutaneous tissue maybe exposed but not bone, tendon, or muscle are not exposed "  Wound Nurse Assessment (WOC):WOC consult Note  Reason for Consult: Right buttock with stage 2 wound, present on admission.1X.5X.1cm, pink and dry  Sacrum with unstageable wound, present on admission, 1X1X.1cm, 100%slough, small yellow drainage     Treatment: WOC nurse rec treatment: Air mattress to reduce pressure. Santyl ointment to chemically debride nonviable tissue. Foam dressing to protect right buttock.  Reviewed: No response in chart to query...patient discharged.  Thank You,  Leonette Most Keegen Heffern  Clinical Documentation Specialist RN, BSN:  Pager [phone]  Health Information Management [place]

## 2011-11-08 LAB — GLUCOSE, CAPILLARY
Glucose-Capillary: 154 mg/dL — ABNORMAL HIGH (ref 70–99)
Glucose-Capillary: 144 mg/dL — ABNORMAL HIGH (ref 70–99)

## 2011-11-08 LAB — BASIC METABOLIC PANEL
BUN: 15 mg/dL (ref 6–23)
Calcium: 8.4 mg/dL (ref 8.4–10.5)
Creatinine, Ser: 0.59 mg/dL (ref 0.50–1.35)
GFR calc Af Amer: >90 mL/min (ref >90)
GFR calc non Af Amer: >90 mL/min (ref >90)

## 2011-11-08 LAB — MAGNESIUM: Magnesium: 2.3 mg/dL (ref 1.5–2.5)

## 2011-11-08 MED ORDER — POTASSIUM CHLORIDE 20 MEQ/15ML (10%) PO LIQD
20.0000 meq | Freq: Once | ORAL | Status: AC
  Administered 2011-11-08: 20 meq via ORAL

## 2011-11-08 MED ORDER — MUPIROCIN 2 % EX OINT
1.0000 "application " | TOPICAL_OINTMENT | Freq: Two times a day (BID) | CUTANEOUS | Status: DC
  Administered 2011-11-08 – 2011-11-11 (×7): 1 via NASAL
  Filled 2011-11-08: qty 22

## 2011-11-08 MED ORDER — CHLORHEXIDINE GLUCONATE CLOTH 2 % EX PADS
6.0000 | MEDICATED_PAD | Freq: Every day | CUTANEOUS | Status: DC
  Administered 2011-11-09 – 2011-11-11 (×3): 6 via TOPICAL

## 2011-11-08 MED ORDER — VANCOMYCIN HCL IN DEXTROSE 1-5 GM/200ML-% IV SOLN
1000.0000 mg | Freq: Three times a day (TID) | INTRAVENOUS | Status: DC
  Administered 2011-11-08 – 2011-11-10 (×5): 1000 mg via INTRAVENOUS
  Filled 2011-11-08 (×7): qty 200

## 2011-11-08 NOTE — Progress Notes (Signed)
Antibiotic CONSULT NOTE - FOLLOW UP  Pharmacy Consult for Vancomycin Indication: HCAP  No Known Allergies  Admit Complaint:  70 yo M in chronic vegetative state requiring chronic tube feed and trach sent from nursing home with fever  Pharmacist System-Based Medication Review:  Infectious Disease:Day 3 vanc/zosyn (md) for HCAP. Afeb WBC wnl. BCx ngtd, UCx 12/25 neg, MRSA PCR (+). Also has decub ulcer. Vancomycin trough 13, less than goal 15-20 mcg/ml for pneumonia, will change from 1250 mg IV q12h to 1g IV q8h to aim for higher trough, next dose due at 10 pm tonight. Endocrinology Hyperglycemia, glucose 152-177 on lantus and SSI Gastrointestinal / Nutrition: chronic TF, Promote, Hypokalemia K 3.4 on BID KCL Neurology: severe ICH from ruptured AVM, in persistent vegetative state. On Keppra Pulmonary: Chronic trach PTA Medication Issues: was on Protonix PTA Best Practices: SCD for DVT Prophylaxsis  Assessment: Vancomycin trough less than goal 15-2 mcg/ml  Plan:  1. Vancomycin 1g IV q8h, start at 10 PM tonight. 2. Follow up trough at steady state.  Patient Measurements: Height: 6' (182.9 cm) Weight: 188 lb 11.4 oz (85.6 kg) IBW/kg (Calculated) : 77.6   Vital Signs: Temp: 98.5 F (36.9 C) (12/29 1222) Temp src: Axillary (12/29 1222) BP: 136/74 mmHg (12/29 1222) Pulse Rate: 83  (12/29 1556) Intake/Output from previous day: 12/28 0701 - 12/29 0700 In: 4070 [I.V.:1020; NG/GT:960; IV Piggyback:650] Out: 2625 [Urine:2625] Intake/Output from this shift: Total I/O In: 920 [I.V.:250; Other:420; NG/GT:250] Out: 1300 [Urine:1300]  Labs:  Basename 11/08/11 0700 11/07/11 0600 11/06/11 0350  WBC -- 6.7 9.7  HGB -- 9.8* 10.8*  HCT -- 30.8* 33.8*  PLT -- 131* 159  APTT -- -- --  CREATININE 0.59 0.65 0.68  LABCREA -- -- --  CREATININE 0.59 0.65 0.68  CREAT24HRUR -- -- --  MG 2.3 -- --  PHOS -- -- --  ALBUMIN -- -- --  PROT -- -- --  ALBUMIN -- -- --  AST -- -- --  ALT --  -- --  ALKPHOS -- -- --  BILITOT -- -- --  BILIDIR -- -- --  IBILI -- -- --   Estimated Creatinine Clearance: 95.7 ml/min (by C-G formula based on Cr of 0.59).   Microbiology: Recent Results (from the past 720 hour(s))  CULTURE, BLOOD (ROUTINE X 2)     Status: Normal   Collection Time   10/10/11  6:41 PM      Component Value Range Status Comment   Specimen Description BLOOD LEFT FOREARM   Final    Special Requests BOTTLES DRAWN AEROBIC AND ANAEROBIC [facility]   Final    Setup Time [id number]   Final    Culture NO GROWTH 5 DAYS   Final    Report Status 10/17/2011 FINAL   Final   CULTURE, BLOOD (ROUTINE X 2)     Status: Normal   Collection Time   10/10/11  6:50 PM      Component Value Range Status Comment   Specimen Description BLOOD LEFT FOREARM   Final    Special Requests BOTTLES DRAWN AEROBIC ONLY 10CC   Final    Setup Time [id number]   Final    Culture NO GROWTH 5 DAYS   Final    Report Status 10/17/2011 FINAL   Final   CULTURE, RESPIRATORY     Status: Normal   Collection Time   10/21/11  5:07 PM      Component Value Range Status Comment   Specimen Description TRACHEAL ASPIRATE  Final    Special Requests NONE   Final    Gram Stain     Final    Value: ABUNDANT WBC PRESENT,BOTH PMN AND MONONUCLEAR     FEW SQUAMOUS EPITHELIAL CELLS PRESENT     ABUNDANT GRAM POSITIVE COCCI     IN PAIRS IN CLUSTERS   Culture MODERATE STREPTOCOCCUS GROUP F   Final    Report Status 10/24/2011 FINAL   Final   CULTURE, BLOOD (ROUTINE X 2)     Status: Normal   Collection Time   11-24-11  5:23 PM      Component Value Range Status Comment   Specimen Description BLOOD LEFT HAND   Final    Special Requests BOTTLES DRAWN AEROBIC AND ANAEROBIC 10CC   Final    Setup Time [id number]   Final    Culture NO GROWTH 5 DAYS   Final    Report Status 11/01/2011 FINAL   Final   URINE CULTURE     Status: Normal   Collection Time   11/24/2011  5:34 PM      Component Value Range Status Comment    Specimen Description URINE, CATHETERIZED   Final    Special Requests NONE   Final    Setup Time [id number]   Final    Colony Count 2,000 COLONIES/ML   Final    Culture ENTEROCOCCUS SPECIES   Final    Report Status 10/28/2011 FINAL   Final    Organism ID, Bacteria ENTEROCOCCUS SPECIES   Final   CULTURE, BLOOD (ROUTINE X 2)     Status: Normal   Collection Time   11-24-2011  5:38 PM      Component Value Range Status Comment   Specimen Description BLOOD LEFT ARM   Final    Special Requests BOTTLES DRAWN AEROBIC ONLY 10CC   Final    Setup Time [id number]   Final    Culture NO GROWTH 5 DAYS   Final    Report Status 11/01/2011 FINAL   Final   CULTURE, BLOOD (ROUTINE X 2)     Status: Normal (Preliminary result)   Collection Time   11/04/11  9:00 PM      Component Value Range Status Comment   Specimen Description BLOOD ARM RIGHT   Final    Special Requests BOTTLES DRAWN AEROBIC AND ANAEROBIC 10CCB,5CCR   Final    Setup Time [id number]   Final    Culture     Final    Value:        BLOOD CULTURE RECEIVED NO GROWTH TO DATE CULTURE WILL BE HELD FOR 5 DAYS BEFORE ISSUING A FINAL NEGATIVE REPORT   Report Status PENDING   Incomplete   URINE CULTURE     Status: Normal   Collection Time   11/04/11  9:15 PM      Component Value Range Status Comment   Specimen Description URINE, CATHETERIZED   Final    Special Requests NONE   Final    Setup Time [id number]   Final    Colony Count NO GROWTH   Final    Culture NO GROWTH   Final    Report Status 11/06/2011 FINAL   Final   CULTURE, BLOOD (ROUTINE X 2)     Status: Normal (Preliminary result)   Collection Time   11/04/11  9:15 PM      Component Value Range Status Comment   Specimen Description BLOOD HAND LEFT   Final    Special Requests BOTTLES DRAWN  AEROBIC ONLY 10CC   Final    Setup Time [id number]   Final    Culture     Final    Value:        BLOOD CULTURE RECEIVED NO GROWTH TO DATE CULTURE WILL BE HELD FOR 5 DAYS BEFORE ISSUING A FINAL  NEGATIVE REPORT   Report Status PENDING   Incomplete   MRSA PCR SCREENING     Status: Abnormal   Collection Time   11/05/11  3:35 AM      Component Value Range Status Comment   MRSA by PCR POSITIVE (*) NEGATIVE  Final     Medications:  Prescriptions prior to admission  Medication Sig Dispense Refill  . acetaminophen (TYLENOL) 160 MG/5ML elixir Place 650 mg into feeding tube every 6 (six) hours.        . docusate (COLACE) 50 MG/5ML liquid Place 50 mg into feeding tube 2 (two) times daily.        . insulin aspart (NOVOLOG) 100 UNIT/ML injection Inject 5 Units into the skin every 6 (six) hours. Give if CBG > 250       . insulin glargine (LANTUS) 100 UNIT/ML injection Inject 10 Units into the skin at bedtime.        . levETIRAcetam (KEPPRA) 100 MG/ML solution Place 750 mg into feeding tube 2 (two) times daily.       Marland Kitchen moxifloxacin (AVELOX) 400 MG/250ML IVPB Inject 400 mg into the vein daily. Started on admit 12/14 and finished 12/19       . pantoprazole sodium (PROTONIX) 40 mg/20 mL PACK Place 40 mg into feeding tube daily.        . sennosides (SENOKOT) 8.8 MG/5ML syrup Place 5 mLs into feeding tube 2 (two) times daily.         Merly Hinkson Christine Virginia Crews 11/08/2011,4:18 PM

## 2011-11-08 NOTE — Progress Notes (Signed)
Subjective: Gregory Humphrey is an 70 y.o. male who unfortunately suffered a severe subarachnoid, subdural, parenchymal intracranial hemorrhage resulting in a persistent vegetative state requiring chronic tube feeding and tracheostomy, sent here from nursing home for fever. Reportedly, he has been coughing green sputum through his tracheostomy. Evaluation in emergency room included a chest x-ray which showed bi-basilar infiltrates. He did have a temperature of 102 in emergency room and has leukocytosis with a white count of 13,000.  The patient is unresponsive. Appears comfortably. No involuntary movements noted. No family present.  Objective: Weight change: 0.3 kg (10.6 oz)  Intake/Output Summary (Last 24 hours) at 11/08/11 1610 Last data filed at 11/08/11 0600  Gross per 24 hour  Intake   3820 ml  Output   2500 ml  Net   1320 ml   Blood pressure 144/79, pulse 82, temperature 98.3 F (36.8 C), temperature source Axillary, resp. rate 18, height 6' (1.829 m), weight 85.6 kg (188 lb 11.4 oz), SpO2 100.00%.  Physical Exam: General: No acute respiratory distress Lungs: Clear to auscultation bilaterally- bases are now clear and upper airway sounds are much improved Cardiovascular: Regular rate and rhythm without murmur gallop or rub normal S1 and S2 Abdomen: Nontender, nondistended, soft, bowel sounds positive, no rebound, no ascites, no appreciable mass-PEG tube insertion clean and dry Extremities: No significant cyanosis, clubbing, or edema bilateral lower extremities GU:  Foley catheter in place draining concentrated yellow urine  Lab Results:  Basename 11/08/11 0700 11/07/11 0600 11/06/11 0350  NA 146* 150* 153*  K 3.4* 3.0* 3.4*  CL 111 115* 117*  CO2 29 27 27   GLUCOSE 152* 177* 166*  BUN 15 22 27*  CREATININE 0.59 0.65 0.68  CALCIUM 8.4 8.4 8.9  MG 2.3 -- --  PHOS -- -- --    Basename 11/07/11 0600 11/06/11 0350  WBC 6.7 9.7  NEUTROABS -- --  HGB 9.8* 10.8*  HCT 30.8*  33.8*  MCV 93.1 95.8  PLT 131* 159   Micro Results: Recent Results (from the past 240 hour(s))  CULTURE, BLOOD (ROUTINE X 2)     Status: Normal (Preliminary result)   Collection Time   11/04/11  9:00 PM      Component Value Range Status Comment   Specimen Description BLOOD ARM RIGHT   Final    Special Requests BOTTLES DRAWN AEROBIC AND ANAEROBIC 10CCB,5CCR   Final    Setup Time [id number]   Final    Culture     Final    Value:        BLOOD CULTURE RECEIVED NO GROWTH TO DATE CULTURE WILL BE HELD FOR 5 DAYS BEFORE ISSUING A FINAL NEGATIVE REPORT   Report Status PENDING   Incomplete   URINE CULTURE     Status: Normal   Collection Time   11/04/11  9:15 PM      Component Value Range Status Comment   Specimen Description URINE, CATHETERIZED   Final    Special Requests NONE   Final    Setup Time [id number]   Final    Colony Count NO GROWTH   Final    Culture NO GROWTH   Final    Report Status 11/06/2011 FINAL   Final   CULTURE, BLOOD (ROUTINE X 2)     Status: Normal (Preliminary result)   Collection Time   11/04/11  9:15 PM      Component Value Range Status Comment   Specimen Description BLOOD HAND LEFT   Final  Special Requests BOTTLES DRAWN AEROBIC ONLY 10CC   Final    Setup Time [id number]   Final    Culture     Final    Value:        BLOOD CULTURE RECEIVED NO GROWTH TO DATE CULTURE WILL BE HELD FOR 5 DAYS BEFORE ISSUING A FINAL NEGATIVE REPORT   Report Status PENDING   Incomplete   MRSA PCR SCREENING     Status: Abnormal   Collection Time   11/05/11  3:35 AM      Component Value Range Status Comment   MRSA by PCR POSITIVE (*) NEGATIVE  Final    Assessment/Plan:  Healthcare acquired pneumonia vs acute tracheobronchitis Continue empiric IV antibiotic- Zosyn and Vanc-  -followup repeat chest x-ray suggest no focal infiltrate raising the question of whether his acute infection was more of a tracheobronchitis-clinically he is much improved-we will complete a seven-day  course of antibiotic therapy  Persistent vegetative state after a severe 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  Status post neurosurgical evacuation 10/08/2011-PEG tube dependent-we have begun a search for a new facility (at wife's request) in anticipation of discharge early next week  Chronic respiratory failure status post tracheostomy  a concern has been raised regarding the trach care the patient was previously receiving at his skilled nursing facility-it is possible this could have exposed the patient to a tracheobronchitis-clinically he is improving-trach care continue  Hypertension Blood pressure is currently well-controlled  Hypokalemia Repleting  Gilbert's syndrome  Hyperglycemia Continue sliding scale insulin with CBGs every 4 hours  Hypernatremia This appears to be a hypovolemic hypernatremia-is slowly improving with volume resuscitation-we will continue to follow  Normocytic anemia This is likely due to the patient's severe general illness and poor nutritional status-there is no evidence of blood loss-the current decrease in hemoglobin appears to be due to dilution-we will recheck in the morning  Nursing home acquired decubitus ulceration As per the nursing staff the patient does have an area of breakdown on the sacrum and another questionable area of pending breakdown on his buttocks-we will continue to follow wound care as recommended treatment plan  MRSA positive  DNR   LOS: 4 days  11/08/2011, 8:22 AM  Calvert Cantor MD [facility] Office  [phone] Pager [phone]  On-Call/Text Page:      [email or web address]      password [facility]

## 2011-11-09 LAB — BASIC METABOLIC PANEL
CO2: 28 mEq/L (ref 19–32)
Calcium: 8.7 mg/dL (ref 8.4–10.5)
Chloride: 110 mEq/L (ref 96–112)
Creatinine, Ser: 0.59 mg/dL (ref 0.50–1.35)
Glucose, Bld: 131 mg/dL — ABNORMAL HIGH (ref 70–99)

## 2011-11-09 LAB — GLUCOSE, CAPILLARY
Glucose-Capillary: 155 mg/dL — ABNORMAL HIGH (ref 70–99)
Glucose-Capillary: 132 mg/dL — ABNORMAL HIGH (ref 70–99)
Glucose-Capillary: 142 mg/dL — ABNORMAL HIGH (ref 70–99)
Glucose-Capillary: 149 mg/dL — ABNORMAL HIGH (ref 70–99)
Glucose-Capillary: 198 mg/dL — ABNORMAL HIGH (ref 70–99)

## 2011-11-09 NOTE — Progress Notes (Signed)
Subjective: Gregory Humphrey is an 70 y.o. male who unfortunately suffered a severe subarachnoid, subdural, parenchymal intracranial hemorrhage resulting in a persistent vegetative state requiring chronic tube feeding and tracheostomy, sent here from nursing home for fever. Reportedly, he has been coughing green sputum through his tracheostomy. Evaluation in emergency room included a chest x-ray which showed bi-basilar infiltrates. He did have a temperature of 102 in emergency room and has leukocytosis with a white count of 13,000.  The patient is unresponsive. His wife is present today and states that he has not awoken after her brain injury but she is still hoping for him to awaken.    Objective: Weight change: 6.9 kg (15 lb 3.4 oz)  Intake/Output Summary (Last 24 hours) at 11/09/11 1449 Last data filed at 11/09/11 1400  Gross per 24 hour  Intake 3599.17 ml  Output   4155 ml  Net -555.83 ml   Blood pressure 134/81, pulse 76, temperature 98.2 F (36.8 C), temperature source Axillary, resp. rate 21, height 6' (1.829 m), weight 92.5 kg (203 lb 14.8 oz), SpO2 96.00%.  Physical Exam: General: No acute respiratory distress Lungs: Clear to auscultation bilaterally- bases are now clear and upper airway sounds are much improved Cardiovascular: Regular rate and rhythm without murmur gallop or rub normal S1 and S2 Abdomen: Nontender, nondistended, soft, bowel sounds positive, no rebound, no ascites, no appreciable mass-PEG tube insertion clean and dry Extremities: No significant cyanosis, clubbing, or edema bilateral lower extremities GU:  Foley catheter in place draining concentrated yellow urine  Lab Results:  Basename 11/09/11 0511 11/08/11 0700 11/07/11 0600  NA 143 146* 150*  K 3.7 3.4* 3.0*  CL 110 111 115*  CO2 28 29 27   GLUCOSE 131* 152* 177*  BUN 14 15 22   CREATININE 0.59 0.59 0.65  CALCIUM 8.7 8.4 8.4  MG -- 2.3 --  PHOS -- -- --    Basename 11/07/11 0600  WBC 6.7    NEUTROABS --  HGB 9.8*  HCT 30.8*  MCV 93.1  PLT 131*   Micro Results: Recent Results (from the past 240 hour(s))  CULTURE, BLOOD (ROUTINE X 2)     Status: Normal (Preliminary result)   Collection Time   11/04/11  9:00 PM      Component Value Range Status Comment   Specimen Description BLOOD ARM RIGHT   Final    Special Requests BOTTLES DRAWN AEROBIC AND ANAEROBIC 10CCB,5CCR   Final    Setup Time [id number]   Final    Culture     Final    Value:        BLOOD CULTURE RECEIVED NO GROWTH TO DATE CULTURE WILL BE HELD FOR 5 DAYS BEFORE ISSUING A FINAL NEGATIVE REPORT   Report Status PENDING   Incomplete   URINE CULTURE     Status: Normal   Collection Time   11/04/11  9:15 PM      Component Value Range Status Comment   Specimen Description URINE, CATHETERIZED   Final    Special Requests NONE   Final    Setup Time [id number]   Final    Colony Count NO GROWTH   Final    Culture NO GROWTH   Final    Report Status 11/06/2011 FINAL   Final   CULTURE, BLOOD (ROUTINE X 2)     Status: Normal (Preliminary result)   Collection Time   11/04/11  9:15 PM      Component Value Range Status Comment   Specimen  Description BLOOD HAND LEFT   Final    Special Requests BOTTLES DRAWN AEROBIC ONLY 10CC   Final    Setup Time [id number]   Final    Culture     Final    Value:        BLOOD CULTURE RECEIVED NO GROWTH TO DATE CULTURE WILL BE HELD FOR 5 DAYS BEFORE ISSUING A FINAL NEGATIVE REPORT   Report Status PENDING   Incomplete   MRSA PCR SCREENING     Status: Abnormal   Collection Time   11/05/11  3:35 AM      Component Value Range Status Comment   MRSA by PCR POSITIVE (*) NEGATIVE  Final    Assessment/Plan:  Healthcare acquired pneumonia vs acute tracheobronchitis Continue empiric IV antibiotic- Zosyn and Vanc-  -followup repeat chest x-ray suggest no focal infiltrate raising the question of whether his acute infection was more of a tracheobronchitis-clinically he is much improved-we will  complete a seven-day course of antibiotic therapy- (end 1/2)  Persistent vegetative state after a severe 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  Status post neurosurgical evacuation 10/08/2011-PEG tube dependent-we have begun a search for a new facility (at wife's request) in anticipation of discharge early next week  Chronic respiratory failure status post tracheostomy  a concern has been raised regarding the trach care the patient was previously receiving at his skilled nursing facility-it is possible this could have exposed the patient to a tracheobronchitis-clinically he is improving-trach care continue  Hypertension Blood pressure is currently well-controlled  Hypokalemia Repleting  Gilbert's syndrome  Hyperglycemia contolled- stop sliding scale.   Hypernatremia This appears to be a hypovolemic hypernatremia and has slowly improving with volume resuscitation-we will continue to follow  Normocytic anemia This is likely due to the patient's severe general illness and poor nutritional status-there is no evidence of blood loss-the current decrease in hemoglobin appears to be due to dilution-we will recheck in the morning  Nursing home acquired decubitus ulceration As per the nursing staff the patient does have an area of breakdown on the sacrum and another questionable area of pending breakdown on his buttocks-we will continue to follow wound care as recommended treatment plan  MRSA positive  DNR   LOS: 5 days  11/09/2011, 2:49 PM  Calvert Cantor MD [facility] Office  [phone] Pager [phone]  On-Call/Text Page:      [email or web address]      password [facility]

## 2011-11-10 LAB — GLUCOSE, CAPILLARY
Glucose-Capillary: 157 mg/dL — ABNORMAL HIGH (ref 70–99)
Glucose-Capillary: 150 mg/dL — ABNORMAL HIGH (ref 70–99)
Glucose-Capillary: 165 mg/dL — ABNORMAL HIGH (ref 70–99)
Glucose-Capillary: 162 mg/dL — ABNORMAL HIGH (ref 70–99)

## 2011-11-10 LAB — VANCOMYCIN, TROUGH: Vancomycin Tr: 24.6 ug/mL — ABNORMAL HIGH (ref 10.0–20.0)

## 2011-11-10 MED ORDER — VANCOMYCIN HCL IN DEXTROSE 1-5 GM/200ML-% IV SOLN
1000.0000 mg | Freq: Two times a day (BID) | INTRAVENOUS | Status: DC
  Administered 2011-11-10 – 2011-11-11 (×2): 1000 mg via INTRAVENOUS
  Filled 2011-11-10 (×3): qty 200

## 2011-11-10 NOTE — Progress Notes (Signed)
Subjective: Gregory Humphrey is an 70 y.o. male who unfortunately suffered a severe subarachnoid, subdural, parenchymal intracranial hemorrhage resulting in a persistent vegetative state requiring chronic tube feeding and tracheostomy, sent here from nursing home for fever. Reportedly, he has been coughing green sputum through his tracheostomy. Evaluation in emergency room included a chest x-ray which showed bi-basilar infiltrates. He did have a temperature of 102 in emergency room and has leukocytosis with a white count of 13,000.  The patient is noted to be opening his eyes and smiling briefly. He is able to squeeze his right hand on command.    Objective: Weight change: -0.2 kg (-7.1 oz)  Intake/Output Summary (Last 24 hours) at 11/10/11 1532 Last data filed at 11/10/11 1500  Gross per 24 hour  Intake   1770 ml  Output   1950 ml  Net   -180 ml   Blood pressure 141/86, pulse 76, temperature 97.5 F (36.4 C), temperature source Axillary, resp. rate 17, height 6' (1.829 m), weight 92.3 kg (203 lb 7.8 oz), SpO2 98.00%.  Physical Exam: General: No acute respiratory distress Lungs: Clear to auscultation bilaterally- bases are now clear and upper airway sounds are much improved Cardiovascular: Regular rate and rhythm without murmur gallop or rub normal S1 and S2 Abdomen: Nontender, nondistended, soft, bowel sounds positive, no rebound, no ascites, no appreciable mass-PEG tube insertion clean and dry Extremities: No significant cyanosis, clubbing, or edema bilateral lower extremities GU:  Foley catheter in place draining concentrated yellow urine Neuro: Awakens, Has a mild left ptosis and flaccid right side. He has 4/5 strength in his right arm. I am unable to properly assess strength in Rt leg but he does move it.   Lab Results:  Basename 11/09/11 0511 11/08/11 0700  NA 143 146*  K 3.7 3.4*  CL 110 111  CO2 28 29  GLUCOSE 131* 152*  BUN 14 15  CREATININE 0.59 0.59  CALCIUM 8.7 8.4    MG -- 2.3  PHOS -- --   No results found for this basename: WBC:3,NEUTROABS:3,HGB:3,HCT:3,MCV:3,PLT:3 in the last 72 hours Micro Results:  Assessment/Plan:  Healthcare acquired pneumonia vs acute tracheobronchitis Continue empiric IV antibiotic- Zosyn and Vanc-  -followup repeat chest x-ray suggest no focal infiltrate raising the question of whether his acute infection was more of a tracheobronchitis-clinically he is much improved-we will complete a seven-day course of antibiotic therapy- (end 1/2)  Persistent vegetative state after a severe 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 Left side remains flaccid but he is moving the right side now.   Chronic respiratory failure status post tracheostomy  a concern has been raised regarding the trach care the patient was previously receiving at his skilled nursing facility-it is possible this could have exposed the patient to a tracheobronchitis-clinically he is improving-trach care continue  Hypertension Blood pressure is currently well-controlled  Hypokalemia Repleting  Gilbert's syndrome  Hyperglycemia contolled- stop sliding scale.   Hypernatremia This appears to be a hypovolemic hypernatremia and has slowly improved with volume resuscitation-  Normocytic anemia This is likely due to the patient's severe general illness and poor nutritional status-there is no evidence of blood loss-the current decrease in hemoglobin appears to be due to dilution-  Nursing home acquired decubitus ulceration As per the nursing staff the patient does  have an area of breakdown on the sacrum and another questionable area of pending breakdown on his buttocks-we will continue to follow wound care as recommended treatment  plan  MRSA positive  DNR  Disposition- Hoping to discharge to [facility] within 1-2 days if his insurance has been approved.    LOS: 6 days  11/10/2011, 3:32 PM  Calvert Cantor MD [facility] Office  [phone] Pager [phone]  On-Call/Text Page:      [email or web address]      password [facility]

## 2011-11-10 NOTE — Plan of Care (Signed)
Problem: Phase I Progression Outcomes Goal: Tolerating diet Outcome: Progressing Tolerating TF well

## 2011-11-10 NOTE — Progress Notes (Signed)
PT IS TO DC TO [facility] ON TOMORROW, FAMILY/ UNIT INFORMED OF TRANSFER. Gregory Humphrey 11/10/2011 [phone] OR [phone]

## 2011-11-10 NOTE — Progress Notes (Signed)
ANTIBIOTIC CONSULT NOTE - FOLLOW UP  Pharmacy Consult for vancomycin Indication: HCAP  No Known Allergies  Patient Measurements: Height: 6' (182.9 cm) Weight: 203 lb 7.8 oz (92.3 kg) IBW/kg (Calculated) : 77.6  Adjusted Body Weight:   Vital Signs: Temp: 98.6 F (37 C) (12/31 0332) Temp src: Oral (12/31 0332) BP: 138/78 mmHg (12/31 0332) Pulse Rate: 73  (12/31 0430) Intake/Output from previous day: 12/30 0701 - 12/31 0700 In: 1810 [I.V.:360; NG/GT:250] Out: 3855 [Urine:3855] Intake/Output from this shift:    Labs:  Basename 11/09/11 0511 11/08/11 0700  WBC -- --  HGB -- --  PLT -- --  LABCREA -- --  CREATININE 0.59 0.59   Estimated Creatinine Clearance: 95.7 ml/min (by C-G formula based on Cr of 0.59).  Basename 11/10/11 0527 11/08/11 1404  VANCOTROUGH 24.6* 13.7  VANCOPEAK -- --  Drue Dun -- --  GENTTROUGH -- --  GENTPEAK -- --  GENTRANDOM -- --  TOBRATROUGH -- --  TOBRAPEAK -- --  TOBRARND -- --  AMIKACINPEAK -- --  AMIKACINTROU -- --  AMIKACIN -- --     Microbiology: Recent Results (from the past 720 hour(s))  CULTURE, RESPIRATORY     Status: Normal   Collection Time   10/21/11  5:07 PM      Component Value Range Status Comment   Specimen Description TRACHEAL ASPIRATE   Final    Special Requests NONE   Final    Gram Stain     Final    Value: ABUNDANT WBC PRESENT,BOTH PMN AND MONONUCLEAR     FEW SQUAMOUS EPITHELIAL CELLS PRESENT     ABUNDANT GRAM POSITIVE COCCI     IN PAIRS IN CLUSTERS   Culture MODERATE STREPTOCOCCUS GROUP F   Final    Report Status 10/24/2011 FINAL   Final   CULTURE, BLOOD (ROUTINE X 2)     Status: Normal   Collection Time   11-Nov-2011  5:23 PM      Component Value Range Status Comment   Specimen Description BLOOD LEFT HAND   Final    Special Requests BOTTLES DRAWN AEROBIC AND ANAEROBIC 10CC   Final    Setup Time [id number]   Final    Culture NO GROWTH 5 DAYS   Final    Report Status 11/01/2011 FINAL   Final   URINE  CULTURE     Status: Normal   Collection Time   2011-11-11  5:34 PM      Component Value Range Status Comment   Specimen Description URINE, CATHETERIZED   Final    Special Requests NONE   Final    Setup Time [id number]   Final    Colony Count 2,000 COLONIES/ML   Final    Culture ENTEROCOCCUS SPECIES   Final    Report Status 10/28/2011 FINAL   Final    Organism ID, Bacteria ENTEROCOCCUS SPECIES   Final   CULTURE, BLOOD (ROUTINE X 2)     Status: Normal   Collection Time   11-11-11  5:38 PM      Component Value Range Status Comment   Specimen Description BLOOD LEFT ARM   Final    Special Requests BOTTLES DRAWN AEROBIC ONLY 10CC   Final    Setup Time [id number]   Final    Culture NO GROWTH 5 DAYS   Final    Report Status 11/01/2011 FINAL   Final   CULTURE, BLOOD (ROUTINE X 2)     Status: Normal (Preliminary result)   Collection Time  11/04/11  9:00 PM      Component Value Range Status Comment   Specimen Description BLOOD ARM RIGHT   Final    Special Requests BOTTLES DRAWN AEROBIC AND ANAEROBIC 10CCB,5CCR   Final    Setup Time [id number]   Final    Culture     Final    Value:        BLOOD CULTURE RECEIVED NO GROWTH TO DATE CULTURE WILL BE HELD FOR 5 DAYS BEFORE ISSUING A FINAL NEGATIVE REPORT   Report Status PENDING   Incomplete   URINE CULTURE     Status: Normal   Collection Time   11/04/11  9:15 PM      Component Value Range Status Comment   Specimen Description URINE, CATHETERIZED   Final    Special Requests NONE   Final    Setup Time [id number]   Final    Colony Count NO GROWTH   Final    Culture NO GROWTH   Final    Report Status 11/06/2011 FINAL   Final   CULTURE, BLOOD (ROUTINE X 2)     Status: Normal (Preliminary result)   Collection Time   11/04/11  9:15 PM      Component Value Range Status Comment   Specimen Description BLOOD HAND LEFT   Final    Special Requests BOTTLES DRAWN AEROBIC ONLY 10CC   Final    Setup Time [id number]   Final    Culture     Final      Value:        BLOOD CULTURE RECEIVED NO GROWTH TO DATE CULTURE WILL BE HELD FOR 5 DAYS BEFORE ISSUING A FINAL NEGATIVE REPORT   Report Status PENDING   Incomplete   MRSA PCR SCREENING     Status: Abnormal   Collection Time   11/05/11  3:35 AM      Component Value Range Status Comment   MRSA by PCR POSITIVE (*) NEGATIVE  Final     Anti-infectives     Start     Dose/Rate Route Frequency Ordered Stop   11/08/11 2200   vancomycin (VANCOCIN) IVPB 1000 mg/200 mL premix        1,000 mg 200 mL/hr over 60 Minutes Intravenous Every 8 hours 11/08/11 1631     11/06/11 0600  piperacillin-tazobactam (ZOSYN) IVPB 3.375 g       3.375 g 12.5 mL/hr over 240 Minutes Intravenous 3 times per day 11/05/11 0142 11/13/11 0559   11/05/11 1500   vancomycin (VANCOCIN) 1,250 mg in sodium chloride 0.9 % 250 mL IVPB  Status:  Discontinued        1,250 mg 166.7 mL/hr over 90 Minutes Intravenous Every 12 hours 11/05/11 0139 11/08/11 1631   11/05/11 0132   piperacillin-tazobactam (ZOSYN) IVPB 3.375 g  Status:  Discontinued        3.375 g 100 mL/hr over 30 Minutes Intravenous 3 times per day 11/05/11 0132 11/05/11 0133   11/04/11 2145   vancomycin (VANCOCIN) IVPB 1000 mg/200 mL premix        1,000 mg 200 mL/hr over 60 Minutes Intravenous  Once 11/04/11 2138 11/04/11 2241   11/04/11 2145  piperacillin-tazobactam (ZOSYN) IVPB 3.375 g       3.375 g 12.5 mL/hr over 240 Minutes Intravenous  Once 11/04/11 2138 11/05/11 0247          Assessment: 70 yo M in chronic vegetative state requiring chronic tube feed and trach sent from nursing home with fever  on day #4 vancomycin/zosyn for HCAP  Vancomycin trough this am - 24.6 with goal 15-20 Renal function has been stable with est. CrCl in the 90s. I/O's have also been consistent. Patient is likely accumulating the vancomycin given q 8 h dosing and increased age.   Patient has remained afebrille, no cbc ordered, bld cxs-ngtd, urine cx -ng   Goal of Therapy:   Vancomycin trough level 15-20 mcg/ml  Plan:  1.Reduce vancomycin to 1g IV q 12 hours 2.Recheck vancomycin level at steady state. 3.Follow up culture data 4.Consider checking CBC  Severiano Gilbert 11/10/2011,7:56 AM

## 2011-11-11 ENCOUNTER — Other Ambulatory Visit (HOSPITAL_COMMUNITY): Payer: Medicare Other

## 2011-11-11 ENCOUNTER — Institutional Professional Consult (permissible substitution)
Admission: AD | Admit: 2011-11-11 | Discharge: 2011-12-08 | Disposition: A | Discharge status: Skilled Nursing Facility | Payer: Medicare Other | Source: Ambulatory Visit | Attending: Internal Medicine | Admitting: Internal Medicine

## 2011-11-11 DIAGNOSIS — G8194 Hemiplegia, unspecified affecting left nondominant side: Secondary | ICD-10-CM | POA: Diagnosis present

## 2011-11-11 DIAGNOSIS — Z931 Gastrostomy status: Secondary | ICD-10-CM

## 2011-11-11 DIAGNOSIS — Z930 Tracheostomy status: Secondary | ICD-10-CM

## 2011-11-11 LAB — GLUCOSE, CAPILLARY
Glucose-Capillary: 145 mg/dL — ABNORMAL HIGH (ref 70–99)
Glucose-Capillary: 137 mg/dL — ABNORMAL HIGH (ref 70–99)

## 2011-11-11 LAB — CULTURE, BLOOD (ROUTINE X 2): Culture: NO GROWTH

## 2011-11-11 MED ORDER — PROMOTE PO LIQD: 1000.0000 mL | ORAL | Status: DC

## 2011-11-11 MED ORDER — FREE WATER: 250.0000 mL | Freq: Four times a day (QID) | Status: DC

## 2011-11-11 MED ORDER — DOCUSATE SODIUM 50 MG/5ML PO LIQD: 50.0000 mg | Freq: Two times a day (BID) | ORAL | Status: DC

## 2011-11-11 MED ORDER — COLLAGENASE 250 UNIT/GM EX OINT: 1.0000 "application " | TOPICAL_OINTMENT | Freq: Every day | CUTANEOUS | Status: AC

## 2011-11-11 MED ORDER — PIPERACILLIN-TAZOBACTAM 3.375 G IVPB: 3.3750 g | Freq: Three times a day (TID) | INTRAVENOUS | Status: DC

## 2011-11-11 MED ORDER — VANCOMYCIN HCL IN DEXTROSE 1-5 GM/200ML-% IV SOLN: 1000.0000 mg | Freq: Two times a day (BID) | INTRAVENOUS | Status: DC

## 2011-11-11 MED ORDER — INSULIN ASPART 100 UNIT/ML ~~LOC~~ SOLN: 3.0000 [IU] | SUBCUTANEOUS | Status: DC

## 2011-11-11 MED ORDER — MUPIROCIN 2 % EX OINT: 1.0000 "application " | TOPICAL_OINTMENT | Freq: Two times a day (BID) | CUTANEOUS | Status: AC

## 2011-11-11 MED ORDER — CHLORHEXIDINE GLUCONATE CLOTH 2 % EX PADS: 6.0000 | MEDICATED_PAD | Freq: Every day | CUTANEOUS | Status: DC

## 2011-11-11 NOTE — Discharge Summary (Signed)
DISCHARGE SUMMARY  Gregory Humphrey  MR#: [id number]  DOB:12-16-1940  Date of Admission: 11/04/2011 Date of Discharge: 11/11/2011  Attending Physician:Sadako Cegielski  Patient's WUJ:[id number] Hetty Ely, MD, MD  Consults:Treatment Team:  Marguerite Olea  Presenting Complaint: Sent for fever  Discharge Diagnoses:   Tracheobronchitis  Tracheostomy in place  G tube feedings  Chronic respiratory failure  Physical deconditioning  Left hemiparesis  CARBOHYDRATE METABOLISM DISORDER    Discharge Medications: Current Discharge Medication List    START taking these medications   Details  Chlorhexidine Gluconate Cloth 2 % PADS Apply 6 each topically daily at 6 (six) AM. Qty: 5 each, Refills: 0    collagenase (SANTYL) ointment Apply 1 application topically daily. Qty: 15 g, Refills: 0    mupirocin ointment (BACTROBAN) 2 % Apply 1 application topically 2 (two) times daily. Qty: 22 g, Refills: 0    piperacillin-tazobactam (ZOSYN) 3-0.375 GM/50ML IVPB Inject 50 mLs (3.375 g total) into the vein every 8 (eight) hours. Qty: 50 mL    vancomycin (VANCOCIN) 1 GM/200ML SOLN Inject 200 mLs (1,000 mg total) into the vein every 12 (twelve) hours. Qty: 4000 mL      CONTINUE these medications which have CHANGED   Details  docusate (COLACE) 50 MG/5ML liquid Place 5 mLs (50 mg total) into feeding tube 2 (two) times daily. Qty: 100 mL, Refills: 0    insulin aspart (NOVOLOG) 100 UNIT/ML injection Inject 3 Units into the skin every 4 (four) hours. Qty: 1 vial, Refills: 0    Nutritional Supplements (FEEDING SUPPLEMENT, PROMOTE,) LIQD Place 1,000 mLs into feeding tube continuous.    Water For Irrigation, Sterile (FREE WATER) SOLN Place 250 mLs into feeding tube every 6 (six) hours.      CONTINUE these medications which have NOT CHANGED   Details  acetaminophen (TYLENOL) 160 MG/5ML elixir Place 650 mg into feeding tube every 6 (six) hours.      insulin glargine (LANTUS) 100 UNIT/ML injection  Inject 10 Units into the skin at bedtime.      levETIRAcetam (KEPPRA) 100 MG/ML solution Place 750 mg into feeding tube 2 (two) times daily.     pantoprazole sodium (PROTONIX) 40 mg/20 mL PACK Place 40 mg into feeding tube daily.        STOP taking these medications     moxifloxacin (AVELOX) 400 MG/250ML IVPB      sennosides (SENOKOT) 8.8 MG/5ML syrup      sennosides (SENOKOT) 8.8 MG/5ML syrup      moxifloxacin (AVELOX) 400 MG/250ML IVPB      sodium chloride 0.9 % injection          Procedures:  Dg Chest Port 1 View  11/07/2011  *RADIOLOGY REPORT*  Clinical Data: Follow up infiltrates.  PORTABLE CHEST - 1 VIEW  Comparison: 11/04/2011 and 2011-11-20.  Findings: 0640 hours.  Tracheostomy appears unchanged.  There is mild patient rotation to the right.  Allowing for this, the primarily linear bibasilar air space opacities are unchanged. There is no edema, pneumothorax or significant pleural effusion. Heart size and mediastinal contours are stable.  IMPRESSION: Stable low lung volumes with associated bibasilar opacities radiographically most consistent with atelectasis.  Original Report Authenticated By: Gerrianne Scale, M.D.   Dg Chest Port 1 View  11/04/2011  *RADIOLOGY REPORT*  Clinical Data: Fever, unresponsive  PORTABLE CHEST - 1 VIEW  Comparison: Chest radiograph 11/20/2011  Findings: Tracheostomy tube is unchanged.  Normal cardiac silhouette.  There is linear markings at the left and right lung base which  are similar to prior to a slightly increased.  No evidence of pneumothorax.  No pulmonary edema.  IMPRESSION: Bibasilar atelectasis versus infiltrates may be slightly increased.  Original Report Authenticated By: Genevive Bi, M.D.      Hospital Course: This is a 71 y/o male who has right hemiparesis after a large brain hemorrhage on 10/07/11 and has since had a tracheostomy and PEG tube. He was discharge to a nursing facility but returned on 12/25 due to a  tracheobronchitis thought to have resulted from poor trach care.   Tracheobronchitits- This has been treated so far with a 6 day course of Vanc and Zosyn. Antibiotics can be continued for 1 more day and then stopped. Blood cultures have been negative and CXr has revealed only atelectasis. Sputum production has decreased considerably.   Hypernatremia- this was most likely in relation to dehydration as his BUN/Creatinine ratio was also increased. We hydrated him with hypotonic fluids correcting the sodium from 155 to 143 at which point fluids were discontinued. He is maintained on tube feeds with fluid boluses and has had adequate urine output.   Chronic respiratory failure/Tracheostomy in place- hopefully this can eventually be removed.    Left hemiparesis- occuring after a spontaneous parenchymal hemorrhage though to be from a ruptured brain aneurysm. He does awaken, open his eyes, smiles and occasionally follows commands. He is able to move his right side but there is weakness for which he will continue to require physical therapy.     G tube feedings- he has been tolerating Promote at 60 cc/hr without diarrhea.    CARBOHYDRATE METABOLISM DISORDER- being treated with low doses of insulin.    Physical deconditioning  Decubitus ulcer- wound care being done. He has an air mattress and foley catheter to prevent contamination of wound.   Day of Discharge Physical Exam: BP 129/78  Pulse 74  Temp(Src) 98.9 F (37.2 C) (Oral)  Resp 15  Ht 6' (1.829 m)  Wt 92.3 kg (203 lb 7.8 oz)  BMI 27.60 kg/m2  SpO2 100% General: No acute respiratory distress  Lungs: Clear to auscultation bilaterally- bases are now clear and upper airway sounds are much improved  Cardiovascular: Regular rate and rhythm without murmur gallop or rub normal S1 and S2  Abdomen: Nontender, nondistended, soft, bowel sounds positive, no rebound, no ascites, no appreciable mass-PEG tube insertion clean and dry  Extremities: No  significant cyanosis, clubbing, or edema bilateral lower extremities  GU: Foley catheter in place draining clear yellow urine  Neuro: Awakens. occassionally follows commands.  Has a mild left ptosis and flaccid right side. He has 4/5 strength in his right arm. I am unable to properly assess strength in Rt leg but he does move it on his own when he wants to.     Disposition: [facility]    Follow-up Appts: Discharge Orders    Future Orders Please Complete By Expires   Increase activity slowly          Time on Discharge: Greater than 45 min.   SignedCalvert Cantor 11/11/2011, 10:12 AM

## 2011-11-11 DEATH — deceased

## 2011-11-12 LAB — BASIC METABOLIC PANEL
BUN: 17 mg/dL (ref 6–23)
CO2: 28 mEq/L (ref 19–32)
Glucose, Bld: 175 mg/dL — ABNORMAL HIGH (ref 70–99)
Potassium: 5.1 mEq/L (ref 3.5–5.1)
Sodium: 141 mEq/L (ref 135–145)

## 2011-11-12 LAB — CBC
HCT: 36.4 % — ABNORMAL LOW (ref 39.0–52.0)
Hemoglobin: 12.1 g/dL — ABNORMAL LOW (ref 13.0–17.0)
MCHC: 33.2 g/dL (ref 30.0–36.0)
RBC: 3.97 MIL/uL — ABNORMAL LOW (ref 4.22–5.81)

## 2011-11-13 LAB — POTASSIUM: Potassium: 4 mEq/L (ref 3.5–5.1)

## 2011-11-15 LAB — CULTURE, RESPIRATORY W GRAM STAIN

## 2011-11-16 LAB — CBC
HCT: 36.8 % — ABNORMAL LOW (ref 39.0–52.0)
Hemoglobin: 11.8 g/dL — ABNORMAL LOW (ref 13.0–17.0)
MCV: 94.4 fL (ref 78.0–100.0)
RBC: 3.9 MIL/uL — ABNORMAL LOW (ref 4.22–5.81)
RDW: 15.1 % (ref 11.5–15.5)
WBC: 5.7 10*3/uL (ref 4.0–10.5)

## 2011-11-16 LAB — BASIC METABOLIC PANEL WITH GFR
BUN: 24 mg/dL — ABNORMAL HIGH (ref 6–23)
CO2: 28 meq/L (ref 19–32)
Calcium: 9.8 mg/dL (ref 8.4–10.5)
Chloride: 113 meq/L — ABNORMAL HIGH (ref 96–112)
Creatinine, Ser: 0.61 mg/dL (ref 0.50–1.35)
GFR calc Af Amer: >90 mL/min
GFR calc non Af Amer: >90 mL/min
Glucose, Bld: 177 mg/dL — ABNORMAL HIGH (ref 70–99)
Potassium: 3.4 meq/L — ABNORMAL LOW (ref 3.5–5.1)
Sodium: 152 meq/L — ABNORMAL HIGH (ref 135–145)

## 2011-11-16 LAB — MAGNESIUM: Magnesium: 2.2 mg/dL (ref 1.5–2.5)

## 2011-11-17 LAB — BASIC METABOLIC PANEL
Chloride: 110 mEq/L (ref 96–112)
GFR calc non Af Amer: >90 mL/min (ref >90)
Glucose, Bld: 160 mg/dL — ABNORMAL HIGH (ref 70–99)
Potassium: 3.3 mEq/L — ABNORMAL LOW (ref 3.5–5.1)
Sodium: 148 mEq/L — ABNORMAL HIGH (ref 135–145)

## 2011-11-18 LAB — BASIC METABOLIC PANEL
BUN: 21 mg/dL (ref 6–23)
Chloride: 110 mEq/L (ref 96–112)
Creatinine, Ser: 0.58 mg/dL (ref 0.50–1.35)
GFR calc Af Amer: >90 mL/min (ref >90)
Glucose, Bld: 192 mg/dL — ABNORMAL HIGH (ref 70–99)

## 2011-11-19 LAB — BASIC METABOLIC PANEL
BUN: 22 mg/dL (ref 6–23)
Chloride: 107 mEq/L (ref 96–112)
Creatinine, Ser: 0.58 mg/dL (ref 0.50–1.35)
GFR calc Af Amer: >90 mL/min (ref >90)
Glucose, Bld: 151 mg/dL — ABNORMAL HIGH (ref 70–99)

## 2011-11-20 LAB — CBC
HCT: 34.7 % — ABNORMAL LOW (ref 39.0–52.0)
Hemoglobin: 11.6 g/dL — ABNORMAL LOW (ref 13.0–17.0)
MCH: 30.9 pg (ref 26.0–34.0)
MCHC: 33.4 g/dL (ref 30.0–36.0)
MCV: 92.5 fL (ref 78.0–100.0)
RDW: 15.1 % (ref 11.5–15.5)

## 2011-11-20 LAB — BASIC METABOLIC PANEL
BUN: 22 mg/dL (ref 6–23)
Calcium: 9.4 mg/dL (ref 8.4–10.5)
Creatinine, Ser: 0.55 mg/dL (ref 0.50–1.35)
GFR calc Af Amer: >90 mL/min (ref >90)
GFR calc non Af Amer: >90 mL/min (ref >90)
Glucose, Bld: 146 mg/dL — ABNORMAL HIGH (ref 70–99)
Potassium: 3.2 mEq/L — ABNORMAL LOW (ref 3.5–5.1)

## 2011-11-21 ENCOUNTER — Other Ambulatory Visit (HOSPITAL_COMMUNITY): Payer: Medicare Other

## 2011-11-21 LAB — BASIC METABOLIC PANEL
BUN: 22 mg/dL (ref 6–23)
Calcium: 9.3 mg/dL (ref 8.4–10.5)
Creatinine, Ser: 0.61 mg/dL (ref 0.50–1.35)
GFR calc Af Amer: >90 mL/min (ref >90)
GFR calc non Af Amer: >90 mL/min (ref >90)

## 2011-11-22 LAB — BASIC METABOLIC PANEL
CO2: 25 mEq/L (ref 19–32)
Calcium: 9.6 mg/dL (ref 8.4–10.5)
Creatinine, Ser: 0.52 mg/dL (ref 0.50–1.35)
GFR calc non Af Amer: >90 mL/min (ref >90)
Sodium: 140 mEq/L (ref 135–145)

## 2011-11-23 LAB — VANCOMYCIN, TROUGH: Vancomycin Tr: 16.9 ug/mL (ref 10.0–20.0)

## 2011-11-26 NOTE — Discharge Summary (Signed)
Physician Discharge Summary  Patient ID: Gregory Humphrey MRN: [id number] DOB/AGE: 06/25/1941 71 y.o.  Admit date: 10/07/2011 Discharge date: 10/24/11   Discharge Diagnoses:  Principal Problem:  *ICH (intracerebral hemorrhage) Active Problems:  Essential hypertension, benign  Chronic respiratory failure  Hyperglycemia  Hypernatremia  Purulent bronchitis  Physical deconditioning   Lines / Drains:  11/27 ETT>>> 12/5  A line rt rad 11/28 >>>12/2  12/2 RUE PICC >>  12/5 #8 trach (DF) >>  PEG (Pyrtle) 12/06>>   Cultures / Sepsis markers:  BC x 2 11/28 > neg  Sputum 11/28 >>> strep pneumo (PCN sensitive)  Trach sputum 12/11: group F strep   Antibiotics: (concern CAP Vs asp)  Ceftriaxone 11/28 >>> 12/5  levoflox 11/28 >>12/2  Ceftriaxone 12/12 (purulent sputum/fever)>>>12/14  vanc 12/12 (purulent sputum/fever)>>>12/14  avelox 12/14 (strep)>>>   Tests / Events:  11/27 Found unresponsive. Head CT demonstrated large acute subdural, subarachnoid and parenchimal hemorrhage.  11/28- OR drain SDH  11/28- CT angio, no aneursym  11/28 Craniotomy for spontaneous SDH evacuation   Problems at discharge 1) Large acute subdural hemorrhage with herniation. Now s/p crani. Remains in Persistent vegetative state.  Rec:  -continue supportive care   2) Seizures risk  - keppra per neurology   3) Acute ventilator dependent respiratory failure due to neuro status. Now resolved but remains trach dependent.  Plan/rec:  -cont trach collar  -pulm hygiene  -not a candidate for decannulation  -stitches out 12/14   4)Purulent bronchitis - + group F strep  Plan:  -8 days of avelox, then D/C PICC   5) HTN emergency (resolved) BP OK off nicardipine  Plan:  -monitor   6) Nutrition  -- [facility], s/p PEG placement on 12/6  -- tolerating tube feeds   7) Hyperglycemia  CBG (last 3)   Basename  10/24/11 1221  10/24/11 0753  10/24/11 0515   GLUCAP  154*  167*  174*   plan  - SSI    -adjust lantus   10) Hypernatremia  Lab Results   Component  Value  Date    NA  149*  10/22/2011    NA  143  10/20/2011    NA  142  10/17/2011   plan:  -free water replacement        Discharge Orders    Future Orders Please Complete By Expires   Discharge instructions      Comments:   Remove PICC line after antibiotic course is complete.   Increase activity slowly      Discharge instructions      Comments:   RE Trach: Routine trach care per SNF protocol Gregory Humphrey was placed 12/5, due for change after 12/19 Inner cannula should be changed 3 times a day and as needed (usually with routine trach care) Clean stoma site daily Placed sterile trach dressing to site  RE PICC: Routine PICC care per SNF protocol Remove PICC after completes antibiotic course.        Discharge Medication List as of 10/24/2011  4:49 PM    START taking these medications   Details  docusate (COLACE) 50 MG/5ML liquid Place 5 mLs (50 mg total) into feeding tube 2 (two) times daily., Starting 10/24/2011, Until Mon 11/03/11, No Print    insulin aspart (NOVOLOG) 100 UNIT/ML injection Inject 0-9 Units into the skin every 4 (four) hours., Starting 10/24/2011, Until Sat 10/23/12, No Print    insulin glargine (LANTUS) 100 UNIT/ML injection Inject 5 Units into the skin at bedtime.,  Starting 10/24/2011, Until Sat 10/23/12, No Print    levETIRAcetam (KEPPRA) 100 MG/ML solution Take 7.5 mLs (750 mg total) by mouth every 12 (twelve) hours., Starting 10/24/2011, Until Sat 10/23/12, No Print    moxifloxacin (AVELOX) 400 MG/250ML IVPB Inject 250 mLs (400 mg total) into the vein daily., Starting 10/24/2011, Until Discontinued, No Print    Nutritional Supplements (FEEDING SUPPLEMENT, PROMOTE,) LIQD Place 1,000 mLs into feeding tube continuous., Starting 10/24/2011, Until Discontinued, No Print    pantoprazole sodium (PROTONIX) 40 mg/20 mL PACK Place 20 mLs (40 mg total) into feeding tube daily., Starting  10/24/2011, Until Discontinued, No Print    sennosides (SENOKOT) 8.8 MG/5ML syrup Place 5 mLs into feeding tube 2 (two) times daily., Starting 10/24/2011, Until Mon 11/03/11, No Print    sodium chloride 0.9 % injection 10 mLs by Intracatheter route as needed (flush)., Starting 10/24/2011, Until Discontinued, No Print    Water For Irrigation, Sterile (FREE WATER) SOLN Place 200 mLs into feeding tube every 6 (six) hours., Starting 10/24/2011, Until Discontinued, No Print      STOP taking these medications     Alpha-Lipoic Acid 200 MG TABS      Ascorbic Acid (VITAMIN C) 1000 MG tablet      Astaxanthin 4 MG CAPS      Calcium Carb-Cholecalciferol (CALCIUM 1000 + D PO)      Coenzyme Q10 (COQ-10) 200 MG CAPS      Cyanocobalamin (B-12) 2500 MCG TABS      DHEA 50 MG CAPS      Flaxseed, Linseed, (FLAX SEEDS PO)      folic acid (FOLVITE) 800 MCG tablet      Garlic Oil 1000 MG CAPS      Ginkgo Biloba 120 MG CAPS      Hyaluronic Acid-Vitamin C (HYALURONIC ACID PO)      Magnesium 400 MG CAPS      Melatonin 5 MG CAPS      Omega-3 Fatty Acids (FISH OIL) 1000 MG CAPS      Pregnenolone Micronized POWD      pyridOXINE (VITAMIN B-6) 100 MG tablet      Specialty Vitamins Products (BILBERRY PLUS PO)      triamterene-hydrochlorothiazide (MAXZIDE-25) 37.5-25 MG per tablet      Vitamin Mixture (VITAMIN E-SELENIUM) 400-50 UNIT-MCG CAPS      pantoprazole (PROTONIX) 40 MG injection        Follow-up Information    Follow up with Gates Rigg, MD. Make an appointment in 2 months.   Contact information:   [address], [address] [facility] [place] [address] [phone]          Discharged Condition: Gregory Humphrey dep/ peg dep  Signed: Sandrea Hughs 11/26/2011, 8:22 PM

## 2011-11-27 LAB — CBC
MCH: 30.6 pg (ref 26.0–34.0)
MCV: 90.9 fL (ref 78.0–100.0)
Platelets: 155 10*3/uL (ref 150–400)
RBC: 3.85 MIL/uL — ABNORMAL LOW (ref 4.22–5.81)
RDW: 15.5 % (ref 11.5–15.5)
WBC: 4.2 10*3/uL (ref 4.0–10.5)

## 2011-11-27 LAB — ALBUMIN: Albumin: 2.6 g/dL — ABNORMAL LOW (ref 3.5–5.2)

## 2011-11-27 LAB — DIFFERENTIAL
Basophils Absolute: 0 10*3/uL (ref 0.0–0.1)
Eosinophils Absolute: 0.2 10*3/uL (ref 0.0–0.7)
Eosinophils Relative: 6 % — ABNORMAL HIGH (ref 0–5)
Lymphocytes Relative: 21 % (ref 12–46)
Lymphs Abs: 0.9 10*3/uL (ref 0.7–4.0)
Neutrophils Relative %: 63 % (ref 43–77)

## 2011-11-27 LAB — BASIC METABOLIC PANEL
CO2: 28 mEq/L (ref 19–32)
Calcium: 9.5 mg/dL (ref 8.4–10.5)
Creatinine, Ser: 0.44 mg/dL — ABNORMAL LOW (ref 0.50–1.35)
GFR calc non Af Amer: >90 mL/min (ref >90)
Sodium: 138 mEq/L (ref 135–145)

## 2011-11-27 LAB — MAGNESIUM: Magnesium: 2.1 mg/dL (ref 1.5–2.5)

## 2011-11-29 LAB — BASIC METABOLIC PANEL
BUN: 23 mg/dL (ref 6–23)
Calcium: 10.1 mg/dL (ref 8.4–10.5)
Creatinine, Ser: 0.53 mg/dL (ref 0.50–1.35)
GFR calc Af Amer: >90 mL/min (ref >90)
GFR calc non Af Amer: >90 mL/min (ref >90)

## 2011-11-29 LAB — CLOSTRIDIUM DIFFICILE BY PCR: Toxigenic C. Difficile by PCR: NEGATIVE

## 2011-11-30 LAB — CULTURE, BLOOD (ROUTINE X 2): Culture  Setup Time: 201301202106

## 2011-11-30 LAB — URINE CULTURE: Culture  Setup Time: 201301201809

## 2011-11-30 LAB — CULTURE, RESPIRATORY W GRAM STAIN: Gram Stain: NONE SEEN

## 2011-11-30 LAB — BASIC METABOLIC PANEL
BUN: 26 mg/dL — ABNORMAL HIGH (ref 6–23)
Calcium: 9.5 mg/dL (ref 8.4–10.5)
Creatinine, Ser: 0.51 mg/dL (ref 0.50–1.35)
GFR calc Af Amer: >90 mL/min (ref >90)
GFR calc non Af Amer: >90 mL/min (ref >90)

## 2011-12-01 ENCOUNTER — Other Ambulatory Visit (HOSPITAL_COMMUNITY): Payer: Medicare Other

## 2011-12-01 LAB — CBC
MCH: 30.6 pg (ref 26.0–34.0)
MCHC: 33.1 g/dL (ref 30.0–36.0)
MCV: 92.4 fL (ref 78.0–100.0)
Platelets: 181 10*3/uL (ref 150–400)
RBC: 3.53 MIL/uL — ABNORMAL LOW (ref 4.22–5.81)

## 2011-12-02 LAB — CBC
HCT: 33 % — ABNORMAL LOW (ref 39.0–52.0)
MCHC: 33.3 g/dL (ref 30.0–36.0)
MCV: 92.4 fL (ref 78.0–100.0)
RDW: 15.1 % (ref 11.5–15.5)

## 2011-12-03 ENCOUNTER — Other Ambulatory Visit (HOSPITAL_COMMUNITY): Payer: Medicare Other

## 2011-12-03 LAB — VANCOMYCIN, TROUGH: Vancomycin Tr: 6.1 ug/mL — ABNORMAL LOW (ref 10.0–20.0)

## 2011-12-03 LAB — TOBRAMYCIN LEVEL, TROUGH: Tobramycin Tr: 2.6 ug/mL (ref 0.5–2.0)

## 2011-12-04 LAB — TOBRAMYCIN LEVEL, RANDOM: Tobramycin Rm: 0.4 ug/mL

## 2011-12-04 LAB — TOBRAMYCIN LEVEL, TROUGH: Tobramycin Tr: 0.4 ug/mL — ABNORMAL LOW (ref 0.5–2.0)

## 2011-12-05 LAB — BASIC METABOLIC PANEL
BUN: 20 mg/dL (ref 6–23)
Calcium: 9.8 mg/dL (ref 8.4–10.5)
Creatinine, Ser: 0.53 mg/dL (ref 0.50–1.35)
GFR calc Af Amer: >90 mL/min (ref >90)
GFR calc non Af Amer: >90 mL/min (ref >90)
Glucose, Bld: 175 mg/dL — ABNORMAL HIGH (ref 70–99)
Potassium: 3.8 mEq/L (ref 3.5–5.1)

## 2011-12-05 LAB — CBC
MCH: 31.3 pg (ref 26.0–34.0)
MCHC: 35 g/dL (ref 30.0–36.0)
Platelets: 237 10*3/uL (ref 150–400)
RDW: 14.8 % (ref 11.5–15.5)

## 2011-12-06 LAB — TOBRAMYCIN LEVEL, RANDOM: Tobramycin Rm: 0.6 ug/mL

## 2011-12-06 LAB — CULTURE, BLOOD (ROUTINE X 2): Culture  Setup Time: 201301202106

## 2011-12-08 ENCOUNTER — Encounter: Payer: Self-pay | Admitting: Internal Medicine

## 2011-12-08 LAB — TOBRAMYCIN LEVEL, TROUGH: Tobramycin Tr: 2.4 ug/mL (ref 0.5–2.0)

## 2011-12-11 LAB — BASIC METABOLIC PANEL
BUN: 21 mg/dL — ABNORMAL HIGH (ref 7–18)
Calcium, Total: 9.3 mg/dL (ref 8.5–10.1)
EGFR (African American): >60
EGFR (Non-African Amer.): >60
Glucose: 133 mg/dL — ABNORMAL HIGH (ref 65–99)
Osmolality: 286 (ref 275–301)
Potassium: 3.6 mmol/L (ref 3.5–5.1)

## 2011-12-11 LAB — CBC WITH DIFFERENTIAL/PLATELET
Basophil %: 0.2 %
Eosinophil %: 2.1 %
HCT: 35.3 % — ABNORMAL LOW (ref 40.0–52.0)
HGB: 12.6 g/dL — ABNORMAL LOW (ref 13.0–18.0)
Lymphocyte #: 0.9 10*3/uL — ABNORMAL LOW (ref 1.0–3.6)
MCH: 33 pg (ref 26.0–34.0)
MCHC: 35.7 g/dL (ref 32.0–36.0)
MCV: 92 fL (ref 80–100)
Monocyte #: 0.5 10*3/uL (ref 0.0–0.7)
Neutrophil #: 4.8 10*3/uL (ref 1.4–6.5)
Neutrophil %: 74.2 %
RBC: 3.82 10*6/uL — ABNORMAL LOW (ref 4.40–5.90)

## 2011-12-11 LAB — ALBUMIN: Albumin: 2.7 g/dL — ABNORMAL LOW (ref 3.4–5.0)

## 2011-12-14 ENCOUNTER — Emergency Department: Payer: Self-pay | Admitting: Internal Medicine

## 2011-12-16 LAB — CULTURE, BLOOD (SINGLE)

## 2011-12-26 ENCOUNTER — Encounter: Payer: Self-pay | Admitting: Internal Medicine

## 2011-12-26 LAB — URINALYSIS, COMPLETE
Glucose,UR: NEGATIVE mg/dL (ref 0–75)
Ketone: NEGATIVE
Leukocyte Esterase: NEGATIVE
Nitrite: NEGATIVE
RBC,UR: 2 /HPF (ref 0–5)
Specific Gravity: 1.006 (ref 1.003–1.030)

## 2012-01-05 ENCOUNTER — Emergency Department: Payer: Self-pay | Admitting: Emergency Medicine

## 2012-01-05 ENCOUNTER — Telehealth: Payer: Self-pay | Admitting: Internal Medicine

## 2012-01-06 NOTE — Telephone Encounter (Signed)
Dr Rhea Belton placed a feeding tube in pt in December, 2012 after a massive CVA. Wife, Gregory Humphrey reports they gave pt no hope, but now he has progressed to using a walker and his diet has advanced to regular food by mouth; feeding tube hasn't been used since 12/30/11 for meds or anything. Pt's weight is stable. He is scheduled for a Barium Swallow on 01/09/12. Wife wants to know if feeding tube can be removed and if so, what's involved? Cell [phone]   Work [phone] Spoke with Dr Rhea Belton who stated he would like to see the results of the Barium Swallow and if ok, he will remove the tube in the ofc with a 30 minute appt.  Gregory Humphrey will have [employer] [place] a copy of the BS results. Scheduled the appt for tube removal for 01/26/12 at 1:30pm. Dr Rhea Belton is hospital doc the week before so he can't do it then. Pt can eat breakfast, but must be NPO 2 hours prior to the procedure. Reminder in to look for BS results beginning 01/16/12. Wife stated understanding.

## 2012-01-09 ENCOUNTER — Ambulatory Visit: Payer: Self-pay | Admitting: Internal Medicine

## 2012-01-12 ENCOUNTER — Encounter: Payer: Self-pay | Admitting: Internal Medicine

## 2012-01-12 LAB — URINALYSIS, COMPLETE
Blood: NEGATIVE
Ketone: NEGATIVE
Nitrite: NEGATIVE
Protein: NEGATIVE
Specific Gravity: 1.01 (ref 1.003–1.030)
WBC UR: 2 /HPF (ref 0–5)

## 2012-01-15 ENCOUNTER — Telehealth: Payer: Self-pay | Admitting: Internal Medicine

## 2012-01-15 NOTE — Telephone Encounter (Signed)
Wife wanted to know if we received a copy of pt's swallowing study. Found the report on his desk and I will send it to him to review. Verified pt's appt time with wife. We will let her know if there is a problem.

## 2012-01-16 NOTE — Telephone Encounter (Signed)
Yes for that morning

## 2012-01-16 NOTE — Telephone Encounter (Signed)
Spoke with pts wife and she is aware. Wife wants to know if her husband needs to be NPO for the peg removal on the 18th. Please advise.  Spoke with pts wife and Dr. Rhea Belton. Wife states pt would have a hard time being NPO after midnight. Instructed them that he could have clear liquids up until 2 hours prior to his appointment. Wife verbalized understanding.

## 2012-01-16 NOTE — Telephone Encounter (Signed)
I reviewed the speech and swallow evaluation from [facility] It appears he is cleared for mechanical soft diet with thin liquids. Given this, we will plan to proceed with PEG removal as scheduled for March 18

## 2012-01-21 ENCOUNTER — Encounter: Payer: Self-pay | Admitting: Internal Medicine

## 2012-01-26 ENCOUNTER — Ambulatory Visit (INDEPENDENT_AMBULATORY_CARE_PROVIDER_SITE_OTHER): Payer: Medicare Other | Admitting: Internal Medicine

## 2012-01-26 ENCOUNTER — Encounter: Payer: Self-pay | Admitting: Internal Medicine

## 2012-01-26 VITALS — BP 130/60 | HR 68 | Ht 70.0 in | Wt 171.0 lb

## 2012-01-26 DIAGNOSIS — Z431 Encounter for attention to gastrostomy: Secondary | ICD-10-CM

## 2012-01-26 DIAGNOSIS — I619 Nontraumatic intracerebral hemorrhage, unspecified: Secondary | ICD-10-CM

## 2012-01-26 NOTE — Progress Notes (Signed)
Subjective:    Patient ID: Gregory Humphrey, male    DOB: 04/12/1941, 71 y.o.   MRN: [id number]  HPI Gregory Humphrey is a remarkable 71 yo male with a complicated recent PMH and prolonged hospitalization for an intrarcranial hemorrhage due to ruptured AV malformation who returns today to have his PEG tube removed. He is accompanied today, by his wife whom I met during his hospital stay.  His PEG was placed by me, endoscopically, on 10/16/2011. At that time, the patient was unresponsive and status post tracheostomy in the neuro ICU. At that time little hope was given to recovery, however he somehow has made an impressive recovery and he is getting ready to be discharged [place]. He has regained full consciousness, the tracheostomy has been removed, and he is now eating a regular diet. He is no longer using the PEG tube at all. He has had no trouble with the PEG, including no abdominal pain or breakdown at the skin site. They have been flushing the PEG at his rehabilitation center.   He requests today the PEG be removed. He denies nausea, vomiting, heartburn. No dysphagia or odynophagia.  Bowel movements have returned to near normal for him without bleeding or melena.  Review of Systems As per history of present illness, otherwise negative  Past Medical History  Diagnosis Date  . Hypertension   . Gilbert's syndrome   . Kidney stones   . Olecranon bursitis   - intracerebral hemorrhage with prolonged rehabilitation  Current Outpatient Prescriptions  Medication Sig Dispense Refill  . acetaminophen (TYLENOL) 160 MG/5ML elixir Place 650 mg into feeding tube every 6 (six) hours.        . Chlorhexidine Gluconate Cloth 2 % PADS Apply 6 each topically daily at 6 (six) AM.  5 each  0  . cloNIDine (CATAPRES) 0.1 MG tablet Take 0.1 mg by mouth every 6 (six) hours.      . diphenhydrAMINE (SOMINEX) 25 MG tablet Take 25 mg by mouth every 4 (four) hours as needed.       . docusate (COLACE) 50 MG/5ML liquid Place 5 mLs (50 mg total) into feeding tube 2 (two) times daily.  100 mL  0  . donepezil (ARICEPT) 5 MG tablet Take 5 mg by mouth at bedtime as needed.      Marland Kitchen glipiZIDE (GLUCOTROL XL) 2.5 MG 24 hr tablet Take 2.5 mg by mouth daily. 1/2 tab daily      . hydrALAZINE (APRESOLINE) 50 MG tablet Take 50 mg by mouth every 8 (eight) hours.      . LamoTRIgine (LAMICTAL PO) Take by mouth. 75 mg daily      . levETIRAcetam (KEPPRA) 100 MG/ML solution Place 750 mg into feeding tube 2 (two) times daily.       Marland Kitchen LORazepam (ATIVAN) 0.5 MG tablet Take 0.5 mg by mouth every 8 (eight) hours.      . metoprolol tartrate (LOPRESSOR) 25 MG tablet Take 12.5 mg by mouth 2 (two) times daily.      . Nutritional Supplements (FEEDING SUPPLEMENT, PROMOTE,) LIQD Place 1,000 mLs into feeding tube continuous.      . pantoprazole sodium (PROTONIX) 40 mg/20 mL PACK Place 40 mg into feeding tube daily.        . Water For Irrigation, Sterile (FREE WATER) SOLN Place 250 mLs into feeding tube every 6 (six) hours.      Marland Kitchen zolpidem (AMBIEN) 5 MG tablet Take 5 mg by  mouth at bedtime as needed.      . insulin aspart (NOVOLOG) 100 UNIT/ML injection Inject 3 Units into the skin every 4 (four) hours.  1 vial  0  . insulin glargine (LANTUS) 100 UNIT/ML injection Inject 8 Units into the skin at bedtime.        No Known Allergies  Family History  Problem Relation Age of Onset  . Stroke Mother   . Hypertension Mother   . Diabetes Mother   . Cancer Father     bone  . Alcohol abuse Brother     past  . Stroke Maternal Grandfather    SH - see history of present illness, no tobacco, illicit drug use, or alcohol     Objective:   Physical Exam BP 130/60  Pulse 68  Ht 5\' 10"  (1.778 m)  Wt 171 lb (77.565 kg)  BMI 24.54 kg/m2 Constitutional: Well-developed and well-nourished. No distress. HEENT: Normocephalic and atraumatic. Oropharynx is clear and moist. No oropharyngeal exudate. Conjunctivae  are normal. Pupils are equal round and reactive to light. No scleral icterus. Neck: Neck supple. Trachea midline. Cardiovascular: Normal rate, regular rhythm and intact distal pulses. No M/R/G Pulmonary/chest: Effort normal and breath sounds normal. No wheezing, rales or rhonchi. Abdominal: Soft, nontender, nondistended. Bowel sounds active throughout. There are no masses palpable. No hepatosplenomegaly. PEG tube in place without surrounding erythema or induration Extremities: no clubbing, cyanosis, or edema Neurological: Alert and oriented to person place and time, moves all 4 extremities Skin: Skin is warm and dry. No rashes noted.  CBC    Component Value Date/Time   WBC 5.6 12/05/2011 0558   RBC 3.87* 12/05/2011 0558   HGB 12.1* 12/05/2011 0558   HCT 34.6* 12/05/2011 0558   PLT 237 12/05/2011 0558   MCV 89.4 12/05/2011 0558   MCH 31.3 12/05/2011 0558   MCHC 35.0 12/05/2011 0558   RDW 14.8 12/05/2011 0558   LYMPHSABS 0.9 11/27/2011 0525   MONOABS 0.4 11/27/2011 0525   EOSABS 0.2 11/27/2011 0525   BASOSABS 0.0 11/27/2011 0525      Assessment & Plan:  Gregory Humphrey is a remarkable 71 yo male with a complicated recent PMH and prolonged hospitalization for an intrarcranial hemorrhage due to ruptured AV malformation who returns today to have his PEG tube removed  1. PEG removal -- the patient has made a incredible recovery from a neurologic standpoint, and no longer needs his PEG tube. The tube was removed at the bedside with no discomfort or complication. The PEG site was covered with gauze and secured with paper tape. I have advised that they can continue to change the dressing and replace with dry gauze as needed. He can apply triple antibiotic ointment to the PEG site until it heals completely. He can resume his regular diet today. Followup can be as needed.

## 2012-01-26 NOTE — Patient Instructions (Signed)
Follow up as needed

## 2012-02-09 ENCOUNTER — Encounter: Payer: Self-pay | Admitting: Internal Medicine

## 2012-03-09 ENCOUNTER — Encounter: Payer: Self-pay | Admitting: Internal Medicine

## 2012-03-10 ENCOUNTER — Encounter: Payer: Self-pay | Admitting: Internal Medicine

## 2012-04-10 ENCOUNTER — Encounter: Payer: Self-pay | Admitting: Internal Medicine

## 2012-05-10 ENCOUNTER — Encounter: Payer: Self-pay | Admitting: Internal Medicine

## 2013-06-26 IMAGING — CR DG CHEST 1V PORT
2 series · 2 of 2 positions shown · non-contrast
Comparison: 10/07/2011

CLINICAL DATA: Intubation, ventilatory support

PORTABLE CHEST - 1 VIEW

[view not recorded (1 of 2)]
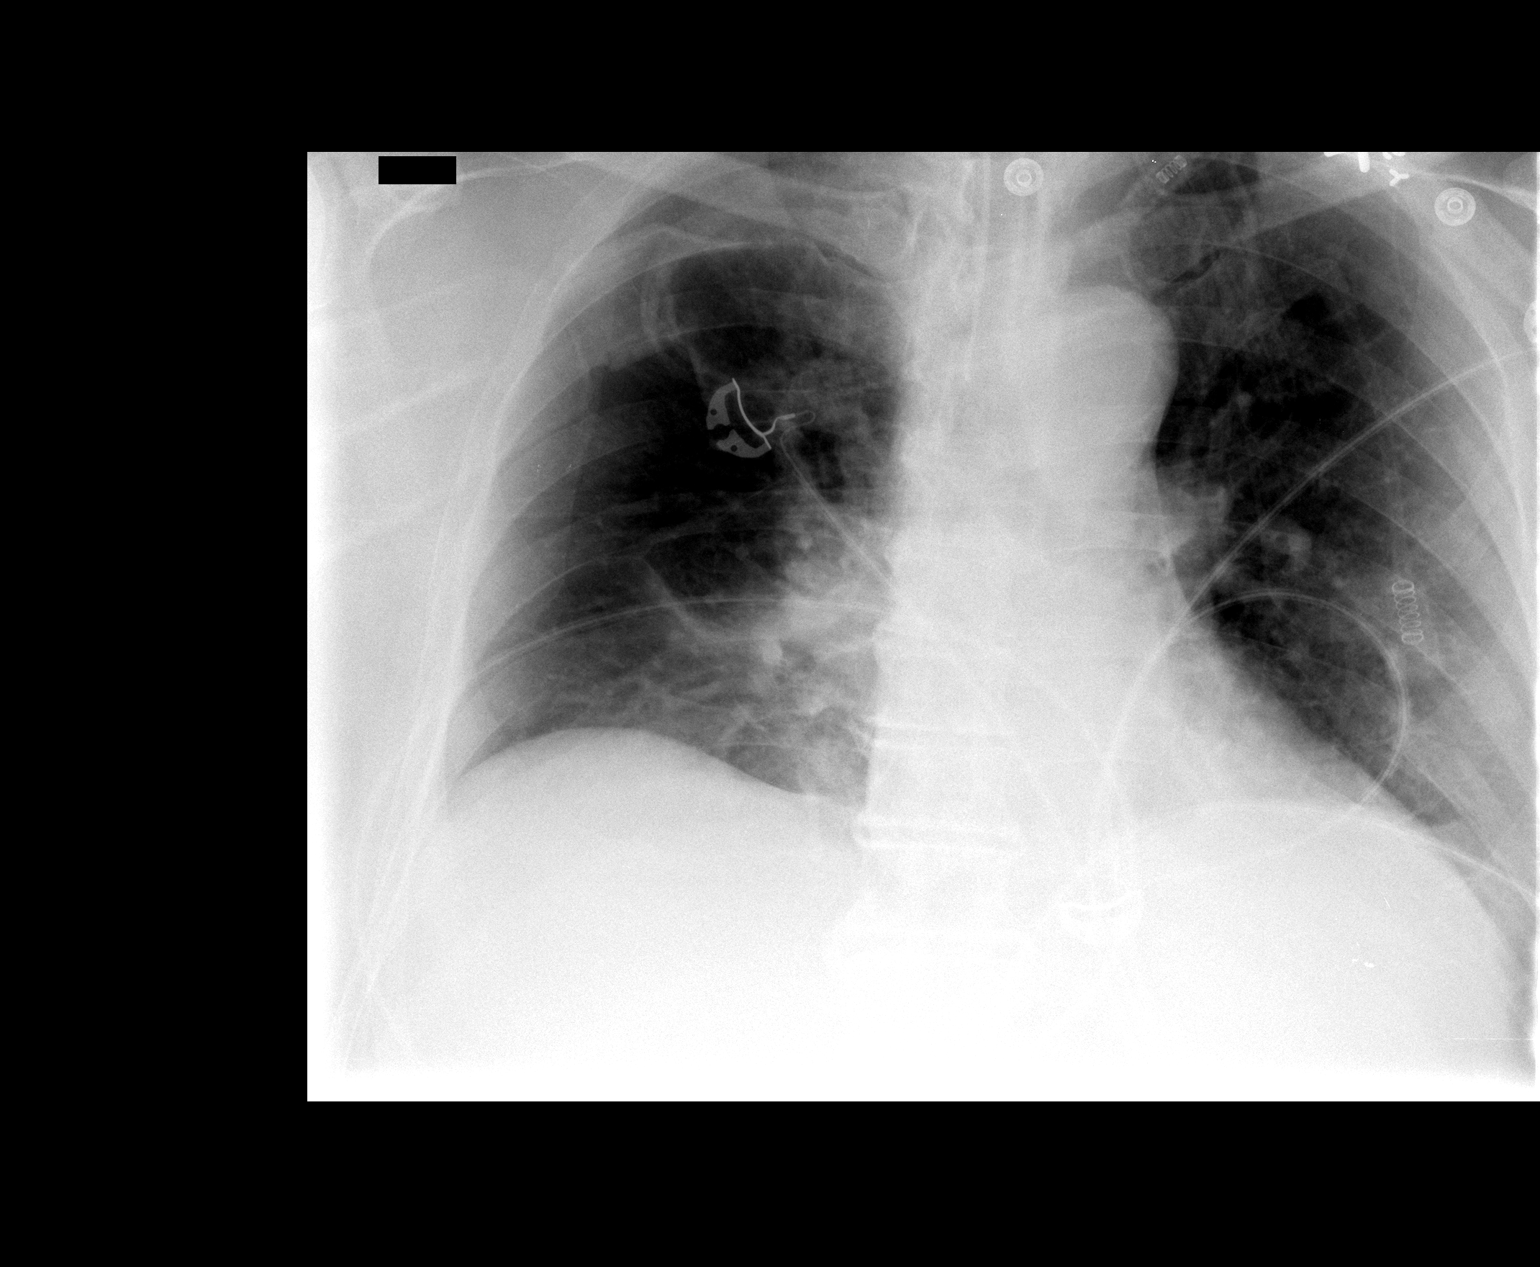

[view not recorded (2 of 2)]
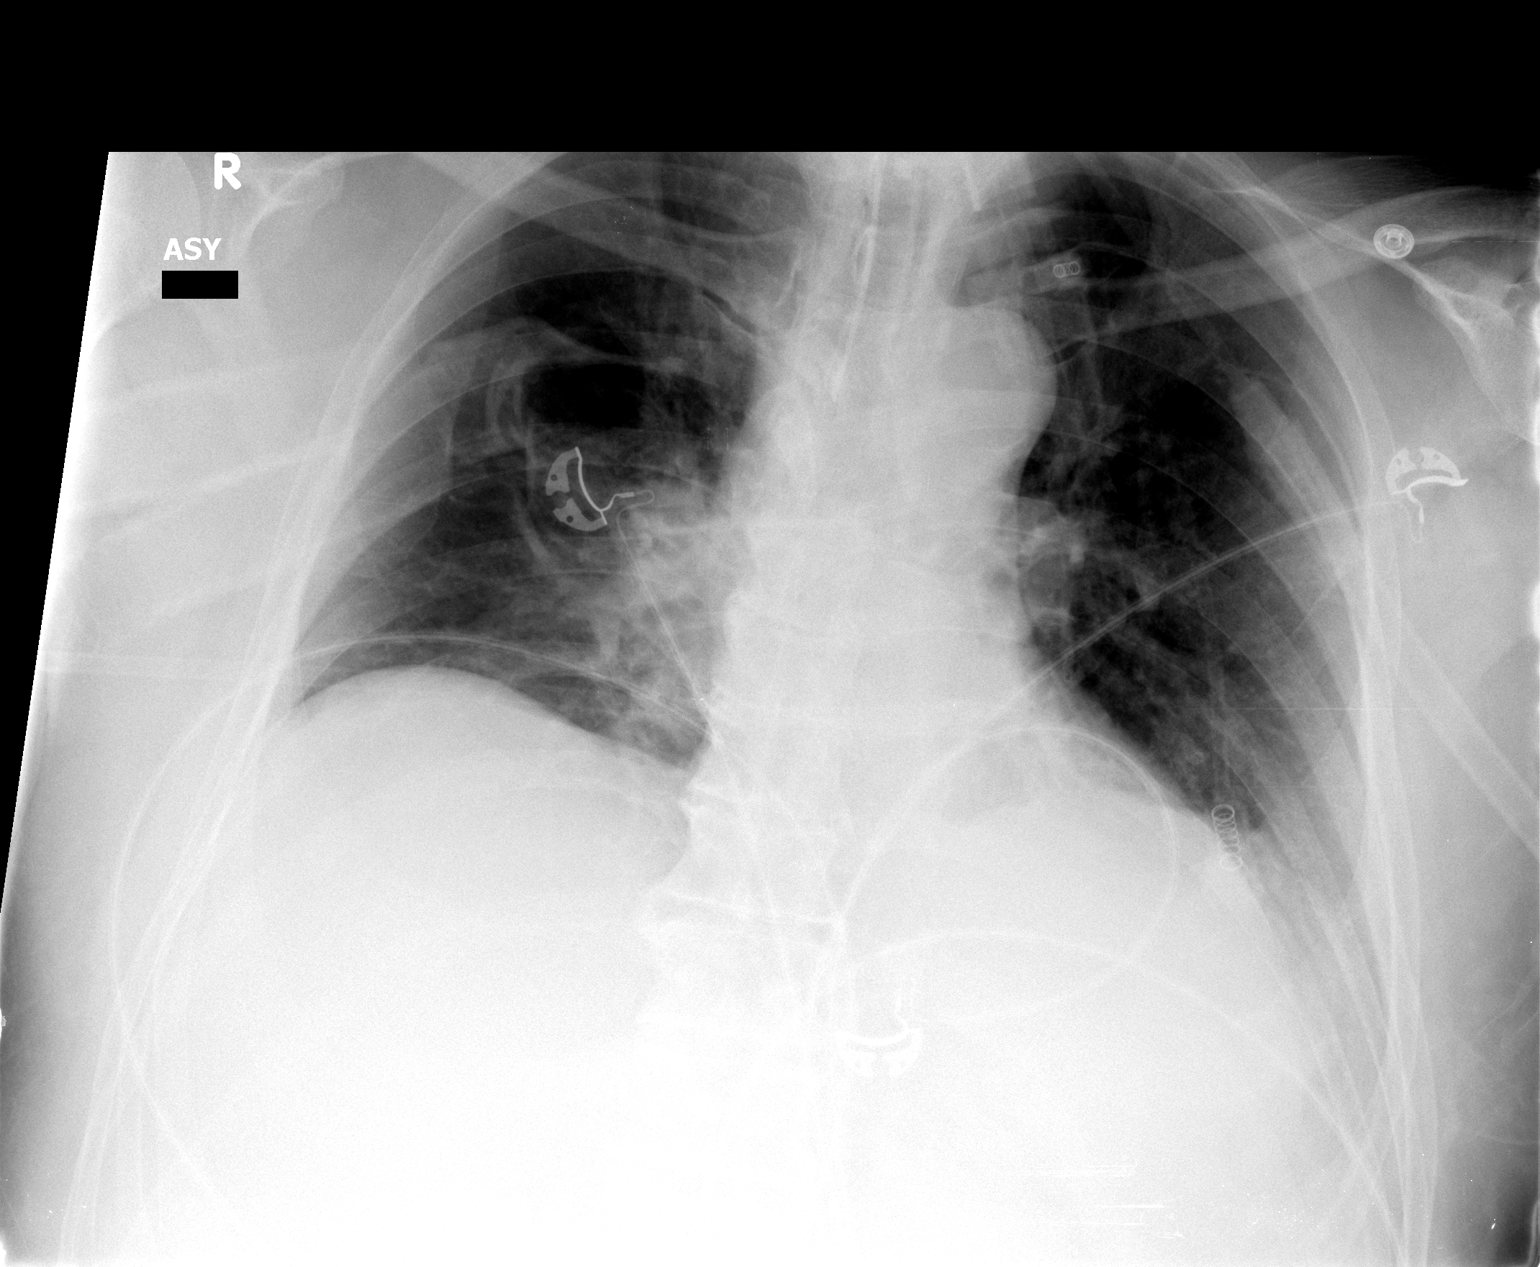

[2 of 2 positions shown; findings below may reference images not displayed]

FINDINGS: Endotracheal tube 3.4 cm above the carina.  Decreased
lung volumes persist with central vascular congestion and bibasilar
atelectasis.  Left costophrenic angle excluded on the film.  No
enlarging effusion or pneumothorax.  No significant interval
change.  Degenerative changes of the spine.
IMPRESSION: Stable portable chest exam.

## 2013-06-27 IMAGING — CR DG CHEST 1V PORT
1 series · 1 of 1 positions shown · non-contrast
Comparison: 10/09/2011

CLINICAL DATA: Endotracheal tube placement

PORTABLE CHEST - 1 VIEW

[view not recorded]
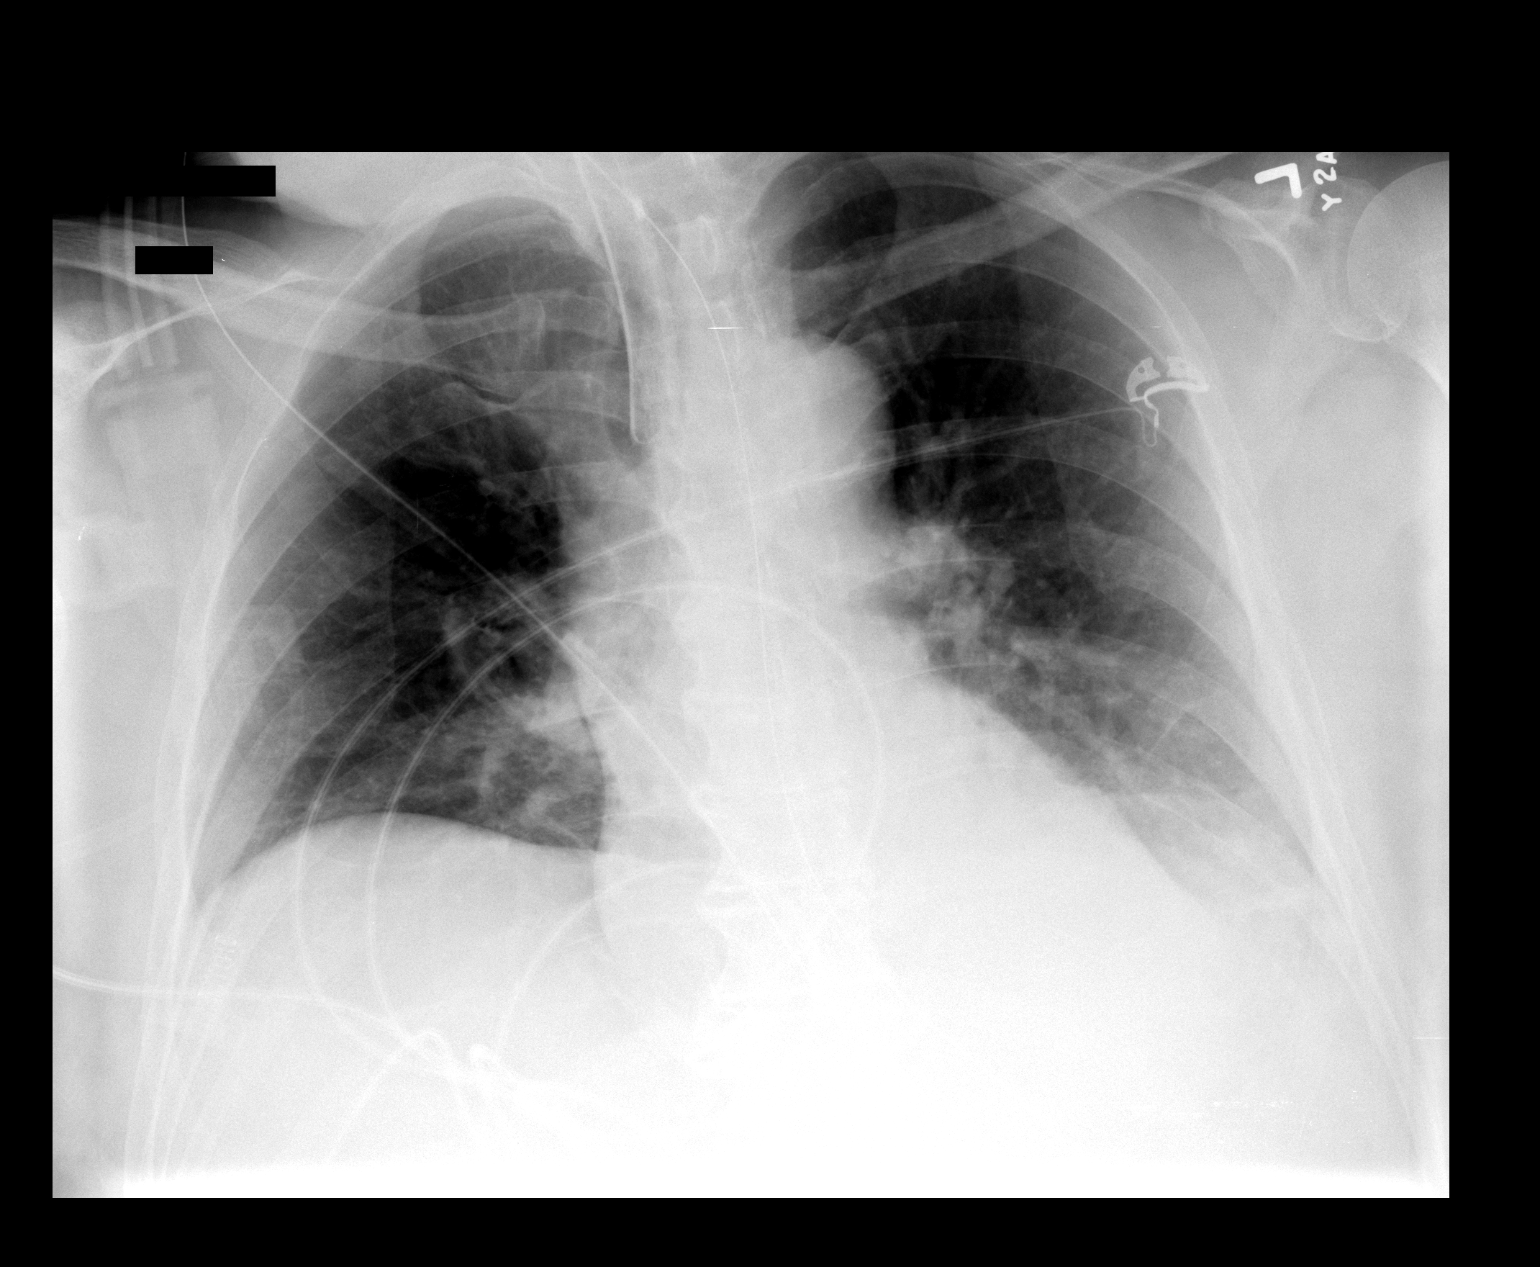

[1 of 1 positions shown; findings below may reference images not displayed]

FINDINGS: Endotracheal and orogastric tubes appropriately
positioned. Bilateral lower lobe atelectasis with small left
pleural effusion again noted.    Heart size is normal.  Cardiac
leads obscure detail.
IMPRESSION: No significant change in the left lower lobe small effusion and
compressive atelectasis versus superimposed pneumonia.

## 2013-06-28 IMAGING — CR DG CHEST 1V PORT
1 series · 1 of 1 positions shown · non-contrast
Comparison: October 10, 2011

CLINICAL DATA: Respiratory failure and shortness of breath

PORTABLE CHEST - 1 VIEW

[AP]
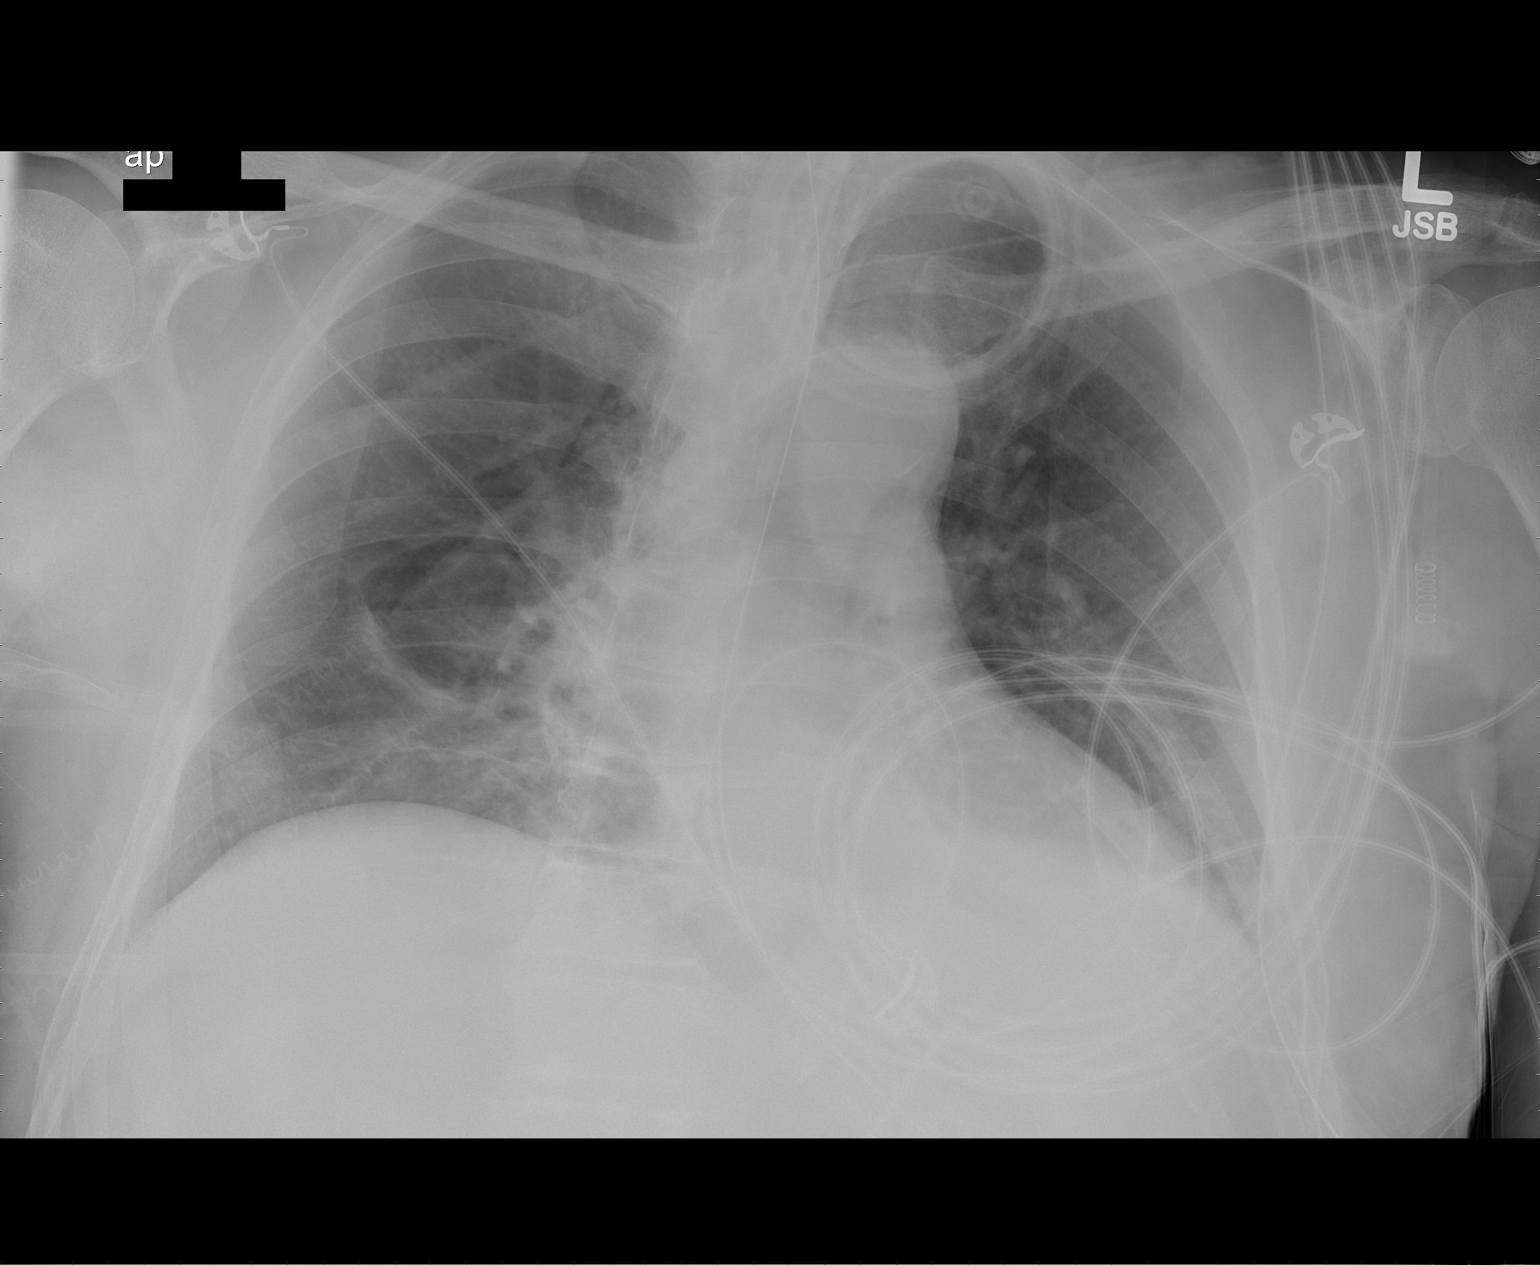

[1 of 1 positions shown; findings below may reference images not displayed]

FINDINGS: The ET tube tip remains well above the carina.  The
nasogastric tube courses off the inferior film.  Left lower lobe
opacity appears slightly improved.  Plate-like atelectasis in the
right lower lung remains present.
IMPRESSION: Slight interval improvement in appearance of left lower lobe
opacity.

## 2013-06-30 IMAGING — CR DG CHEST 1V PORT
1 series · 1 of 1 positions shown · non-contrast
Comparison: Prior films same day

CLINICAL DATA: PICC line placement

PORTABLE CHEST - 1 VIEW

[AP]
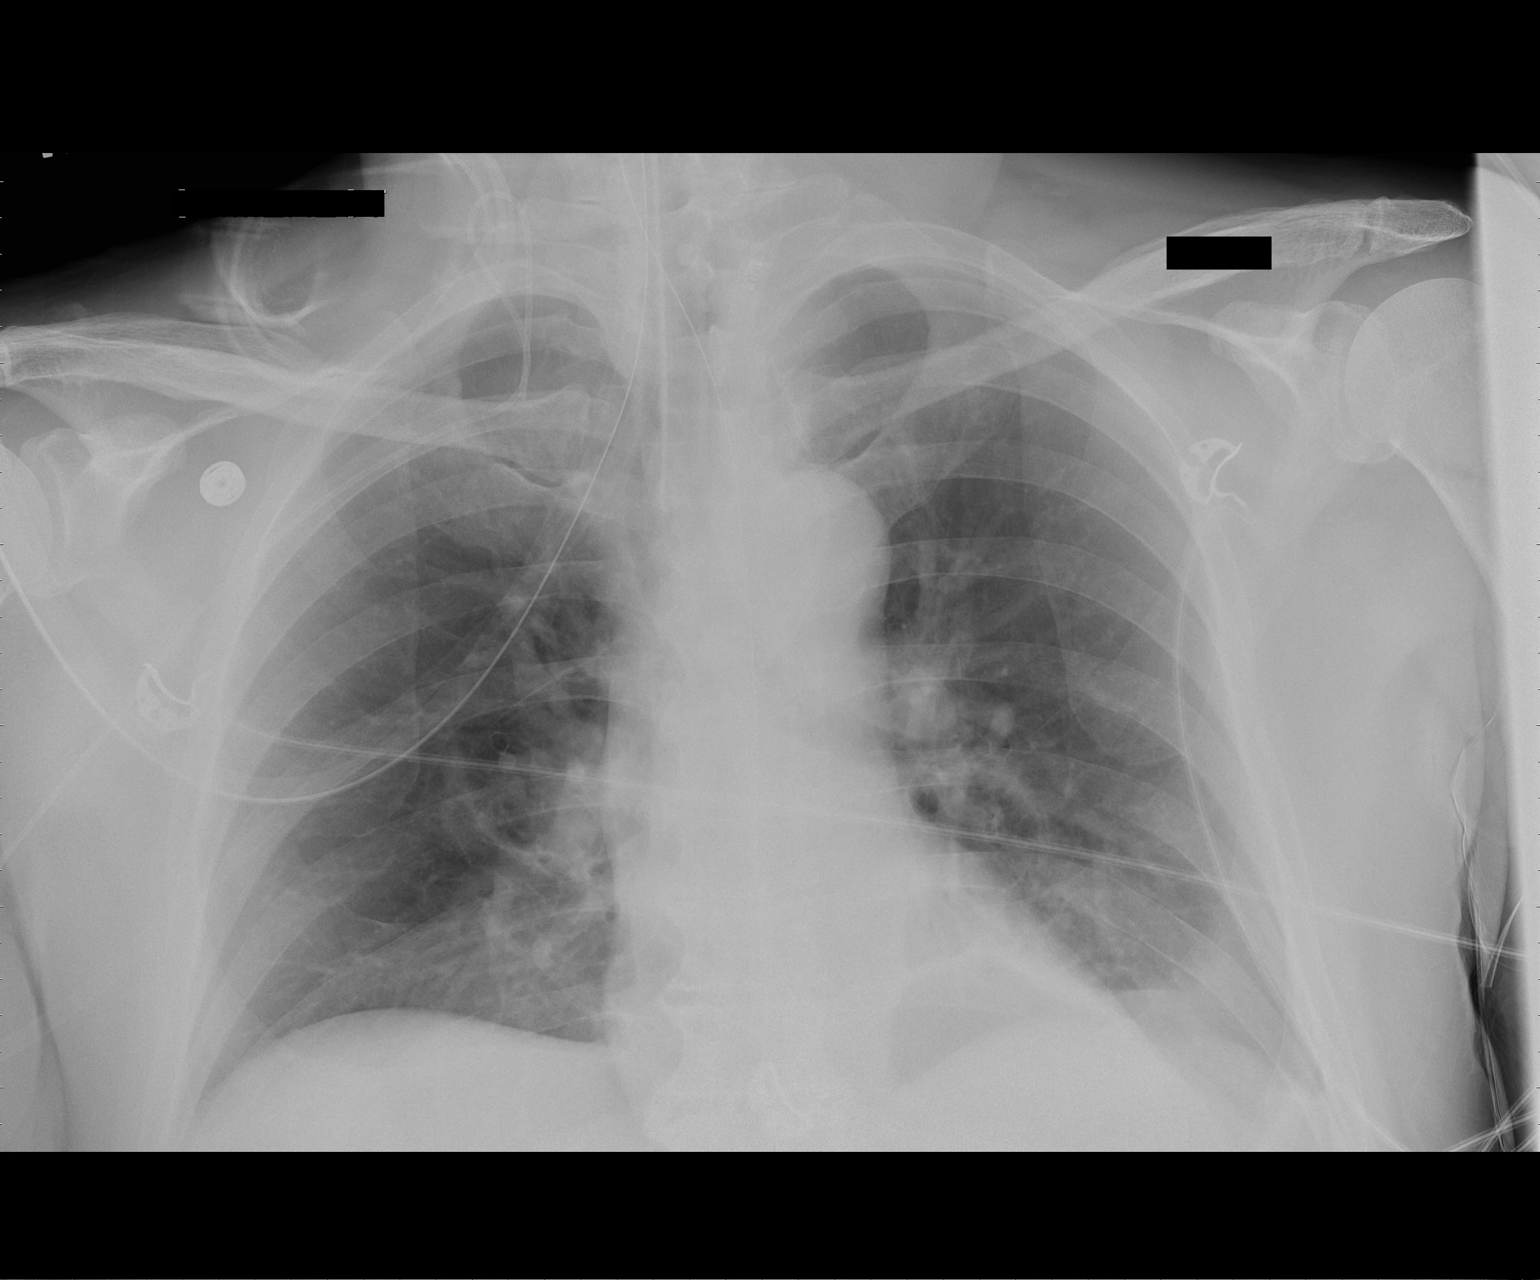

[1 of 1 positions shown; findings below may reference images not displayed]

FINDINGS: Endotracheal tube in place with tip 4 cm above the
carina.  Again noted malposition of the right PICC line with the
tip looping in the right jugular vein.  Probable small hernia or
tenting of the left hemidiaphragm.  No diagnostic pneumothorax.
IMPRESSION: Again noted malposition of the right PICC line with the tip
looping in the right jugular vein.  Probable small hernia or
tenting of the left hemidiaphragm.  No diagnostic pneumothorax.

## 2013-07-01 IMAGING — CR DG CHEST 1V PORT
1 series · 1 of 1 positions shown · non-contrast
Comparison: Prior films same day

CLINICAL DATA: Tracheostomy tube placement, rule out pneumothorax

PORTABLE CHEST - 1 VIEW

[AP]
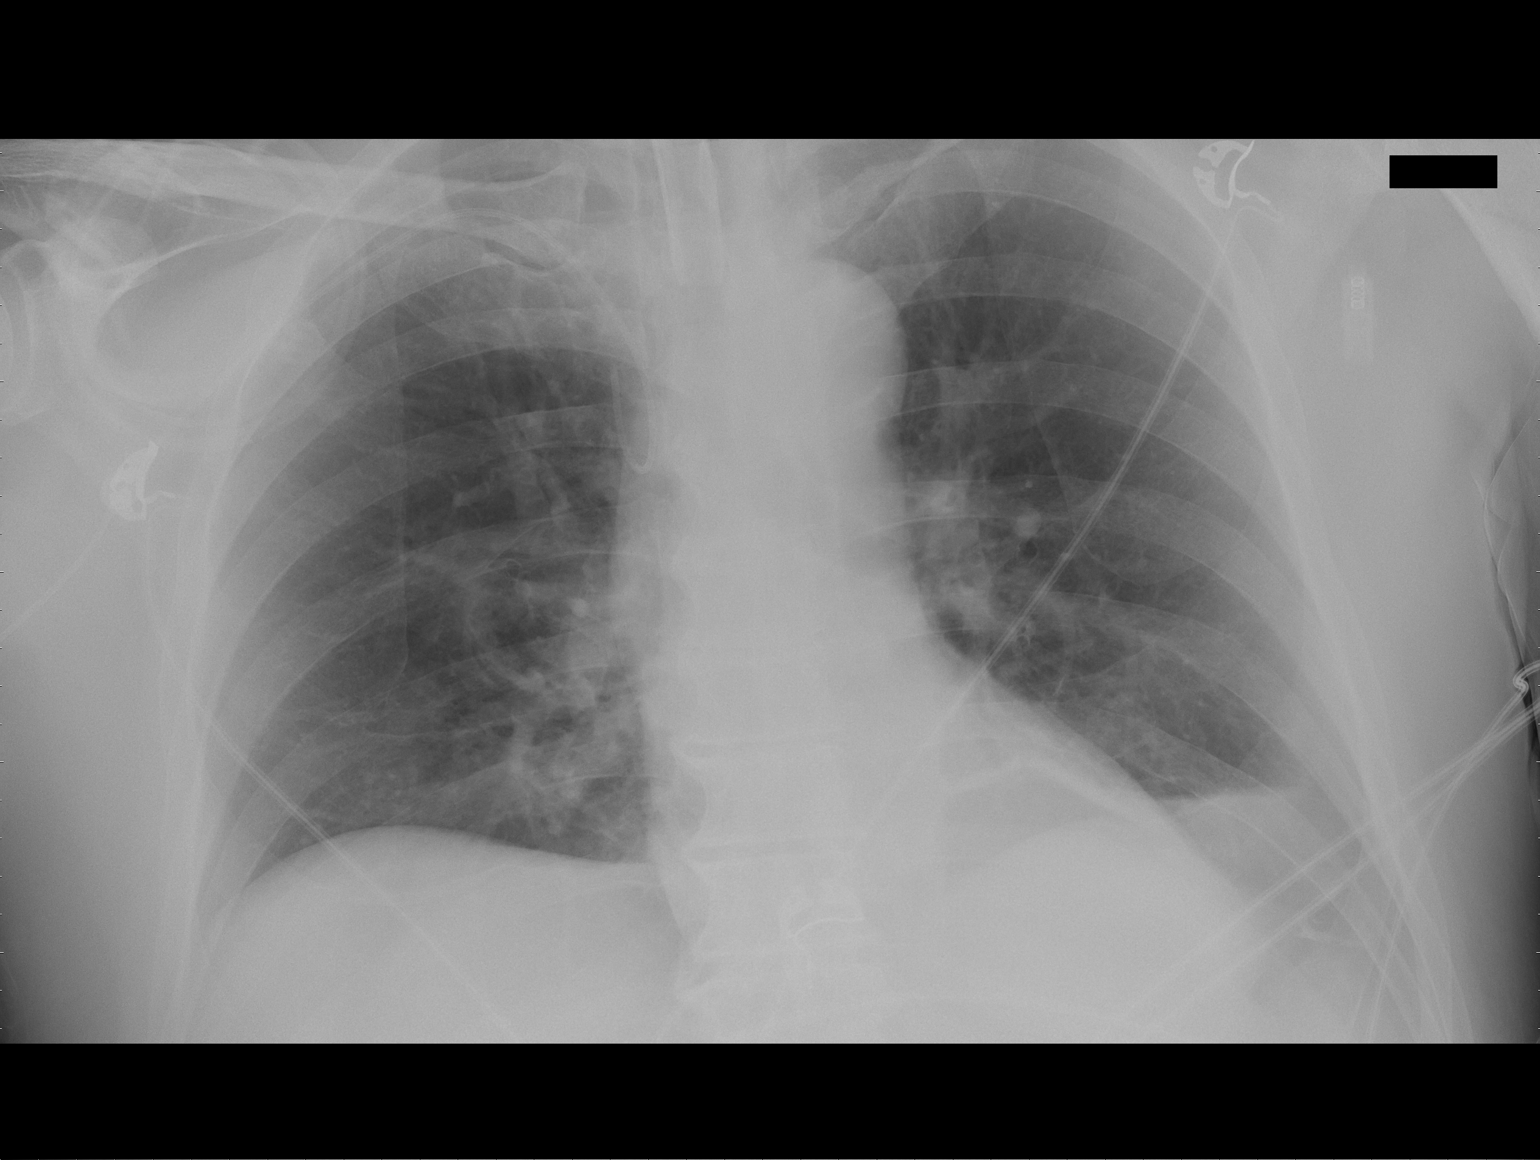

[1 of 1 positions shown; findings below may reference images not displayed]

FINDINGS: Cardiomediastinal silhouette is stable.  There is a
tracheostomy tube with tip 5.3 cm above the carina.  Right arm PICC
line is noted with tip coiled in the SVC.  There is no diagnostic
pneumothorax.

No acute infiltrate or edema.
IMPRESSION: Tracheostomy tube in place.  No diagnostic pneumothorax.  Right arm
PICC line with the tip coiled in the SVC.

## 2015-12-26 DIAGNOSIS — L905 Scar conditions and fibrosis of skin: Secondary | ICD-10-CM | POA: Diagnosis not present

## 2015-12-26 DIAGNOSIS — Z85820 Personal history of malignant melanoma of skin: Secondary | ICD-10-CM | POA: Diagnosis not present

## 2015-12-26 DIAGNOSIS — D2272 Melanocytic nevi of left lower limb, including hip: Secondary | ICD-10-CM | POA: Diagnosis not present

## 2015-12-26 DIAGNOSIS — D0462 Carcinoma in situ of skin of left upper limb, including shoulder: Secondary | ICD-10-CM | POA: Diagnosis not present

## 2015-12-26 DIAGNOSIS — D485 Neoplasm of uncertain behavior of skin: Secondary | ICD-10-CM | POA: Diagnosis not present

## 2015-12-26 DIAGNOSIS — D225 Melanocytic nevi of trunk: Secondary | ICD-10-CM | POA: Diagnosis not present

## 2015-12-26 DIAGNOSIS — D2271 Melanocytic nevi of right lower limb, including hip: Secondary | ICD-10-CM | POA: Diagnosis not present

## 2016-02-19 DIAGNOSIS — D0462 Carcinoma in situ of skin of left upper limb, including shoulder: Secondary | ICD-10-CM | POA: Diagnosis not present

## 2016-02-19 DIAGNOSIS — L570 Actinic keratosis: Secondary | ICD-10-CM | POA: Diagnosis not present

## 2016-04-01 DIAGNOSIS — N182 Chronic kidney disease, stage 2 (mild): Secondary | ICD-10-CM | POA: Diagnosis not present

## 2016-04-01 DIAGNOSIS — E1122 Type 2 diabetes mellitus with diabetic chronic kidney disease: Secondary | ICD-10-CM | POA: Diagnosis not present

## 2016-04-01 DIAGNOSIS — G40909 Epilepsy, unspecified, not intractable, without status epilepticus: Secondary | ICD-10-CM | POA: Diagnosis not present

## 2016-04-01 DIAGNOSIS — I10 Essential (primary) hypertension: Secondary | ICD-10-CM | POA: Diagnosis not present

## 2016-04-08 DIAGNOSIS — N182 Chronic kidney disease, stage 2 (mild): Secondary | ICD-10-CM | POA: Diagnosis not present

## 2016-04-08 DIAGNOSIS — E1122 Type 2 diabetes mellitus with diabetic chronic kidney disease: Secondary | ICD-10-CM | POA: Diagnosis not present

## 2016-04-08 DIAGNOSIS — I10 Essential (primary) hypertension: Secondary | ICD-10-CM | POA: Diagnosis not present

## 2016-04-08 DIAGNOSIS — I69319 Unspecified symptoms and signs involving cognitive functions following cerebral infarction: Secondary | ICD-10-CM | POA: Diagnosis not present

## 2016-04-08 DIAGNOSIS — G40909 Epilepsy, unspecified, not intractable, without status epilepticus: Secondary | ICD-10-CM | POA: Diagnosis not present

## 2016-04-28 ENCOUNTER — Encounter: Payer: PPO | Attending: Internal Medicine | Admitting: *Deleted

## 2016-04-28 ENCOUNTER — Encounter: Payer: Self-pay | Admitting: *Deleted

## 2016-04-28 VITALS — BP 112/70 | Ht 70.0 in | Wt 189.1 lb

## 2016-04-28 DIAGNOSIS — E119 Type 2 diabetes mellitus without complications: Secondary | ICD-10-CM

## 2016-04-28 NOTE — Patient Instructions (Addendum)
Check blood sugars 2 x day before breakfast and 2 hrs after supper every other day Exercise:  Walk   for    20  minutes   6  days a week      [facility] Eat 3 meals day,  1  snack a day Space meals 4-6 hours apart Bring blood sugar records to the next class Call your doctor for a prescription for:  1. Meter strips (type) One Touch Verio checking  3-4 times per week  2. Lancets (type) One Touch Delica checking  3-4   times per week

## 2016-04-29 ENCOUNTER — Encounter: Payer: Self-pay | Admitting: *Deleted

## 2016-04-29 NOTE — Progress Notes (Signed)
Diabetes Self-Management Education  Visit Type: First/Initial  Appt. Start Time: 1410 Appt. End Time: 1525  04/28/2016  [name], identified by name and date of birth, is a 75 y.o. male with a diagnosis of Diabetes: Type 2.   ASSESSMENT  Blood pressure 112/70, height 5\' 10"  (1.778 m), weight 189 lb 1.6 oz (85.775 kg). Body mass index is 27.13 kg/(m^2).      Diabetes Self-Management Education - 04/28/16 1753    Visit Information   Visit Type First/Initial   Initial Visit   Diabetes Type Type 2   Are you currently following a meal plan? Yes   What type of meal plan do you follow? less sugar - cookies   Are you taking your medications as prescribed? Yes   Date Diagnosed May 2017   Health Coping   How would you rate your overall health? Good   Psychosocial Assessment   Patient Belief/Attitude about Diabetes Motivated to manage diabetes   Self-care barriers Other (comment)  memory loss due to past stroke   Self-management support Family;Doctor's office   Other persons present Spouse/SO   Patient Concerns Nutrition/Meal planning;Problem Solving;Glycemic Control   Special Needs Instruct caregiver   Preferred Learning Style Auditory   Learning Readiness Change in progress   How often do you need to have someone help you when you read instructions, pamphlets, or other written materials from your doctor or pharmacy? 2 - Rarely   What is the last grade level you completed in school? college with BS in chemistry   Pre-Education Assessment   Patient understands the diabetes disease and treatment process. Needs Instruction   Patient understands incorporating nutritional management into lifestyle. Needs Instruction   Patient undertands incorporating physical activity into lifestyle. Needs Instruction   Patient understands using medications safely. Needs Instruction   Patient understands monitoring blood glucose, interpreting and using results Needs Instruction   Patient  understands prevention, detection, and treatment of acute complications. Needs Instruction   Patient understands prevention, detection, and treatment of chronic complications. Needs Instruction   Patient understands how to develop strategies to address psychosocial issues. Needs Instruction   Patient understands how to develop strategies to promote health/change behavior. Needs Instruction   Complications   Last HgB A1C per patient/outside source 10.2 %  04/01/16   How often do you check your blood sugar? 0 times/day (not testing)  Provided One Touch Verio Flex meter and instructed on use. BG upon return demonstration was 154 mg/dL at 3:05 pm - 3 hrs pp.    Have you had a dilated eye exam in the past 12 months? Yes   Have you had a dental exam in the past 12 months? Yes   Are you checking your feet? No   Dietary Intake   Breakfast eggs, sausage and 1 piece of toast; cereal, milk and fruit   Lunch grilled cheese sandwich; burger   Dinner roast beef or grilled chicken or hamburger or lasagna with potatoes, green beans, tomatoes, broccolii, fruit, corn bread, mac-n-cheese   Beverage(s) water, coffee   Exercise   Exercise Type Light (walking / raking leaves);Moderate (swimming / aerobic walking)  walking 20 minutes 6 x week in mall; [facility]   How many days per week to you exercise? 6   How many minutes per day do you exercise? 35   Total minutes per week of exercise 210   Patient Education   Previous Diabetes Education No  Disease state  Definition of diabetes, type 1 and 2, and the diagnosis of diabetes   Nutrition management  Role of diet in the treatment of diabetes and the relationship between the three main macronutrients and blood glucose level   Physical activity and exercise  Role of exercise on diabetes management, blood pressure control and cardiac health.   Medications Reviewed patients medication for diabetes, action, purpose, timing of dose and  side effects.   Monitoring Taught/evaluated SMBG meter.;Purpose and frequency of SMBG.;Identified appropriate SMBG and/or A1C goals.   Chronic complications Relationship between chronic complications and blood glucose control   Psychosocial adjustment Identified and addressed patients feelings and concerns about diabetes   Individualized Goals (developed by patient)   Reducing Risk Improve blood sugars Prevent diabetes complications   Outcomes   Expected Outcomes Demonstrated interest in learning. Expect positive outcomes   Future DMSE 4-6 wks      Individualized Plan for Diabetes Self-Management Training:   Learning Objective:  Patient will have a greater understanding of diabetes self-management. Patient education plan is to attend individual and/or group sessions per assessed needs and concerns.   Plan:   Patient Instructions  Check blood sugars 2 x day before breakfast and 2 hrs after supper every other day Exercise:  Walk   for    20  minutes   6  days a week      [facility]  2 days a week Eat 3 meals day,  1  snack a day Space meals 4-6 hours apart Bring blood sugar records to the next class Call your doctor for a prescription for:  1. Meter strips (type) One Touch Verio checking  3-4 times per week  2. Lancets (type) One Touch Delica checking  3-4   times per week   Expected Outcomes:  Demonstrated interest in learning. Expect positive outcomes  Education material provided:  General Meal Planning Guidelines Simple Meal Plan Meter - One Touch Verio Flex  If problems or questions, patient to contact team via:  Johny Drilling, [place], [place], CDE [phone]  Future DSME appointment: 4-6 wks  May 22, 2016 for  Diabetes Class 1

## 2016-06-09 ENCOUNTER — Encounter: Payer: PPO | Attending: Internal Medicine | Admitting: Dietician

## 2016-06-09 ENCOUNTER — Encounter: Payer: Self-pay | Admitting: Dietician

## 2016-06-09 VITALS — Ht 70.0 in | Wt 185.1 lb

## 2016-06-09 DIAGNOSIS — E119 Type 2 diabetes mellitus without complications: Secondary | ICD-10-CM

## 2016-06-09 NOTE — Progress Notes (Signed)

## 2016-06-16 ENCOUNTER — Encounter: Payer: PPO | Attending: Internal Medicine | Admitting: Dietician

## 2016-06-16 ENCOUNTER — Encounter: Payer: Self-pay | Admitting: Dietician

## 2016-06-16 VITALS — Ht 70.0 in | Wt 184.9 lb

## 2016-06-16 DIAGNOSIS — E119 Type 2 diabetes mellitus without complications: Secondary | ICD-10-CM

## 2016-06-16 NOTE — Progress Notes (Signed)

## 2016-06-23 ENCOUNTER — Encounter: Payer: PPO | Admitting: Dietician

## 2016-06-23 ENCOUNTER — Encounter: Payer: Self-pay | Admitting: Dietician

## 2016-06-23 VITALS — BP 130/82 | Ht 70.0 in | Wt 184.7 lb

## 2016-06-23 DIAGNOSIS — E119 Type 2 diabetes mellitus without complications: Secondary | ICD-10-CM | POA: Diagnosis not present

## 2016-06-23 NOTE — Progress Notes (Signed)

## 2016-06-25 DIAGNOSIS — D2272 Melanocytic nevi of left lower limb, including hip: Secondary | ICD-10-CM | POA: Diagnosis not present

## 2016-06-25 DIAGNOSIS — Z85820 Personal history of malignant melanoma of skin: Secondary | ICD-10-CM | POA: Diagnosis not present

## 2016-06-25 DIAGNOSIS — X32XXXA Exposure to sunlight, initial encounter: Secondary | ICD-10-CM | POA: Diagnosis not present

## 2016-06-25 DIAGNOSIS — L570 Actinic keratosis: Secondary | ICD-10-CM | POA: Diagnosis not present

## 2016-06-25 DIAGNOSIS — Z85828 Personal history of other malignant neoplasm of skin: Secondary | ICD-10-CM | POA: Diagnosis not present

## 2016-06-25 DIAGNOSIS — D2261 Melanocytic nevi of right upper limb, including shoulder: Secondary | ICD-10-CM | POA: Diagnosis not present

## 2016-07-01 ENCOUNTER — Encounter: Payer: Self-pay | Admitting: *Deleted

## 2016-07-02 DIAGNOSIS — N182 Chronic kidney disease, stage 2 (mild): Secondary | ICD-10-CM | POA: Diagnosis not present

## 2016-07-02 DIAGNOSIS — E1122 Type 2 diabetes mellitus with diabetic chronic kidney disease: Secondary | ICD-10-CM | POA: Diagnosis not present

## 2016-07-09 DIAGNOSIS — E1122 Type 2 diabetes mellitus with diabetic chronic kidney disease: Secondary | ICD-10-CM | POA: Diagnosis not present

## 2016-07-09 DIAGNOSIS — G40909 Epilepsy, unspecified, not intractable, without status epilepticus: Secondary | ICD-10-CM | POA: Diagnosis not present

## 2016-07-09 DIAGNOSIS — N182 Chronic kidney disease, stage 2 (mild): Secondary | ICD-10-CM | POA: Diagnosis not present

## 2016-07-09 DIAGNOSIS — I10 Essential (primary) hypertension: Secondary | ICD-10-CM | POA: Diagnosis not present

## 2016-07-09 DIAGNOSIS — R7989 Other specified abnormal findings of blood chemistry: Secondary | ICD-10-CM | POA: Diagnosis not present

## 2016-08-13 DIAGNOSIS — M545 Low back pain: Secondary | ICD-10-CM | POA: Diagnosis not present

## 2016-08-13 DIAGNOSIS — N182 Chronic kidney disease, stage 2 (mild): Secondary | ICD-10-CM | POA: Diagnosis not present

## 2016-08-13 DIAGNOSIS — I10 Essential (primary) hypertension: Secondary | ICD-10-CM | POA: Diagnosis not present

## 2016-08-13 DIAGNOSIS — E1122 Type 2 diabetes mellitus with diabetic chronic kidney disease: Secondary | ICD-10-CM | POA: Diagnosis not present

## 2016-10-08 DIAGNOSIS — E113393 Type 2 diabetes mellitus with moderate nonproliferative diabetic retinopathy without macular edema, bilateral: Secondary | ICD-10-CM | POA: Diagnosis not present

## 2016-10-16 DIAGNOSIS — I10 Essential (primary) hypertension: Secondary | ICD-10-CM | POA: Diagnosis not present

## 2016-10-16 DIAGNOSIS — N182 Chronic kidney disease, stage 2 (mild): Secondary | ICD-10-CM | POA: Diagnosis not present

## 2016-10-16 DIAGNOSIS — E1122 Type 2 diabetes mellitus with diabetic chronic kidney disease: Secondary | ICD-10-CM | POA: Diagnosis not present

## 2016-10-16 DIAGNOSIS — R7989 Other specified abnormal findings of blood chemistry: Secondary | ICD-10-CM | POA: Diagnosis not present

## 2016-10-31 DIAGNOSIS — I10 Essential (primary) hypertension: Secondary | ICD-10-CM | POA: Diagnosis not present

## 2016-10-31 DIAGNOSIS — N182 Chronic kidney disease, stage 2 (mild): Secondary | ICD-10-CM | POA: Diagnosis not present

## 2016-10-31 DIAGNOSIS — Z0000 Encounter for general adult medical examination without abnormal findings: Secondary | ICD-10-CM | POA: Diagnosis not present

## 2016-10-31 DIAGNOSIS — E1122 Type 2 diabetes mellitus with diabetic chronic kidney disease: Secondary | ICD-10-CM | POA: Diagnosis not present

## 2016-10-31 DIAGNOSIS — G40909 Epilepsy, unspecified, not intractable, without status epilepticus: Secondary | ICD-10-CM | POA: Diagnosis not present

## 2016-12-04 ENCOUNTER — Emergency Department: Payer: PPO

## 2016-12-04 ENCOUNTER — Inpatient Hospital Stay
Admission: EM | Admit: 2016-12-04 | Discharge: 2016-12-06 | DRG: 247 | Disposition: A | Discharge status: Home / Self Care | Payer: PPO | Attending: Internal Medicine | Admitting: Internal Medicine

## 2016-12-04 ENCOUNTER — Encounter: Payer: Self-pay | Admitting: Emergency Medicine

## 2016-12-04 DIAGNOSIS — R079 Chest pain, unspecified: Secondary | ICD-10-CM | POA: Diagnosis not present

## 2016-12-04 DIAGNOSIS — I222 Subsequent non-ST elevation (NSTEMI) myocardial infarction: Secondary | ICD-10-CM | POA: Diagnosis not present

## 2016-12-04 DIAGNOSIS — Z79899 Other long term (current) drug therapy: Secondary | ICD-10-CM | POA: Diagnosis not present

## 2016-12-04 DIAGNOSIS — G40909 Epilepsy, unspecified, not intractable, without status epilepticus: Secondary | ICD-10-CM | POA: Diagnosis not present

## 2016-12-04 DIAGNOSIS — R413 Other amnesia: Secondary | ICD-10-CM | POA: Diagnosis not present

## 2016-12-04 DIAGNOSIS — I214 Non-ST elevation (NSTEMI) myocardial infarction: Secondary | ICD-10-CM | POA: Diagnosis not present

## 2016-12-04 DIAGNOSIS — I10 Essential (primary) hypertension: Secondary | ICD-10-CM | POA: Diagnosis not present

## 2016-12-04 DIAGNOSIS — R569 Unspecified convulsions: Secondary | ICD-10-CM | POA: Diagnosis not present

## 2016-12-04 DIAGNOSIS — I2510 Atherosclerotic heart disease of native coronary artery without angina pectoris: Secondary | ICD-10-CM | POA: Diagnosis not present

## 2016-12-04 DIAGNOSIS — I69398 Other sequelae of cerebral infarction: Secondary | ICD-10-CM | POA: Diagnosis not present

## 2016-12-04 DIAGNOSIS — E119 Type 2 diabetes mellitus without complications: Secondary | ICD-10-CM | POA: Diagnosis not present

## 2016-12-04 LAB — GLUCOSE, CAPILLARY
GLUCOSE-CAPILLARY: 119 mg/dL — AB (ref 65–99)
Glucose-Capillary: 172 mg/dL — ABNORMAL HIGH (ref 65–99)

## 2016-12-04 LAB — BASIC METABOLIC PANEL
Anion gap: 6 (ref 5–15)
BUN: 16 mg/dL (ref 6–20)
CALCIUM: 9.8 mg/dL (ref 8.9–10.3)
CHLORIDE: 102 mmol/L (ref 101–111)
CO2: 31 mmol/L (ref 22–32)
CREATININE: 0.96 mg/dL (ref 0.61–1.24)
GFR calc Af Amer: >60 mL/min (ref >60)
GFR calc non Af Amer: >60 mL/min (ref >60)
GLUCOSE: 108 mg/dL — AB (ref 65–99)
Potassium: 4.4 mmol/L (ref 3.5–5.1)
Sodium: 139 mmol/L (ref 135–145)

## 2016-12-04 LAB — TROPONIN I
TROPONIN I: 2.51 ng/mL — AB (ref <0.03)
TROPONIN I: 3.81 ng/mL — AB (ref <0.03)
TROPONIN I: 3.54 ng/mL — AB (ref <0.03)

## 2016-12-04 LAB — CBC
HCT: 47.2 % (ref 40.0–52.0)
Hemoglobin: 17 g/dL (ref 13.0–18.0)
MCH: 33.1 pg (ref 26.0–34.0)
MCHC: 35.9 g/dL (ref 32.0–36.0)
MCV: 92 fL (ref 80.0–100.0)
PLATELETS: 183 10*3/uL (ref 150–440)
RBC: 5.13 MIL/uL (ref 4.40–5.90)
RDW: 13.3 % (ref 11.5–14.5)
WBC: 7.5 10*3/uL (ref 3.8–10.6)

## 2016-12-04 LAB — MRSA PCR SCREENING: MRSA by PCR: NEGATIVE

## 2016-12-04 LAB — PROTIME-INR
INR: 1.07
Prothrombin Time: 13.9 seconds (ref 11.4–15.2)

## 2016-12-04 LAB — APTT: aPTT: 32 seconds (ref 24–36)

## 2016-12-04 LAB — HEPARIN LEVEL (UNFRACTIONATED): Heparin Unfractionated: 0.25 IU/mL — ABNORMAL LOW (ref 0.30–0.70)

## 2016-12-04 MED ORDER — LAMOTRIGINE 25 MG PO TABS
75.0000 mg | ORAL_TABLET | Freq: Every day | ORAL | Status: DC
  Administered 2016-12-06: 75 mg via ORAL
  Filled 2016-12-04 (×2): qty 3

## 2016-12-04 MED ORDER — IOPAMIDOL (ISOVUE-370) INJECTION 76%
100.0000 mL | Freq: Once | INTRAVENOUS | Status: AC | PRN
  Administered 2016-12-04: 100 mL via INTRAVENOUS

## 2016-12-04 MED ORDER — INSULIN ASPART 100 UNIT/ML ~~LOC~~ SOLN
0.0000 [IU] | Freq: Three times a day (TID) | SUBCUTANEOUS | Status: DC
  Administered 2016-12-04: 2 [IU] via SUBCUTANEOUS
  Administered 2016-12-05 – 2016-12-06 (×2): 1 [IU] via SUBCUTANEOUS
  Filled 2016-12-04: qty 1
  Filled 2016-12-04: qty 2
  Filled 2016-12-04: qty 1

## 2016-12-04 MED ORDER — HEPARIN (PORCINE) IN NACL 100-0.45 UNIT/ML-% IJ SOLN: 12.0000 [IU]/kg/h | Freq: Once | INTRAMUSCULAR | Status: DC

## 2016-12-04 MED ORDER — HEPARIN (PORCINE) IN NACL 100-0.45 UNIT/ML-% IJ SOLN
1100.0000 [IU]/h | INTRAMUSCULAR | Status: DC
  Administered 2016-12-04: 950 [IU]/h via INTRAVENOUS
  Administered 2016-12-05: 1100 [IU]/h via INTRAVENOUS
  Filled 2016-12-04 (×2): qty 250

## 2016-12-04 MED ORDER — ACETAMINOPHEN 650 MG RE SUPP: 650.0000 mg | Freq: Four times a day (QID) | RECTAL | Status: DC | PRN

## 2016-12-04 MED ORDER — ASPIRIN 81 MG PO CHEW
324.0000 mg | CHEWABLE_TABLET | Freq: Once | ORAL | Status: AC
  Administered 2016-12-04: 324 mg via ORAL
  Filled 2016-12-04: qty 4

## 2016-12-04 MED ORDER — VITAMIN C 500 MG PO TABS
500.0000 mg | ORAL_TABLET | Freq: Every day | ORAL | Status: DC
Start: 2016-12-05 — End: 2016-12-06
  Administered 2016-12-06: 500 mg via ORAL
  Filled 2016-12-04: qty 1

## 2016-12-04 MED ORDER — METOPROLOL SUCCINATE ER 50 MG PO TB24
50.0000 mg | ORAL_TABLET | Freq: Every day | ORAL | Status: DC
  Administered 2016-12-06: 50 mg via ORAL
  Filled 2016-12-04: qty 1

## 2016-12-04 MED ORDER — HEPARIN SODIUM (PORCINE) 5000 UNIT/ML IJ SOLN: 4000.0000 [IU] | Freq: Once | INTRAMUSCULAR | Status: DC

## 2016-12-04 MED ORDER — ADULT MULTIVITAMIN W/MINERALS CH
1.0000 | ORAL_TABLET | Freq: Every day | ORAL | Status: DC
  Administered 2016-12-06: 1 via ORAL
  Filled 2016-12-04: qty 1

## 2016-12-04 MED ORDER — ATORVASTATIN CALCIUM 20 MG PO TABS
40.0000 mg | ORAL_TABLET | Freq: Every day | ORAL | Status: DC
  Administered 2016-12-04 – 2016-12-05 (×2): 40 mg via ORAL
  Filled 2016-12-04 (×3): qty 2

## 2016-12-04 MED ORDER — DOCUSATE SODIUM 100 MG PO CAPS: 100.0000 mg | ORAL_CAPSULE | Freq: Every day | ORAL | Status: DC | PRN

## 2016-12-04 MED ORDER — HEPARIN BOLUS VIA INFUSION
1000.0000 [IU] | Freq: Once | INTRAVENOUS | Status: AC
  Administered 2016-12-04: 1000 [IU] via INTRAVENOUS
  Filled 2016-12-04: qty 1000

## 2016-12-04 MED ORDER — LISINOPRIL 20 MG PO TABS
40.0000 mg | ORAL_TABLET | Freq: Every day | ORAL | Status: DC
  Administered 2016-12-06: 40 mg via ORAL
  Filled 2016-12-04: qty 2

## 2016-12-04 MED ORDER — HEPARIN BOLUS VIA INFUSION
4000.0000 [IU] | Freq: Once | INTRAVENOUS | Status: AC
  Administered 2016-12-04: 4000 [IU] via INTRAVENOUS
  Filled 2016-12-04: qty 4000

## 2016-12-04 MED ORDER — ACETAMINOPHEN 325 MG PO TABS: 650.0000 mg | ORAL_TABLET | Freq: Four times a day (QID) | ORAL | Status: DC | PRN

## 2016-12-04 MED ORDER — INSULIN ASPART 100 UNIT/ML ~~LOC~~ SOLN: 0.0000 [IU] | Freq: Every day | SUBCUTANEOUS | Status: DC

## 2016-12-04 MED ORDER — HYDRALAZINE HCL 10 MG PO TABS
10.0000 mg | ORAL_TABLET | Freq: Three times a day (TID) | ORAL | Status: DC
  Administered 2016-12-04 – 2016-12-06 (×4): 10 mg via ORAL
  Filled 2016-12-04 (×8): qty 1

## 2016-12-04 NOTE — ED Notes (Signed)
Care taken over by [facility], [facility] RN notified of critical trop, Brayton Layman RN states she was inform provider of positive trop

## 2016-12-04 NOTE — H&P (Signed)
[place] [facility] [place] NAME: Gregory Humphrey    MR#:  [id number]  DATE OF BIRTH:  [birth year]  DATE OF ADMISSION:  12/04/2016  PRIMARY CARE PHYSICIAN: Kirk Ruths., MD   REQUESTING/REFERRING PHYSICIAN: Dr Cephas Darby  CHIEF COMPLAINT:   Chief Complaint  Patient presents with  . Chest Pain    HISTORY OF PRESENT ILLNESS:  Gregory Humphrey  is a 76 y.o. male with a known history of CVA secondary to subdural hematoma and craniotomy, diabetes, short-term memory loss. He was having severe acid indigestion last night he also feels a sensitivity on his back he had a burning type chest pain 7-8 out of 10 in intensity last night. Currently he has no chest pain. The pain started last night for a few hours when away they took his blood pressure was elevated. 2 AM he woke up again with severe acid reflux and took some Tums. It was associated with some nausea but no shortness of breath and no diaphoresis. Of note he has had shortness of breath with exertion recently. In the ER is found to have a troponin of 2.51. CT scan of the chest was negative for pulmonary embolism or dissection.  PAST MEDICAL HISTORY:   Past Medical History:  Diagnosis Date  . Diabetes mellitus without complication ([place])   . Gilbert's syndrome   . Hypertension   . Kidney stones   . Olecranon bursitis   . Seizures ([place])   . Stroke ([place])   . Subdural hematoma ([facility])     PAST SURGICAL HISTORY:   Past Surgical History:  Procedure Laterality Date  . CRANIOTOMY  10/08/2011   Procedure: CRANIOTOMY HEMATOMA EVACUATION SUBDURAL;  Surgeon: Peggyann Shoals, MD;  Location: [place] [facility];  Service: Neurosurgery;  Laterality: Left;  Left Craniotomy for subdural  . PEG PLACEMENT  10/16/2011   Procedure: PERCUTANEOUS ENDOSCOPIC GASTROSTOMY (PEG) PLACEMENT;  Surgeon: Zenovia Jarred, MD;  Location: [place];  Service: Gastroenterology;  Laterality: N/A;  . REFRACTIVE SURGERY   1995    SOCIAL HISTORY:   Social History  Substance Use Topics  . Smoking status: Never Smoker  . Smokeless tobacco: Never Used  . Alcohol use No    FAMILY HISTORY:   Family History  Problem Relation Age of Onset  . Stroke Mother   . Hypertension Mother   . Diabetes Mother   . Cancer Father     bone  . Alcohol abuse Brother     past  . Diabetes Maternal Uncle   . Stroke Maternal Grandfather     DRUG ALLERGIES:  No Known Allergies  REVIEW OF SYSTEMS:  CONSTITUTIONAL: No fever, fatigue or weakness. Some weight loss from 185-170 over a period of time EYES: No blurred or double vision. Wears glasses EARS, NOSE, AND THROAT: No tinnitus or ear pain. No sore throat. Decreased hearing. Positive for runny nose. RESPIRATORY: No cough. With exertion he has shortness of breath. No wheezing or hemoptysis.  CARDIOVASCULAR: Positive for chest pain. No orthopnea, edema.  GASTROINTESTINAL: Positive for nausea. No vomiting, or abdominal pain. No blood in bowel movements. Positive for GERD and some diarrhea GENITOURINARY: No dysuria, hematuria.  ENDOCRINE: No polyuria, nocturia,  HEMATOLOGY: No anemia, easy bruising or bleeding SKIN: No rash or lesion. MUSCULOSKELETAL: Occasional joint pain NEUROLOGIC: No tingling, numbness, weakness.  PSYCHIATRY: No anxiety or depression.   MEDICATIONS AT HOME:   Prior to Admission medications   Medication Sig Start Date End Date Taking? Authorizing Provider  Docusate Calcium (STOOL SOFTENER PO) Take 2 capsules by mouth daily as needed.   Yes Historical Provider, MD  hydrALAZINE (APRESOLINE) 10 MG tablet Take 10 mg by mouth 3 (three) times daily. 10/23/15  Yes Historical Provider, MD  lamoTRIgine (LAMICTAL) 25 MG tablet Take 75 mg by mouth daily.  10/23/15  Yes Historical Provider, MD  lisinopril (PRINIVIL,ZESTRIL) 40 MG tablet Take 40 mg by mouth daily. 01/21/16  Yes Historical Provider, MD  metFORMIN (GLUCOPHAGE) 500 MG tablet Take 500 mg by mouth  2 (two) times daily. 04/08/16 04/08/17 Yes Historical Provider, MD  metoprolol succinate (TOPROL-XL) 50 MG 24 hr tablet Take 50 mg by mouth daily. 10/23/15  Yes Historical Provider, MD  Multiple Vitamin (MULTIVITAMIN) tablet Take 1 tablet by mouth daily.   Yes Historical Provider, MD  vitamin C (ASCORBIC ACID) 500 MG tablet Take 500 mg by mouth daily.   Yes Historical Provider, MD      VITAL SIGNS:  Blood pressure (!) 146/83, pulse (!) 59, temperature 97.7 F (36.5 C), temperature source Oral, resp. rate 17, height 5\' 10"  (1.778 m), weight 77.1 kg (170 lb), SpO2 97 %.  PHYSICAL EXAMINATION:  GENERAL:  76 y.o.-year-old patient lying in the bed with no acute distress.  EYES: Pupils equal, round, reactive to light and accommodation. No scleral icterus. Extraocular muscles intact.  HEENT: Head atraumatic, normocephalic. Oropharynx and nasopharynx clear.  NECK:  Supple, no jugular venous distention. No thyroid enlargement, no tenderness.  LUNGS: Normal breath sounds bilaterally, no wheezing, rales,rhonchi or crepitation. No use of accessory muscles of respiration.  CARDIOVASCULAR: S1, S2 normal. No murmurs, rubs, or gallops.  ABDOMEN: Soft, nontender, nondistended. Bowel sounds present. No organomegaly or mass.  EXTREMITIES: No pedal edema, cyanosis, or clubbing.  NEUROLOGIC: Cranial nerves II through XII are intact. Muscle strength 5/5 in all extremities. Sensation intact. Gait not checked.  PSYCHIATRIC: The patient is alert and oriented x 3.  SKIN: No rash, lesion, or ulcer.   LABORATORY PANEL:   CBC  Recent Labs Lab 12/04/16 0949  WBC 7.5  HGB 17.0  HCT 47.2  PLT 183   ------------------------------------------------------------------------------------------------------------------  Chemistries   Recent Labs Lab 12/04/16 0949  NA 139  K 4.4  CL 102  CO2 31  GLUCOSE 108*  BUN 16  CREATININE 0.96  CALCIUM 9.8    ------------------------------------------------------------------------------------------------------------------  Cardiac Enzymes  Recent Labs Lab 12/04/16 0949  TROPONINI 2.51*   ------------------------------------------------------------------------------------------------------------------  RADIOLOGY:  Dg Chest 2 View  Result Date: 12/04/2016 CLINICAL DATA:  Chest pain EXAM: CHEST  2 VIEW COMPARISON:  09/30/2012 FINDINGS: Heart and mediastinal contours are within normal limits. No focal opacities or effusions. No acute bony abnormality. IMPRESSION: No active cardiopulmonary disease. Electronically Signed   By: Rolm Baptise M.D.   On: 12/04/2016 10:08   Ct Angio Chest Aorta W And/or Wo Contrast  Result Date: 12/04/2016 CLINICAL DATA:  Mid chest pain radiating to back. EXAM: CT ANGIOGRAPHY CHEST WITH CONTRAST TECHNIQUE: Multidetector CT imaging of the chest was performed using the standard protocol during bolus administration of intravenous contrast. Multiplanar CT image reconstructions and MIPs were obtained to evaluate the vascular anatomy. CONTRAST:  100 cc Isovue 370 IV COMPARISON:  Chest x-ray earlier today. FINDINGS: Cardiovascular: Heart is normal size. Aorta is normal caliber. No dissection. Scattered aortic and coronary artery calcifications. No filling defects in the pulmonary arteries to suggest pulmonary emboli. Mediastinum/Nodes: No mediastinal, hilar, or axillary adenopathy. Lungs/Pleura: Lungs are clear. No focal airspace opacities or suspicious nodules. No effusions.  Upper Abdomen: Multiple small layering gallstones within the gallbladder. Musculoskeletal: Chest wall soft tissues are unremarkable. No acute bony abnormality or focal bone lesion. Review of the MIP images confirms the above findings. IMPRESSION: No evidence of aortic aneurysm, dissection or pulmonary embolus. No acute cardiopulmonary disease. Cholelithiasis. Electronically Signed   By: Rolm Baptise M.D.   On:  12/04/2016 11:45    EKG:   Sinus bradycardia of 59 bpm no acute ST-T wave changes  IMPRESSION AND PLAN:   1. NSTEMI. Patient started on heparin drip by ER physician and given an aspirin. Patient already on metoprolol. And Lipitor and check a lipid profile tomorrow. Dr. Ubaldo Glassing to do a cardiac catheterization tomorrow morning. Patient is currently chest pain-free. 2.  Essential hypertension continue usual medications 3. Type 2 diabetes mellitus hold Glucophage with CT scan today and cardiac catheter tomorrow. Put on sliding scale only. 4. History of subdural hematoma with stroke and craniotomy 5 years ago. Patient has short-term memory loss. Watch closely on heparin drip 5. History of seizures on Lamictal  All the records are reviewed and case discussed with ED provider. Management plans discussed with the patient, family and they are in agreement.  CODE STATUS: Full code  TOTAL TIME TAKING CARE OF THIS PATIENT: 50 minutes.    Loletha Grayer M.D on 12/04/2016 at 2:15 PM  Between 7am to 6pm - Pager - [phone]  After 6pm call admission pager [phone]  [facility] [facility]  [phone]  CC: Primary care physician; Kirk Ruths., MD

## 2016-12-04 NOTE — ED Provider Notes (Signed)
[facility] Emergency Department Provider Note        Time seen: ----------------------------------------- 10:47 AM on 12/04/2016 -----------------------------------------    I have reviewed the triage vital signs and the nursing notes.   HISTORY  Chief Complaint Chest Pain    HPI Gregory Humphrey is a 76 y.o. male who presents to ER for chest pressure since last night. Patient had described the symptoms as indigestion. Now he had complained of some pain between his shoulder blades. He denies any of these symptoms at this time, denies sweats, nausea, shortness of breath. He denies a history of this before. Nothing makes it better or worse.   Past Medical History:  Diagnosis Date  . Diabetes mellitus without complication ([place])   . Gilbert's syndrome   . Hypertension   . Kidney stones   . Olecranon bursitis   . Seizures ([place])   . Stroke ([place])   . Subdural hematoma [facility])     Patient Active Problem List   Diagnosis Date Noted  . Left hemiparesis ([place]) 11/11/2011  . Tracheostomy in place [facility]) 11/11/2011  . PNA (pneumonia) 11/04/2011  . Chronic respiratory failure ([place]) 10/23/2011  . Hyperglycemia 10/23/2011  . Hypernatremia 10/23/2011  . Purulent bronchitis ([place]) 10/23/2011  . Physical deconditioning 10/23/2011  . ICH (intracerebral hemorrhage) ([place]) 10/08/2011  . CARBOHYDRATE METABOLISM DISORDER 05/30/2008  . HYPERCHOLESTEROLEMIA 05/30/2008  . GILBERT'S SYNDROME 05/30/2008  . Essential hypertension, benign 05/30/2008  . RENAL CALCULUS, HX OF 05/30/2008    Past Surgical History:  Procedure Laterality Date  . CRANIOTOMY  10/08/2011   Procedure: CRANIOTOMY HEMATOMA EVACUATION SUBDURAL;  Surgeon: Peggyann Shoals, MD;  Location: [place] [facility];  Service: Neurosurgery;  Laterality: Left;  Left Craniotomy for subdural  . PEG PLACEMENT  10/16/2011   Procedure: PERCUTANEOUS ENDOSCOPIC GASTROSTOMY (PEG) PLACEMENT;  Surgeon: Zenovia Jarred, MD;  Location:  [place];  Service: Gastroenterology;  Laterality: N/A;  . [place]    Allergies Patient has no known allergies.  Social History Social History  Substance Use Topics  . Smoking status: Never Smoker  . Smokeless tobacco: Never Used  . Alcohol use No    Review of Systems Constitutional: Negative for fever. Cardiovascular: Positive for recent chest pain Respiratory: Negative for shortness of breath. Gastrointestinal: Negative for abdominal pain, vomiting and diarrhea. Genitourinary: Negative for dysuria. Musculoskeletal: Negative for back pain. Skin: Negative for rash. Neurological: Negative for headaches, focal weakness or numbness.  10-point ROS otherwise negative.  ____________________________________________   PHYSICAL EXAM:  VITAL SIGNS: ED Triage Vitals  Enc Vitals Group     BP 12/04/16 0947 (!) 144/83     Pulse Rate 12/04/16 0947 (!) 57     Resp 12/04/16 0947 16     Temp 12/04/16 0947 97.7 F (36.5 C)     Temp Source 12/04/16 0947 Oral     SpO2 12/04/16 0947 98 %     Weight 12/04/16 0948 170 lb (77.1 kg)     Height 12/04/16 0948 5\' 10"  (1.778 m)     Head Circumference --      Peak Flow --      Pain Score 12/04/16 0949 3     Pain Loc --      Pain Edu? --      Excl. in [place]? --     Constitutional: Alert and oriented. Well appearing and in no distress. Eyes: Conjunctivae are normal. PERRL. Normal extraocular movements. ENT   Head: Normocephalic and atraumatic.  Nose: No congestion/rhinnorhea.   Mouth/Throat: Mucous membranes are moist.   Neck: No stridor. Cardiovascular: Normal rate, regular rhythm. No murmurs, rubs, or gallops. Respiratory: Normal respiratory effort without tachypnea nor retractions. Breath sounds are clear and equal bilaterally. No wheezes/rales/rhonchi. Gastrointestinal: Soft and nontender. Normal bowel sounds Musculoskeletal: Nontender with normal range of motion in all extremities. No lower extremity  tenderness nor edema. Neurologic:  Normal speech and language. No gross focal neurologic deficits are appreciated.  Skin:  Skin is warm, dry and intact. No rash noted. Psychiatric: Mood and affect are normal. Speech and behavior are normal.  ____________________________________________  EKG: Interpreted by me.Sinus bradycardia with a rate of 59 bpm, normal PR interval, normal QRS, normal QT, normal axis.  ____________________________________________  ED COURSE:  Pertinent labs & imaging results that were available during my care of the patient were reviewed by me and considered in my medical decision making (see chart for details). Patient resents to ER with chest pain. We will assess with basic labs and imaging.   Procedures ____________________________________________   LABS (pertinent positives/negatives)  Labs Reviewed  BASIC METABOLIC PANEL - Abnormal; Notable for the following:       Result Value   Glucose, Bld 108 (*)    All other components within normal limits  TROPONIN I - Abnormal; Notable for the following:    Troponin I 2.51 (*)    All other components within normal limits  CBC  APTT  PROTIME-INR  HEPARIN LEVEL (UNFRACTIONATED)   CRITICAL CARE Performed by: Earleen Newport   Total critical care time: 30 minutes  Critical care time was exclusive of separately billable procedures and treating other patients.  Critical care was necessary to treat or prevent imminent or life-threatening deterioration.  Critical care was time spent personally by me on the following activities: development of treatment plan with patient and/or surrogate as well as nursing, discussions with consultants, evaluation of patient's response to treatment, examination of patient, obtaining history from patient or surrogate, ordering and performing treatments and interventions, ordering and review of laboratory studies, ordering and review of radiographic studies, pulse oximetry and  re-evaluation of patient's condition.   RADIOLOGY Images were viewed by me  Chest x-ray is unremarkable CT angiogram is negative for dissection ____________________________________________  FINAL ASSESSMENT AND PLAN  NSTEMI  Plan: Patient with labs and imaging as dictated above. Patient had presented to the ER for chest pressure which has resolved now. He has been given aspirin as well as placed on a heparin drip. He is stable for admission at this time.   Earleen Newport, MD   Note: This note was generated in part or whole with voice recognition software. Voice recognition is usually quite accurate but there are transcription errors that can and very often do occur. I apologize for any typographical errors that were not detected and corrected.     Earleen Newport, MD 12/04/16 [phone]

## 2016-12-04 NOTE — ED Notes (Signed)
Patient denies pain and is resting comfortably.  

## 2016-12-04 NOTE — ED Triage Notes (Signed)
Patient presents to ED via POV with c/i indigestion since last night. Patient is now c/o pain in between shoulder blades. Denies cardiac history. Patient ambulatory to room. NAD noted. Patient A&O x4.

## 2016-12-04 NOTE — Consult Note (Signed)
[facility]  CARDIOLOGY CONSULT NOTE  Patient ID: Gregory Humphrey MRN: [id number] DOB/AGE: 75-Feb-1942 75 y.o.  Admit date: 12/04/2016 Referring Physician Dr. Jimmye Norman Primary Physician Dr. Ouida Sills Primary Cardiologist   Reason for Consultation nstemi  HPI: Pt is a 76 yo male with no prior cardiac history who presented ot the er after 24-48 hours of midsternal chest pain. The pain waxed and waned somewhat but was constant for most of today prior to presenting. In the er his pain resolved. EKG was normal. Serum troponin was 2.5. Chest ct with contrast was negative for discection. He has history of subdural hematoma 5 years ago secondary to avm. He denies tobacco abuse. He has dm.   Review of Systems  Constitutional: Negative.   HENT: Negative.   Eyes: Negative.   Respiratory: Negative.   Cardiovascular: Positive for chest pain.  Gastrointestinal: Negative.   Genitourinary: Negative.   Musculoskeletal: Negative.   Skin: Negative.   Neurological: Negative.   Endo/Heme/Allergies: Negative.   Psychiatric/Behavioral: Negative.     Past Medical History:  Diagnosis Date  . Diabetes mellitus without complication ([place])   . Gilbert's syndrome   . Hypertension   . Kidney stones   . Olecranon bursitis   . Seizures ([place])   . Stroke ([place])   . Subdural hematoma ([facility])     Family History  Problem Relation Age of Onset  . Stroke Mother   . Hypertension Mother   . Diabetes Mother   . Cancer Father     bone  . Alcohol abuse Brother     past  . Diabetes Maternal Uncle   . Stroke Maternal Grandfather     Social History   Social History  . Marital status: Married    Spouse name: N/A  . Number of children: N/A  . Years of education: N/A   Occupational History  . 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 Smoking status: Never Smoker  . Smokeless tobacco: Never Used  . Alcohol use No  . Drug  use: No  . Sexual activity: No   Other Topics Concern  . Not on file   Social History Narrative   Married, lives with wife, adopted son    Past Surgical History:  Procedure Laterality Date  . CRANIOTOMY  10/08/2011   Procedure: CRANIOTOMY HEMATOMA EVACUATION SUBDURAL;  Surgeon: Peggyann Shoals, MD;  Location: [facility];  Service: Neurosurgery;  Laterality: Left;  Left Craniotomy for subdural  . PEG PLACEMENT  10/16/2011   Procedure: PERCUTANEOUS ENDOSCOPIC GASTROSTOMY (PEG) PLACEMENT;  Surgeon: Zenovia Jarred, MD;  Location: [facility];  Service: Gastroenterology;  Laterality: N/A;  . [place]      (Not in a hospital admission)  Physical Exam: Blood pressure (!) 146/83, pulse (!) 59, temperature 97.7 F (36.5 C), temperature source Oral, resp. rate 17, height 5\' 10"  (1.778 m), weight 170 lb (77.1 kg), SpO2 97 %.   Wt Readings from Last 1 Encounters:  12/04/16 170 lb (77.1 kg)     General appearance: alert and cooperative Resp: clear to auscultation bilaterally Chest wall: no tenderness Cardio: regular rate and rhythm GI: soft, non-tender; bowel sounds normal; no masses,  no organomegaly Extremities: extremities normal, atraumatic, no cyanosis or edema Pulses: 2+ and symmetric Neurologic: Grossly normal  Labs:   Lab Results  Component Value Date   WBC 7.5 12/04/2016   HGB 17.0 12/04/2016  HCT 47.2 12/04/2016   MCV 92.0 12/04/2016   PLT 183 12/04/2016    Recent Labs Lab 12/04/16 0949  NA 139  K 4.4  CL 102  CO2 31  BUN 16  CREATININE 0.96  CALCIUM 9.8  GLUCOSE 108*   Lab Results  Component Value Date   CKTOTAL 78 10/07/2011   CKMB 2.1 10/07/2011   TROPONINI 2.51 ([place]) 12/04/2016      Radiology: no acute cardiopulmonary disease EKG: nsr with no ischemia  ASSESSMENT AND PLAN:  Pt with no prior cardiac history with several days of chest pain. Has ruled in for nstemi. EKG normal at present and pain free. Chest ct with contrast negative  for aortic disease. Will treat with iv heparin, asa, betablocker, statin and follow troponin. Echo to evaluate lv funciton. Cardiac cath in am.  Signed: Teodoro Spray MD, [facility] 12/04/2016, 2:08 PM

## 2016-12-04 NOTE — Progress Notes (Signed)
Patient admitted to unit. Oriented to room, call bell, and staff. Bed in lowest position. Fall safety plan reviewed. Full assessment to Epic. Skin assessment verified with Serenity RN. Telemetry box verification with tele clerk and Shana NT- Box#: [id number]. Will continue to monitor.

## 2016-12-04 NOTE — Progress Notes (Addendum)
ANTICOAGULATION CONSULT NOTE - follow [facility] for Heparin  Indication: chest pain/ACS  No Known Allergies  Patient Measurements: Height: 5\' 10"  (177.8 cm) Weight: 170 lb (77.1 kg) IBW/kg (Calculated) : 73 Heparin Dosing Weight: 77.1 kg  Vital Signs: Temp: 98.7 F (37.1 C) (01/25 1936) Temp Source: Oral (01/25 1936) BP: 130/77 (01/25 1936) Pulse Rate: 60 (01/25 1936)  Labs:  Recent Labs  12/04/16 0949 12/04/16 1058 12/04/16 1617 12/04/16 2006  HGB 17.0  --   --   --   HCT 47.2  --   --   --   PLT 183  --   --   --   APTT  --  32  --   --   LABPROT  --   --  13.9  --   INR  --   --  1.07  --   HEPARINUNFRC  --   --   --  0.25*  CREATININE 0.96  --   --   --   TROPONINI 2.51*  --  3.81*  --     Estimated Creatinine Clearance: 69.7 mL/min (by C-G formula based on SCr of 0.96 mg/dL).  Assessment: 76 yo male here with elevated troponin, possible NSTEMI. No oral anticoagulants on PTA med list; per RN, pt not on any blood thinners when asked. Per RN, tube for aPTT drawn and sent to lab. Per pt's problem list, lists subdural hematoma. Spoke to Dr Jimmye Norman in the ED who confirmed subdural hematoma is remote and that benefit of heparin drip outweighs risks with acute MI.   Hgb 17.0, Plt 183 APTT and INR pending  Goal of Therapy:  Heparin level 0.3-0.7 units/ml Monitor platelets by anticoagulation protocol: Yes   Plan:  Will order heparin bolus 4000 units IV x1 then start heparin drip at 950 units/hr (=9.5 ml/hr) Heparin level in 8h CBC in AM  1/25 HL@ 2006= 0.25. Will give Heparin 1000 unit bolus and increase rate to 1100 units/hr. F/u Heparin level in 8 hrs at 0500 1/26.  1/26 AM heparin level 0.41. Continue current regimen. Recheck in 8 hours to confirm.  Pharmacy will continue to follow.   Sim Boast, PharmD, BCPS  12/05/16

## 2016-12-04 NOTE — ED Notes (Signed)
Dr. Fath at bedside  

## 2016-12-04 NOTE — Progress Notes (Signed)
ANTICOAGULATION CONSULT NOTE - Initial Consult  Pharmacy Consult for Heparin  Indication: chest pain/ACS  No Known Allergies  Patient Measurements: Height: 5\' 10"  (177.8 cm) Weight: 170 lb (77.1 kg) IBW/kg (Calculated) : 73 Heparin Dosing Weight: 77.1 kg  Vital Signs: Temp: 97.7 F (36.5 C) (01/25 0947) Temp Source: Oral (01/25 0947) BP: 137/88 (01/25 1105) Pulse Rate: 63 (01/25 1105)  Labs:  Recent Labs  12/04/16 0949  HGB 17.0  HCT 47.2  PLT 183  CREATININE 0.96  TROPONINI 2.51*    Estimated Creatinine Clearance: 69.7 mL/min (by C-G formula based on SCr of 0.96 mg/dL).  Assessment: 76 yo male here with elevated troponin, possible NSTEMI. No oral anticoagulants on PTA med list; per RN, pt not on any blood thinners when asked. Per RN, tube for aPTT drawn and sent to lab. Per pt's problem list, lists subdural hematoma. Spoke to Dr Jimmye Norman in the ED who confirmed subdural hematoma is remote and that benefit of heparin drip outweighs risks with acute MI.   Hgb 17.0, Plt 183 APTT and INR pending  Goal of Therapy:  Heparin level 0.3-0.7 units/ml Monitor platelets by anticoagulation protocol: Yes   Plan:  Will order heparin bolus 4000 units IV x1 then start heparin drip at 950 units/hr (=9.5 ml/hr) Heparin level in 8h CBC in AM  Pharmacy will continue to follow.   Rayna Sexton L 12/04/2016,11:11 AM

## 2016-12-05 ENCOUNTER — Encounter: Payer: Self-pay | Admitting: Cardiology

## 2016-12-05 ENCOUNTER — Encounter
Admission: EM | Disposition: A | Discharge status: Home / Self Care | Payer: Self-pay | Source: Home / Self Care | Attending: Internal Medicine

## 2016-12-05 DIAGNOSIS — I2510 Atherosclerotic heart disease of native coronary artery without angina pectoris: Secondary | ICD-10-CM

## 2016-12-05 HISTORY — PX: CARDIAC CATHETERIZATION: SHX172

## 2016-12-05 LAB — LIPID PANEL
CHOL/HDL RATIO: 3.1 ratio
Cholesterol: 113 mg/dL (ref 0–200)
HDL: 37 mg/dL — ABNORMAL LOW (ref >40)
LDL Cholesterol: 56 mg/dL (ref 0–99)
Triglycerides: 101 mg/dL (ref <150)
VLDL: 20 mg/dL (ref 0–40)

## 2016-12-05 LAB — BASIC METABOLIC PANEL
Anion gap: 8 (ref 5–15)
BUN: 17 mg/dL (ref 6–20)
CHLORIDE: 108 mmol/L (ref 101–111)
CO2: 25 mmol/L (ref 22–32)
CREATININE: 0.88 mg/dL (ref 0.61–1.24)
Calcium: 9.1 mg/dL (ref 8.9–10.3)
GFR calc Af Amer: >60 mL/min (ref >60)
GFR calc non Af Amer: >60 mL/min (ref >60)
GLUCOSE: 116 mg/dL — AB (ref 65–99)
POTASSIUM: 3.8 mmol/L (ref 3.5–5.1)
SODIUM: 141 mmol/L (ref 135–145)

## 2016-12-05 LAB — CBC
HEMATOCRIT: 43.1 % (ref 40.0–52.0)
HEMOGLOBIN: 15.2 g/dL (ref 13.0–18.0)
MCH: 32.6 pg (ref 26.0–34.0)
MCHC: 35.4 g/dL (ref 32.0–36.0)
MCV: 92 fL (ref 80.0–100.0)
Platelets: 143 10*3/uL — ABNORMAL LOW (ref 150–440)
RBC: 4.68 MIL/uL (ref 4.40–5.90)
RDW: 13.5 % (ref 11.5–14.5)
WBC: 6.5 10*3/uL (ref 3.8–10.6)

## 2016-12-05 LAB — GLUCOSE, CAPILLARY
GLUCOSE-CAPILLARY: 107 mg/dL — AB (ref 65–99)
GLUCOSE-CAPILLARY: 128 mg/dL — AB (ref 65–99)
Glucose-Capillary: 108 mg/dL — ABNORMAL HIGH (ref 65–99)
Glucose-Capillary: 121 mg/dL — ABNORMAL HIGH (ref 65–99)

## 2016-12-05 LAB — POCT ACTIVATED CLOTTING TIME: ACTIVATED CLOTTING TIME: 367 s

## 2016-12-05 LAB — HEPARIN LEVEL (UNFRACTIONATED): HEPARIN UNFRACTIONATED: 0.41 [IU]/mL (ref 0.30–0.70)

## 2016-12-05 SURGERY — LEFT HEART CATH AND CORONARY ANGIOGRAPHY
Anesthesia: Moderate Sedation

## 2016-12-05 MED ORDER — ASPIRIN 81 MG PO CHEW
81.0000 mg | CHEWABLE_TABLET | Freq: Every day | ORAL | Status: DC
  Administered 2016-12-05: 81 mg via ORAL
  Filled 2016-12-05: qty 1

## 2016-12-05 MED ORDER — HEPARIN (PORCINE) IN NACL 2-0.9 UNIT/ML-% IJ SOLN
INTRAMUSCULAR | Status: AC
  Filled 2016-12-05: qty 1000

## 2016-12-05 MED ORDER — CLOPIDOGREL BISULFATE 300 MG PO TABS
ORAL_TABLET | ORAL | Status: AC
  Filled 2016-12-05: qty 2

## 2016-12-05 MED ORDER — LABETALOL HCL 5 MG/ML IV SOLN: 10.0000 mg | INTRAVENOUS | Status: AC | PRN

## 2016-12-05 MED ORDER — NITROGLYCERIN 1 MG/10 ML FOR IR/CATH LAB
INTRA_ARTERIAL | Status: DC | PRN
  Administered 2016-12-05 (×2): 200 ug via INTRACORONARY
  Administered 2016-12-05: 400 ug

## 2016-12-05 MED ORDER — FENTANYL CITRATE (PF) 100 MCG/2ML IJ SOLN
INTRAMUSCULAR | Status: AC
  Filled 2016-12-05: qty 2

## 2016-12-05 MED ORDER — SODIUM CHLORIDE 0.9 % WEIGHT BASED INFUSION
1.0000 mL/kg/h | INTRAVENOUS | Status: AC
  Administered 2016-12-05: 1 mL/kg/h via INTRAVENOUS

## 2016-12-05 MED ORDER — SODIUM CHLORIDE 0.9% FLUSH
3.0000 mL | Freq: Two times a day (BID) | INTRAVENOUS | Status: DC
  Administered 2016-12-05 – 2016-12-06 (×2): 3 mL via INTRAVENOUS

## 2016-12-05 MED ORDER — BIVALIRUDIN BOLUS VIA INFUSION - CUPID
INTRAVENOUS | Status: DC | PRN
  Administered 2016-12-05: 58.875 mg via INTRAVENOUS

## 2016-12-05 MED ORDER — SODIUM CHLORIDE 0.9% FLUSH: 3.0000 mL | INTRAVENOUS | Status: DC | PRN

## 2016-12-05 MED ORDER — CLOPIDOGREL BISULFATE 75 MG PO TABS
75.0000 mg | ORAL_TABLET | Freq: Every day | ORAL | Status: DC
  Administered 2016-12-06: 75 mg via ORAL
  Filled 2016-12-05: qty 1

## 2016-12-05 MED ORDER — CLOPIDOGREL BISULFATE 75 MG PO TABS
ORAL_TABLET | ORAL | Status: DC | PRN
  Administered 2016-12-05: 600 mg via ORAL

## 2016-12-05 MED ORDER — SODIUM CHLORIDE 0.9 % IV SOLN: 250.0000 mL | INTRAVENOUS | Status: DC | PRN

## 2016-12-05 MED ORDER — SODIUM CHLORIDE 0.9% FLUSH: 3.0000 mL | Freq: Two times a day (BID) | INTRAVENOUS | Status: DC

## 2016-12-05 MED ORDER — ASPIRIN 81 MG PO CHEW: 81.0000 mg | CHEWABLE_TABLET | ORAL | Status: DC

## 2016-12-05 MED ORDER — MIDAZOLAM HCL 2 MG/2ML IJ SOLN
INTRAMUSCULAR | Status: DC | PRN
  Administered 2016-12-05: 1 mg via INTRAVENOUS

## 2016-12-05 MED ORDER — SODIUM CHLORIDE 0.9 % WEIGHT BASED INFUSION
1.0000 mL/kg/h | INTRAVENOUS | Status: DC
  Administered 2016-12-05: 1 mL/kg/h via INTRAVENOUS

## 2016-12-05 MED ORDER — HYDRALAZINE HCL 20 MG/ML IJ SOLN: 5.0000 mg | INTRAMUSCULAR | Status: AC | PRN

## 2016-12-05 MED ORDER — BIVALIRUDIN 250 MG IV SOLR
INTRAVENOUS | Status: AC
  Filled 2016-12-05: qty 250

## 2016-12-05 MED ORDER — NITROGLYCERIN 5 MG/ML IV SOLN
INTRAVENOUS | Status: AC
  Filled 2016-12-05: qty 10

## 2016-12-05 MED ORDER — ENOXAPARIN SODIUM 40 MG/0.4ML ~~LOC~~ SOLN
40.0000 mg | SUBCUTANEOUS | Status: DC
  Administered 2016-12-05: 40 mg via SUBCUTANEOUS
  Filled 2016-12-05 (×2): qty 0.4

## 2016-12-05 MED ORDER — MIDAZOLAM HCL 2 MG/2ML IJ SOLN
INTRAMUSCULAR | Status: AC
  Filled 2016-12-05: qty 2

## 2016-12-05 MED ORDER — FENTANYL CITRATE (PF) 100 MCG/2ML IJ SOLN
INTRAMUSCULAR | Status: DC | PRN
  Administered 2016-12-05: 25 ug via INTRAVENOUS

## 2016-12-05 MED ORDER — SODIUM CHLORIDE 0.9 % WEIGHT BASED INFUSION
3.0000 mL/kg/h | INTRAVENOUS | Status: DC
  Administered 2016-12-05: 3 mL/kg/h via INTRAVENOUS

## 2016-12-05 MED ORDER — IOPAMIDOL (ISOVUE-300) INJECTION 61%
INTRAVENOUS | Status: DC | PRN
  Administered 2016-12-05: 110 mL via INTRA_ARTERIAL
  Administered 2016-12-05: 90 mL via INTRA_ARTERIAL

## 2016-12-05 MED ORDER — SODIUM CHLORIDE 0.9 % IV SOLN
INTRAVENOUS | Status: DC | PRN
  Administered 2016-12-05: 1.75 mg/kg/h via INTRAVENOUS

## 2016-12-05 SURGICAL SUPPLY — 20 items
BALLN TREK RX 2.5X12 (BALLOONS) ×3
BALLOON TREK RX 2.5X12 (BALLOONS) IMPLANT
CATH INFINITI 5FR ANG PIGTAIL (CATHETERS) ×2 IMPLANT
CATH INFINITI 5FR JL4 (CATHETERS) ×2 IMPLANT
CATH INFINITI 5FR JL5 (CATHETERS) ×2 IMPLANT
CATH INFINITI JR4 5F (CATHETERS) ×2 IMPLANT
CATH VISTA GUIDE 6FR XBLAD4 (CATHETERS) ×2 IMPLANT
DEVICE CLOSURE MYNXGRIP 6/7F (Vascular Products) ×2 IMPLANT
DEVICE INFLAT 30 PLUS (MISCELLANEOUS) ×2 IMPLANT
DEVICE SAFEGUARD 24CM (GAUZE/BANDAGES/DRESSINGS) ×2 IMPLANT
KIT MANI 3VAL PERCEP (MISCELLANEOUS) ×3 IMPLANT
NDL PERC 18GX7CM (NEEDLE) IMPLANT
NEEDLE PERC 18GX7CM (NEEDLE) ×3 IMPLANT
PACK CARDIAC CATH (CUSTOM PROCEDURE TRAY) ×3 IMPLANT
SHEATH AVANTI 5FR X 11CM (SHEATH) ×2 IMPLANT
SHEATH AVANTI 6FR X 11CM (SHEATH) ×2 IMPLANT
STENT XIENCE ALPINE RX 2.75X8 (Permanent Stent) ×2 IMPLANT
STENT XIENCE ALPINE RX 3.0X18 (Permanent Stent) ×2 IMPLANT
WIRE EMERALD 3MM-J .035X150CM (WIRE) ×2 IMPLANT
WIRE RUNTHROUGH .014X300CM (WIRE) ×2 IMPLANT

## 2016-12-05 NOTE — Progress Notes (Signed)
10 ml air removed from PAD. Site is stable, pulsed positive. I will continue to assess.

## 2016-12-05 NOTE — Progress Notes (Signed)
Remainder air released from PAD, Site is non-angry. No hematoma noted, no drainage from incision. 2x2 and occlusive dressing applied. I will continue to assess.

## 2016-12-05 NOTE — Progress Notes (Signed)
[facility] NAME: Gregory Humphrey    MR#:  [id number]  DATE OF BIRTH:  May 26, 1941  SUBJECTIVE:  CHIEF COMPLAINT:   Chief Complaint  Patient presents with  . Chest Pain     Came with chest pain, high troponin, taken for cath today- 2 stents placed.   No new complains.  REVIEW OF SYSTEMS:  CONSTITUTIONAL: No fever, fatigue or weakness.  EYES: No blurred or double vision.  EARS, NOSE, AND THROAT: No tinnitus or ear pain.  RESPIRATORY: No cough, shortness of breath, wheezing or hemoptysis.  CARDIOVASCULAR: No chest pain, orthopnea, edema.  GASTROINTESTINAL: No nausea, vomiting, diarrhea or abdominal pain.  GENITOURINARY: No dysuria, hematuria.  ENDOCRINE: No polyuria, nocturia,  HEMATOLOGY: No anemia, easy bruising or bleeding SKIN: No rash or lesion. MUSCULOSKELETAL: No joint pain or arthritis.   NEUROLOGIC: No tingling, numbness, weakness.  PSYCHIATRY: No anxiety or depression.   ROS  DRUG ALLERGIES:  No Known Allergies  VITALS:  Blood pressure (!) 144/82, pulse 78, temperature 98.8 F (37.1 C), temperature source Oral, resp. rate 18, height 5\' 10"  (1.778 m), weight 78.5 kg (173 lb), SpO2 95 %.  PHYSICAL EXAMINATION:  GENERAL:  76 y.o.-year-old patient lying in the bed with no acute distress.  EYES: Pupils equal, round, reactive to light and accommodation. No scleral icterus. Extraocular muscles intact.  HEENT: Head atraumatic, normocephalic. Oropharynx and nasopharynx clear.  NECK:  Supple, no jugular venous distention. No thyroid enlargement, no tenderness.  LUNGS: Normal breath sounds bilaterally, no wheezing, rales,rhonchi or crepitation. No use of accessory muscles of respiration.  CARDIOVASCULAR: S1, S2 normal. No murmurs, rubs, or gallops.  ABDOMEN: Soft, nontender, nondistended. Bowel sounds present. No organomegaly or mass.  EXTREMITIES: No pedal edema, cyanosis, or clubbing.  NEUROLOGIC: Cranial nerves II  through XII are intact. Muscle strength 5/5 in all extremities. Sensation intact. Gait not checked.  PSYCHIATRIC: The patient is alert and oriented x 3.  SKIN: No obvious rash, lesion, or ulcer.   Physical Exam LABORATORY PANEL:   CBC  Recent Labs Lab 12/05/16 0525  WBC 6.5  HGB 15.2  HCT 43.1  PLT 143*   ------------------------------------------------------------------------------------------------------------------  Chemistries   Recent Labs Lab 12/05/16 0525  NA 141  K 3.8  CL 108  CO2 25  GLUCOSE 116*  BUN 17  CREATININE 0.88  CALCIUM 9.1   ------------------------------------------------------------------------------------------------------------------  Cardiac Enzymes  Recent Labs Lab 12/04/16 1617 12/04/16 2006  TROPONINI 3.81* 3.54*   ------------------------------------------------------------------------------------------------------------------  RADIOLOGY:  Dg Chest 2 View  Result Date: 12/04/2016 CLINICAL DATA:  Chest pain EXAM: CHEST  2 VIEW COMPARISON:  09/30/2012 FINDINGS: Heart and mediastinal contours are within normal limits. No focal opacities or effusions. No acute bony abnormality. IMPRESSION: No active cardiopulmonary disease. Electronically Signed   By: Rolm Baptise M.D.   On: 12/04/2016 10:08   Ct Angio Chest Aorta W And/or Wo Contrast  Result Date: 12/04/2016 CLINICAL DATA:  Mid chest pain radiating to back. EXAM: CT ANGIOGRAPHY CHEST WITH CONTRAST TECHNIQUE: Multidetector CT imaging of the chest was performed using the standard protocol during bolus administration of intravenous contrast. Multiplanar CT image reconstructions and MIPs were obtained to evaluate the vascular anatomy. CONTRAST:  100 cc Isovue 370 IV COMPARISON:  Chest x-ray earlier today. FINDINGS: Cardiovascular: Heart is normal size. Aorta is normal caliber. No dissection. Scattered aortic and coronary artery calcifications. No filling defects in the pulmonary arteries to  suggest pulmonary emboli. Mediastinum/Nodes: No mediastinal, hilar, or  axillary adenopathy. Lungs/Pleura: Lungs are clear. No focal airspace opacities or suspicious nodules. No effusions. Upper Abdomen: Multiple small layering gallstones within the gallbladder. Musculoskeletal: Chest wall soft tissues are unremarkable. No acute bony abnormality or focal bone lesion. Review of the MIP images confirms the above findings. IMPRESSION: No evidence of aortic aneurysm, dissection or pulmonary embolus. No acute cardiopulmonary disease. Cholelithiasis. Electronically Signed   By: Rolm Baptise M.D.   On: 12/04/2016 11:45    ASSESSMENT AND PLAN:   Active Problems:   NSTEMI (non-ST elevated myocardial infarction) ([place])   1. NSTEMI. Patient started on heparin drip by ER physician and given an aspirin. on metoprolol. And Lipitor  2 stents placed in Cath. 2.  Essential hypertension continue usual medications 3. Type 2 diabetes mellitus hold Glucophage with CT scan today and cardiac catheter tomorrow.,. History of subdural hematoma with stroke and craniotomy 5 years ago. Patient has short-term memory loss. Watch closely on heparin drip 5. History of seizures on Lamictal     All the records are reviewed and case discussed with Care Management/Social Workerr. Management plans discussed with the patient, family and they are in agreement.  CODE STATUS: full  TOTAL TIME TAKING CARE OF THIS PATIENT: 35 minutes.     POSSIBLE D/C IN 1-2 DAYS, DEPENDING ON CLINICAL CONDITION.   Vaughan Basta M.D on 12/05/2016   Between 7am to 6pm - Pager - [phone]  After 6pm go to [email or web address] - password [email or web address] [facility]  Office  [phone]  CC: Primary care physician; Kirk Ruths., MD  Note: This dictation was prepared with Dragon dictation along with smaller phrase technology. Any transcriptional errors that result from this process are unintentional.

## 2016-12-05 NOTE — Progress Notes (Signed)
Pt returns from cardiac cath, VSS, no complaints of pain, SB on the monitor. Right femoral site has PAD in place, this site is clean dry and intact. Small amount of bruising noted and outline.  I will slowly decrease volume until empty and discard. I will continue to assess.

## 2016-12-05 NOTE — Progress Notes (Signed)
10 ml of air removed from PAD, site is stable. I will continue to assess.

## 2016-12-06 LAB — BASIC METABOLIC PANEL
Anion gap: 5 (ref 5–15)
BUN: 12 mg/dL (ref 6–20)
CALCIUM: 9 mg/dL (ref 8.9–10.3)
CO2: 25 mmol/L (ref 22–32)
CREATININE: 1.04 mg/dL (ref 0.61–1.24)
Chloride: 111 mmol/L (ref 101–111)
GFR calc non Af Amer: >60 mL/min (ref >60)
Glucose, Bld: 126 mg/dL — ABNORMAL HIGH (ref 65–99)
Potassium: 3.9 mmol/L (ref 3.5–5.1)
Sodium: 141 mmol/L (ref 135–145)

## 2016-12-06 LAB — CBC
HEMATOCRIT: 43.7 % (ref 40.0–52.0)
Hemoglobin: 15.4 g/dL (ref 13.0–18.0)
MCH: 32.5 pg (ref 26.0–34.0)
MCHC: 35.2 g/dL (ref 32.0–36.0)
MCV: 92.1 fL (ref 80.0–100.0)
Platelets: 133 10*3/uL — ABNORMAL LOW (ref 150–440)
RBC: 4.74 MIL/uL (ref 4.40–5.90)
RDW: 13.2 % (ref 11.5–14.5)
WBC: 7 10*3/uL (ref 3.8–10.6)

## 2016-12-06 LAB — GLUCOSE, CAPILLARY: Glucose-Capillary: 133 mg/dL — ABNORMAL HIGH (ref 65–99)

## 2016-12-06 MED ORDER — ASPIRIN EC 81 MG PO TBEC
81.0000 mg | DELAYED_RELEASE_TABLET | Freq: Every day | ORAL | Status: DC
  Administered 2016-12-06: 81 mg via ORAL
  Filled 2016-12-06: qty 1

## 2016-12-06 MED ORDER — ATORVASTATIN CALCIUM 40 MG PO TABS: 40.0000 mg | ORAL_TABLET | Freq: Every day | ORAL | 2 refills | Status: DC

## 2016-12-06 MED ORDER — ASPIRIN 81 MG PO TBEC: 81.0000 mg | DELAYED_RELEASE_TABLET | Freq: Every day | ORAL | 2 refills | Status: DC

## 2016-12-06 MED ORDER — CLOPIDOGREL BISULFATE 75 MG PO TABS: 75.0000 mg | ORAL_TABLET | Freq: Every day | ORAL | 2 refills | Status: DC

## 2016-12-06 NOTE — Progress Notes (Signed)
Gregory Humphrey  SUBJECTIVE: denies any chest pain or shortness of breath last night   Vitals:   12/05/16 1230 12/05/16 1312 12/05/16 1924 12/06/16 0459  BP: 121/76 124/68 (!) 144/82 (!) 148/79  Pulse: (!) 54 (!) 56 78 67  Resp: 16 20 18 18   Temp:  98.1 F (36.7 C) 98.8 F (37.1 C) 98.4 F (36.9 C)  TempSrc:  Oral Oral Oral  SpO2: 98% 96% 95% 94%  Weight:      Height:        Intake/Output Summary (Last 24 hours) at 12/06/16 0742 Last data filed at 12/06/16 0730  Gross per 24 hour  Intake                0 ml  Output             1007 ml  Net            -1007 ml    LABS: Basic Metabolic Panel:  Recent Labs  12/05/16 0525 12/06/16 0434  NA 141 141  K 3.8 3.9  CL 108 111  CO2 25 25  GLUCOSE 116* 126*  BUN 17 12  CREATININE 0.88 1.04  CALCIUM 9.1 9.0   Liver Function Tests: No results for input(s): AST, ALT, ALKPHOS, BILITOT, PROT, ALBUMIN in the last 72 hours. No results for input(s): LIPASE, AMYLASE in the last 72 hours. CBC:  Recent Labs  12/05/16 0525 12/06/16 0434  WBC 6.5 7.0  HGB 15.2 15.4  HCT 43.1 43.7  MCV 92.0 92.1  PLT 143* 133*   Cardiac Enzymes:  Recent Labs  12/04/16 0949 12/04/16 1617 12/04/16 2006  TROPONINI 2.51* 3.81* 3.54*   BNP: Invalid input(s): POCBNP D-Dimer: No results for input(s): DDIMER in the last 72 hours. Hemoglobin A1C: No results for input(s): HGBA1C in the last 72 hours. Fasting Lipid Panel:  Recent Labs  12/05/16 0525  CHOL 113  HDL 37*  LDLCALC 56  TRIG 101  CHOLHDL 3.1   Thyroid Function Tests: No results for input(s): TSH, T4TOTAL, T3FREE, THYROIDAB in the last 72 hours.  Invalid input(s): FREET3 Anemia Panel: No results for input(s): VITAMINB12, FOLATE, FERRITIN, TIBC, IRON, RETICCTPCT in the last 72 hours.   Physical Exam: Blood pressure (!) 148/79, pulse 67, temperature 98.4 F (36.9 C), temperature source Oral, resp. rate 18, height 5\' 10"  (1.778 m),  weight 173 lb (78.5 kg), SpO2 94 %.   Wt Readings from Last 1 Encounters:  12/05/16 173 lb (78.5 kg)     General appearance: alert and cooperative Resp: clear to auscultation bilaterally Cardio: regular rate and rhythm GI: soft, non-tender; bowel sounds normal; no masses,  no organomegaly Pulses: 2+ and symmetric Catheter site without hematoma or bruit. Distal pulses on the right 2+. Neurologic: Grossly normal  TELEMETRY: Reviewed telemetry pt in normal sinus rhythm with occasional sinus bradycardia  ASSESSMENT AND PLAN:  Active Problems:   NSTEMI (non-ST elevated myocardial infarction) ([facility])-ruled in for non-ST elevation myocardial infarction. Cardiac catheterization revealed a distal PDA lesion is likely the infarct related vessel. The vessel is small with limited distal myocardium in jeopardy. Patient had admitted the LAD which had an 80% stenosis. Underwent placement of a 2 drug-eluting stents with good result.patient is doing well post PCI. Would ambulate and discharged today on aspirin 81 mg daily, Plavix 75 mg day,atorvastatin 40 mg daily, hydralazine 10 mg 3 times a day,lisinopril 40 mg daily,and metoprolol 50 mg XL daily. Patient is referred to phase II  cardiac rehabilitation. Will follow-up in our office in one week.    Teodoro Spray, MD, [facility] 12/06/2016 7:42 AM

## 2016-12-06 NOTE — Discharge Instructions (Signed)
Heart healthy and ADA diet. °

## 2016-12-06 NOTE — Discharge Summary (Signed)
Gregory Humphrey at [place] NAME: Gregory Humphrey    MR#:  [id number]  DATE OF BIRTH:  03/05/1941  DATE OF ADMISSION:  12/04/2016   ADMITTING PHYSICIAN: Loletha Grayer, MD  DATE OF DISCHARGE: 12/06/2016 12:10 PM  PRIMARY CARE PHYSICIAN: Kirk Ruths., MD   ADMISSION DIAGNOSIS:  NSTEMI (non-ST elevated myocardial infarction) ([place]) [I21.4] DISCHARGE DIAGNOSIS:  Active Problems:   NSTEMI (non-ST elevated myocardial infarction) ([place])  SECONDARY DIAGNOSIS:   Past Medical History:  Diagnosis Date  . Diabetes mellitus without complication ([place])   . Gilbert's syndrome   . Hypertension   . Kidney stones   . Olecranon bursitis   . Seizures ([place])   . Stroke ([place])   . Subdural hematoma ([place])    HOSPITAL COURSE:   1. NSTEMI. Patient was started on heparin drip by ER physician and given an aspirin. on metoprolol. And Lipitor  S/p 2 stents placement during cardiac cath yesterday. Continue aspirin, Plavix and Lipitor. Patient is referred to phase II cardiac rehabilitation per Dr. Ubaldo Glassing.   2. Essential hypertension. continue hydralazine 10 mg 3 times a day,lisinopril 40 mg daily,and metoprolol 50 mg XL daily.   3. Type 2 diabetes mellitus hold Glucophage with CT scan and cardiac catheter. History of subdural hematoma with stroke and craniotomy 5 years ago. Patient has short-term memory loss. 5. History of seizures on Lamictal   DISCHARGE CONDITIONS:  Stable, discharged to home today. CONSULTS OBTAINED:  Treatment Team:  Teodoro Spray, MD DRUG ALLERGIES:  No Known Allergies DISCHARGE MEDICATIONS:   Allergies as of 12/06/2016   No Known Allergies     Medication List    TAKE these medications   aspirin 81 MG EC tablet Take 1 tablet (81 mg total) by mouth daily.   atorvastatin 40 MG tablet Commonly known as:  LIPITOR Take 1 tablet (40 mg total) by mouth daily at 6 PM.   clopidogrel 75 MG tablet Commonly known as:   PLAVIX Take 1 tablet (75 mg total) by mouth daily with breakfast. Start taking on:  12/07/2016   hydrALAZINE 10 MG tablet Commonly known as:  APRESOLINE Take 10 mg by mouth 3 (three) times daily.   lamoTRIgine 25 MG tablet Commonly known as:  LAMICTAL Take 75 mg by mouth daily.   lisinopril 40 MG tablet Commonly known as:  PRINIVIL,ZESTRIL Take 40 mg by mouth daily.   metFORMIN 500 MG tablet Commonly known as:  GLUCOPHAGE Take 500 mg by mouth 2 (two) times daily.   metoprolol succinate 50 MG 24 hr tablet Commonly known as:  TOPROL-XL Take 50 mg by mouth daily.   multivitamin tablet Take 1 tablet by mouth daily.   STOOL SOFTENER PO Take 2 capsules by mouth daily as needed.   vitamin C 500 MG tablet Commonly known as:  ASCORBIC ACID Take 500 mg by mouth daily.        DISCHARGE INSTRUCTIONS:  See AVS.  If you experience worsening of your admission symptoms, develop shortness of breath, life threatening emergency, suicidal or homicidal thoughts you must seek medical attention immediately by calling 911 or calling your MD immediately  if symptoms less severe.  You Must read complete instructions/literature along with all the possible adverse reactions/side effects for all the Medicines you take and that have been prescribed to you. Take any new Medicines after you have completely understood and accpet all the possible adverse reactions/side effects.   Please note  You were cared for by  a hospitalist during your hospital stay. If you have any questions about your discharge medications or the care you received while you were in the hospital after you are discharged, you can call the unit and asked to speak with the hospitalist on call if the hospitalist that took care of you is not available. Once you are discharged, your primary care physician will handle any further medical issues. Please note that NO REFILLS for any discharge medications will be authorized once you are  discharged, as it is imperative that you return to your primary care physician (or establish a relationship with a primary care physician if you do not have one) for your aftercare needs so that they can reassess your need for medications and monitor your lab values.    On the day of Discharge:  VITAL SIGNS:  Blood pressure (!) 148/79, pulse 67, temperature 98.4 F (36.9 C), temperature source Oral, resp. rate 18, height 5\' 10"  (1.778 m), weight 173 lb (78.5 kg), SpO2 94 %. PHYSICAL EXAMINATION:  GENERAL:  76 y.o.-year-old patient lying in the bed with no acute distress.  EYES: Pupils equal, round, reactive to light and accommodation. No scleral icterus. Extraocular muscles intact.  HEENT: Head atraumatic, normocephalic. Oropharynx and nasopharynx clear.  NECK:  Supple, no jugular venous distention. No thyroid enlargement, no tenderness.  LUNGS: Normal breath sounds bilaterally, no wheezing, rales,rhonchi or crepitation. No use of accessory muscles of respiration.  CARDIOVASCULAR: S1, S2 normal. No murmurs, rubs, or gallops.  ABDOMEN: Soft, non-tender, non-distended. Bowel sounds present. No organomegaly or mass.  EXTREMITIES: No pedal edema, cyanosis, or clubbing.  NEUROLOGIC: Cranial nerves II through XII are intact. Muscle strength 5/5 in all extremities. Sensation intact. Gait not checked.  PSYCHIATRIC: The patient is alert and oriented x 3.  SKIN: No obvious rash, lesion, or ulcer.  DATA REVIEW:   CBC  Recent Labs Lab 12/06/16 0434  WBC 7.0  HGB 15.4  HCT 43.7  PLT 133*    Chemistries   Recent Labs Lab 12/06/16 0434  NA 141  K 3.9  CL 111  CO2 25  GLUCOSE 126*  BUN 12  CREATININE 1.04  CALCIUM 9.0     Microbiology Results  Results for orders placed or performed during the hospital encounter of 12/04/16  MRSA PCR Screening     Status: None   Collection Time: 12/04/16  4:27 PM  Result Value Ref Range Status   MRSA by PCR NEGATIVE NEGATIVE Final    Comment:         The GeneXpert MRSA Assay (FDA approved for NASAL specimens only), is one component of a comprehensive MRSA colonization surveillance program. It is not intended to diagnose MRSA infection nor to guide or monitor treatment for MRSA infections.     RADIOLOGY:  No results found.   Management plans discussed with the patient, his wife and they are in agreement.  CODE STATUS:  Code Status History    Date Active Date Inactive Code Status Order ID Comments User Context   12/04/2016 12:42 PM 12/06/2016  4:00 PM Full Code [id number]  Loletha Grayer, MD ED   11/05/2011  2:30 AM 11/11/2011  1:48 PM DNR [id number]  Karen Kays, RN Inpatient   10/07/2011 11:29 PM 10/24/2011  6:50 PM DNR [id number]  Doree Fudge, MD Inpatient   10/07/2011  9:20 PM 10/07/2011 11:29 PM DNR [id number]  Cherre Blanc, MD ED      TOTAL TIME TAKING CARE OF THIS PATIENT: 33 minutes.  Demetrios Loll M.D on 12/06/2016 at 5:02 PM  Between 7am to 6pm - Pager - [phone]  After 6pm go to [email or web address] - Proofreader  [employer] [facility] [place]  Office  [phone]  CC: Primary care physician; Kirk Ruths., MD   Note: This dictation was prepared with Dragon dictation along with smaller phrase technology. Any transcriptional errors that result from this process are unintentional.

## 2016-12-12 DIAGNOSIS — E784 Other hyperlipidemia: Secondary | ICD-10-CM | POA: Diagnosis not present

## 2016-12-12 DIAGNOSIS — I69319 Unspecified symptoms and signs involving cognitive functions following cerebral infarction: Secondary | ICD-10-CM | POA: Diagnosis not present

## 2016-12-12 DIAGNOSIS — G40909 Epilepsy, unspecified, not intractable, without status epilepticus: Secondary | ICD-10-CM | POA: Diagnosis not present

## 2016-12-12 DIAGNOSIS — I2510 Atherosclerotic heart disease of native coronary artery without angina pectoris: Secondary | ICD-10-CM | POA: Diagnosis not present

## 2016-12-12 DIAGNOSIS — E1122 Type 2 diabetes mellitus with diabetic chronic kidney disease: Secondary | ICD-10-CM | POA: Diagnosis not present

## 2016-12-12 DIAGNOSIS — I10 Essential (primary) hypertension: Secondary | ICD-10-CM | POA: Diagnosis not present

## 2016-12-12 DIAGNOSIS — Z8679 Personal history of other diseases of the circulatory system: Secondary | ICD-10-CM | POA: Diagnosis not present

## 2016-12-12 DIAGNOSIS — N182 Chronic kidney disease, stage 2 (mild): Secondary | ICD-10-CM | POA: Diagnosis not present

## 2016-12-12 DIAGNOSIS — Z9861 Coronary angioplasty status: Secondary | ICD-10-CM | POA: Diagnosis not present

## 2017-02-02 ENCOUNTER — Encounter: Payer: Self-pay | Admitting: *Deleted

## 2017-02-24 DIAGNOSIS — Z85828 Personal history of other malignant neoplasm of skin: Secondary | ICD-10-CM | POA: Diagnosis not present

## 2017-02-24 DIAGNOSIS — X32XXXA Exposure to sunlight, initial encounter: Secondary | ICD-10-CM | POA: Diagnosis not present

## 2017-02-24 DIAGNOSIS — L570 Actinic keratosis: Secondary | ICD-10-CM | POA: Diagnosis not present

## 2017-02-24 DIAGNOSIS — D225 Melanocytic nevi of trunk: Secondary | ICD-10-CM | POA: Diagnosis not present

## 2017-02-24 DIAGNOSIS — Z85820 Personal history of malignant melanoma of skin: Secondary | ICD-10-CM | POA: Diagnosis not present

## 2017-02-24 DIAGNOSIS — L821 Other seborrheic keratosis: Secondary | ICD-10-CM | POA: Diagnosis not present

## 2017-03-09 DIAGNOSIS — E113393 Type 2 diabetes mellitus with moderate nonproliferative diabetic retinopathy without macular edema, bilateral: Secondary | ICD-10-CM | POA: Diagnosis not present

## 2017-03-12 DIAGNOSIS — I214 Non-ST elevation (NSTEMI) myocardial infarction: Secondary | ICD-10-CM | POA: Diagnosis not present

## 2017-03-12 DIAGNOSIS — I2510 Atherosclerotic heart disease of native coronary artery without angina pectoris: Secondary | ICD-10-CM | POA: Diagnosis not present

## 2017-03-12 DIAGNOSIS — I10 Essential (primary) hypertension: Secondary | ICD-10-CM | POA: Diagnosis not present

## 2017-03-12 DIAGNOSIS — E1122 Type 2 diabetes mellitus with diabetic chronic kidney disease: Secondary | ICD-10-CM | POA: Diagnosis not present

## 2017-03-12 DIAGNOSIS — N182 Chronic kidney disease, stage 2 (mild): Secondary | ICD-10-CM | POA: Diagnosis not present

## 2017-04-23 DIAGNOSIS — E1122 Type 2 diabetes mellitus with diabetic chronic kidney disease: Secondary | ICD-10-CM | POA: Diagnosis not present

## 2017-04-23 DIAGNOSIS — N182 Chronic kidney disease, stage 2 (mild): Secondary | ICD-10-CM | POA: Diagnosis not present

## 2017-04-23 DIAGNOSIS — Z0000 Encounter for general adult medical examination without abnormal findings: Secondary | ICD-10-CM | POA: Diagnosis not present

## 2017-04-23 DIAGNOSIS — G40909 Epilepsy, unspecified, not intractable, without status epilepticus: Secondary | ICD-10-CM | POA: Diagnosis not present

## 2017-04-23 DIAGNOSIS — I10 Essential (primary) hypertension: Secondary | ICD-10-CM | POA: Diagnosis not present

## 2017-04-30 DIAGNOSIS — E1122 Type 2 diabetes mellitus with diabetic chronic kidney disease: Secondary | ICD-10-CM | POA: Diagnosis not present

## 2017-04-30 DIAGNOSIS — I2510 Atherosclerotic heart disease of native coronary artery without angina pectoris: Secondary | ICD-10-CM | POA: Diagnosis not present

## 2017-04-30 DIAGNOSIS — N182 Chronic kidney disease, stage 2 (mild): Secondary | ICD-10-CM | POA: Diagnosis not present

## 2017-04-30 DIAGNOSIS — I69319 Unspecified symptoms and signs involving cognitive functions following cerebral infarction: Secondary | ICD-10-CM | POA: Diagnosis not present

## 2017-04-30 DIAGNOSIS — I10 Essential (primary) hypertension: Secondary | ICD-10-CM | POA: Diagnosis not present

## 2017-04-30 DIAGNOSIS — G40909 Epilepsy, unspecified, not intractable, without status epilepticus: Secondary | ICD-10-CM | POA: Diagnosis not present

## 2017-08-24 DIAGNOSIS — H903 Sensorineural hearing loss, bilateral: Secondary | ICD-10-CM | POA: Diagnosis not present

## 2017-08-24 DIAGNOSIS — H6121 Impacted cerumen, right ear: Secondary | ICD-10-CM | POA: Diagnosis not present

## 2017-08-25 DIAGNOSIS — I10 Essential (primary) hypertension: Secondary | ICD-10-CM | POA: Diagnosis not present

## 2017-08-25 DIAGNOSIS — E1122 Type 2 diabetes mellitus with diabetic chronic kidney disease: Secondary | ICD-10-CM | POA: Diagnosis not present

## 2017-08-25 DIAGNOSIS — E7800 Pure hypercholesterolemia, unspecified: Secondary | ICD-10-CM | POA: Diagnosis not present

## 2017-08-25 DIAGNOSIS — N182 Chronic kidney disease, stage 2 (mild): Secondary | ICD-10-CM | POA: Diagnosis not present

## 2017-09-08 DIAGNOSIS — Z0000 Encounter for general adult medical examination without abnormal findings: Secondary | ICD-10-CM | POA: Diagnosis not present

## 2017-09-08 DIAGNOSIS — I69319 Unspecified symptoms and signs involving cognitive functions following cerebral infarction: Secondary | ICD-10-CM | POA: Diagnosis not present

## 2017-09-08 DIAGNOSIS — I2510 Atherosclerotic heart disease of native coronary artery without angina pectoris: Secondary | ICD-10-CM | POA: Diagnosis not present

## 2017-09-08 DIAGNOSIS — I10 Essential (primary) hypertension: Secondary | ICD-10-CM | POA: Diagnosis not present

## 2017-09-08 DIAGNOSIS — E1122 Type 2 diabetes mellitus with diabetic chronic kidney disease: Secondary | ICD-10-CM | POA: Diagnosis not present

## 2017-09-08 DIAGNOSIS — N182 Chronic kidney disease, stage 2 (mild): Secondary | ICD-10-CM | POA: Diagnosis not present

## 2017-09-08 DIAGNOSIS — G40909 Epilepsy, unspecified, not intractable, without status epilepticus: Secondary | ICD-10-CM | POA: Diagnosis not present

## 2017-09-17 ENCOUNTER — Emergency Department
Admission: EM | Admit: 2017-09-17 | Discharge: 2017-09-17 | Disposition: A | Discharge status: Home / Self Care | Payer: PPO | Attending: Emergency Medicine | Admitting: Emergency Medicine

## 2017-09-17 ENCOUNTER — Encounter: Payer: Self-pay | Admitting: Emergency Medicine

## 2017-09-17 ENCOUNTER — Emergency Department: Payer: PPO

## 2017-09-17 DIAGNOSIS — Z7902 Long term (current) use of antithrombotics/antiplatelets: Secondary | ICD-10-CM | POA: Insufficient documentation

## 2017-09-17 DIAGNOSIS — Z7984 Long term (current) use of oral hypoglycemic drugs: Secondary | ICD-10-CM | POA: Insufficient documentation

## 2017-09-17 DIAGNOSIS — E119 Type 2 diabetes mellitus without complications: Secondary | ICD-10-CM | POA: Insufficient documentation

## 2017-09-17 DIAGNOSIS — I10 Essential (primary) hypertension: Secondary | ICD-10-CM | POA: Diagnosis not present

## 2017-09-17 DIAGNOSIS — Z8673 Personal history of transient ischemic attack (TIA), and cerebral infarction without residual deficits: Secondary | ICD-10-CM | POA: Insufficient documentation

## 2017-09-17 DIAGNOSIS — Z79899 Other long term (current) drug therapy: Secondary | ICD-10-CM | POA: Diagnosis not present

## 2017-09-17 DIAGNOSIS — R319 Hematuria, unspecified: Secondary | ICD-10-CM | POA: Insufficient documentation

## 2017-09-17 DIAGNOSIS — Z7982 Long term (current) use of aspirin: Secondary | ICD-10-CM | POA: Insufficient documentation

## 2017-09-17 DIAGNOSIS — I252 Old myocardial infarction: Secondary | ICD-10-CM | POA: Insufficient documentation

## 2017-09-17 DIAGNOSIS — N200 Calculus of kidney: Secondary | ICD-10-CM | POA: Diagnosis not present

## 2017-09-17 LAB — URINALYSIS, COMPLETE (UACMP) WITH MICROSCOPIC
BACTERIA UA: NONE SEEN
Bilirubin Urine: NEGATIVE
Glucose, UA: NEGATIVE mg/dL
KETONES UR: NEGATIVE mg/dL
LEUKOCYTES UA: NEGATIVE
Nitrite: NEGATIVE
PROTEIN: 100 mg/dL — AB
SQUAMOUS EPITHELIAL / LPF: NONE SEEN
Specific Gravity, Urine: 1.013 (ref 1.005–1.030)
pH: 6 (ref 5.0–8.0)

## 2017-09-17 LAB — CBC
HEMATOCRIT: 43.3 % (ref 40.0–52.0)
HEMOGLOBIN: 14.9 g/dL (ref 13.0–18.0)
MCH: 32.8 pg (ref 26.0–34.0)
MCHC: 34.5 g/dL (ref 32.0–36.0)
MCV: 95.2 fL (ref 80.0–100.0)
Platelets: 150 10*3/uL (ref 150–440)
RBC: 4.55 MIL/uL (ref 4.40–5.90)
RDW: 13.3 % (ref 11.5–14.5)
WBC: 5.5 10*3/uL (ref 3.8–10.6)

## 2017-09-17 LAB — BASIC METABOLIC PANEL
ANION GAP: 8 (ref 5–15)
BUN: 21 mg/dL — ABNORMAL HIGH (ref 6–20)
CHLORIDE: 103 mmol/L (ref 101–111)
CO2: 27 mmol/L (ref 22–32)
CREATININE: 0.93 mg/dL (ref 0.61–1.24)
Calcium: 9.4 mg/dL (ref 8.9–10.3)
GFR calc non Af Amer: >60 mL/min (ref >60)
Glucose, Bld: 144 mg/dL — ABNORMAL HIGH (ref 65–99)
POTASSIUM: 3.8 mmol/L (ref 3.5–5.1)
SODIUM: 138 mmol/L (ref 135–145)

## 2017-09-17 LAB — PROTIME-INR
INR: 0.98
PROTHROMBIN TIME: 12.9 s (ref 11.4–15.2)

## 2017-09-17 MED ORDER — CEPHALEXIN 500 MG PO CAPS: 500.0000 mg | ORAL_CAPSULE | Freq: Three times a day (TID) | ORAL | 0 refills | Status: AC

## 2017-09-17 MED ORDER — CEPHALEXIN 500 MG PO CAPS
500.0000 mg | ORAL_CAPSULE | Freq: Once | ORAL | Status: AC
  Administered 2017-09-17: 500 mg via ORAL
  Filled 2017-09-17: qty 1

## 2017-09-17 NOTE — Discharge Instructions (Addendum)
Return to the emergency room for any new or worrisome symptoms including severe abdominal pain, lightheadedness or ongoing bleeding that is significant.  Follow  closely with urology.

## 2017-09-17 NOTE — ED Provider Notes (Addendum)
[facility] Emergency Department Provider Note  ____________________________________________   I have reviewed the triage vital signs and the nursing notes.   HISTORY  Chief Complaint Hematuria    HPI Gregory Humphrey is a 75 y.o. male who presents today complaining of hematuria, painless, started today.  No abdominal pain no fever no chills no dysuria no urinary frequency no UTI symptoms no vomiting, no easy bruising or bleeding, no weight loss, did have a kidney stone 5 ago, this does not feel like that.  Patient Nothing makes it better nothing makes it worse no alleviating or aggravating symptoms,   Past Medical History:  Diagnosis Date  . Diabetes mellitus without complication ([place])   . Gilbert's syndrome   . Hypertension   . Kidney stones   . Olecranon bursitis   . Seizures ([place])   . Stroke ([place])   . Subdural hematoma [facility])     Patient Active Problem List   Diagnosis Date Noted  . NSTEMI (non-ST elevated myocardial infarction) ([place]) 12/04/2016  . Left hemiparesis ([place]) 11/11/2011  . Tracheostomy in place [facility]) 11/11/2011  . PNA (pneumonia) 11/04/2011  . Chronic respiratory failure ([place]) 10/23/2011  . Hyperglycemia 10/23/2011  . Hypernatremia 10/23/2011  . Purulent bronchitis ([place]) 10/23/2011  . Physical deconditioning 10/23/2011  . ICH (intracerebral hemorrhage) ([place]) 10/08/2011  . CARBOHYDRATE METABOLISM DISORDER 05/30/2008  . HYPERCHOLESTEROLEMIA 05/30/2008  . GILBERT'S SYNDROME 05/30/2008  . Essential hypertension, benign 05/30/2008  . RENAL CALCULUS, HX OF 05/30/2008    Past Surgical History:  Procedure Laterality Date  . [place]    Prior to Admission medications   Medication Sig Start Date End Date Taking? Authorizing Provider  aspirin EC 81 MG EC tablet Take 1 tablet (81 mg total) by mouth daily. 12/06/16   Demetrios Loll, MD  atorvastatin (LIPITOR) 40 MG tablet Take 1 tablet (40 mg total) by mouth daily at 6 PM.  12/06/16   Demetrios Loll, MD  clopidogrel (PLAVIX) 75 MG tablet Take 1 tablet (75 mg total) by mouth daily with breakfast. 12/07/16   Demetrios Loll, MD  Docusate Calcium (STOOL SOFTENER PO) Take 2 capsules by mouth daily as needed.    [provider]  hydrALAZINE (APRESOLINE) 10 MG tablet Take 10 mg by mouth 3 (three) times daily. 10/23/15   [provider]  lamoTRIgine (LAMICTAL) 25 MG tablet Take 75 mg by mouth daily.  10/23/15   [provider]  lisinopril (PRINIVIL,ZESTRIL) 40 MG tablet Take 40 mg by mouth daily. 01/21/16   [provider]  metFORMIN (GLUCOPHAGE) 500 MG tablet Take 500 mg by mouth 2 (two) times daily. 04/08/16 04/08/17  [provider]  metoprolol succinate (TOPROL-XL) 50 MG 24 hr tablet Take 50 mg by mouth daily. 10/23/15   [provider]  Multiple Vitamin (MULTIVITAMIN) tablet Take 1 tablet by mouth daily.    [provider]  vitamin C (ASCORBIC ACID) 500 MG tablet Take 500 mg by mouth daily.    [provider]    Allergies Patient has no known allergies.  Family History  Problem Relation Age of Onset  . Stroke Mother   . Hypertension Mother   . Diabetes Mother   . Cancer Father        bone  . Alcohol abuse Brother        past  . Diabetes Maternal Uncle   . Stroke Maternal Grandfather     Social History Social History   Tobacco Use  .  Smoking status: Never Smoker  . Smokeless tobacco: Never Used  Substance Use Topics  . Alcohol use: No    Alcohol/week: 0.0 oz  . Drug use: No    Review of Systems Constitutional: No fever/chills Eyes: No visual changes. ENT: No sore throat. No stiff neck no neck pain Cardiovascular: Denies chest pain. Respiratory: Denies shortness of breath. Gastrointestinal:   no vomiting.  No diarrhea.  No constipation. Genitourinary: Negative for dysuria. Musculoskeletal: Negative lower extremity swelling Skin: Negative for rash. Neurological: Negative for severe  headaches, focal weakness or numbness.   ____________________________________________   PHYSICAL EXAM:  VITAL SIGNS: ED Triage Vitals [09/17/17 1939]  Enc Vitals Group     BP (!) 160/80     Pulse Rate 66     Resp 20     Temp 97.8 F (36.6 C)     Temp Source Oral     SpO2 98 %     Weight 170 lb (77.1 kg)     Height 5\' 10"  (1.778 m)     Head Circumference      Peak Flow      Pain Score      Pain Loc      Pain Edu?      Excl. in [place]?     Constitutional: Alert and oriented. Well appearing and in no acute distress. Eyes: Conjunctivae are normal Head: Atraumatic HEENT: No congestion/rhinnorhea. Mucous membranes are moist.  Oropharynx non-erythematous Neck:   Nontender with no meningismus, no masses, no stridor Cardiovascular: Normal rate, regular rhythm. Grossly normal heart sounds.  Good peripheral circulation. Respiratory: Normal respiratory effort.  No retractions. Lungs CTAB. Abdominal: Soft and nontender. No distention. No guarding no rebound Back:  There is no focal tenderness or step off.  there is no midline tenderness there are no lesions noted. there is no CVA tenderness Circumcised genitalia with no swelling pain or masses, no bleeding noted Musculoskeletal: No lower extremity tenderness, no upper extremity tenderness. No joint effusions, no DVT signs strong distal pulses no edema Neurologic:  Normal speech and language. No gross focal neurologic deficits are appreciated.  Skin:  Skin is warm, dry and intact. No rash noted. Psychiatric: Mood and affect are normal. Speech and behavior are normal.  ____________________________________________   LABS (all labs ordered are listed, but only abnormal results are displayed)  Labs Reviewed  URINALYSIS, COMPLETE (UACMP) WITH MICROSCOPIC - Abnormal; Notable for the following components:      Result Value   Color, Urine AMBER (*)    APPearance HAZY (*)    Hgb urine dipstick LARGE (*)    Protein, ur 100 (*)    All other  components within normal limits  BASIC METABOLIC PANEL - Abnormal; Notable for the following components:   Glucose, Bld 144 (*)    BUN 21 (*)    All other components within normal limits  URINE CULTURE  CBC  PROTIME-INR    Pertinent labs  results that were available during my care of the patient were reviewed by me and considered in my medical decision making (see chart for details). ____________________________________________  EKG  I personally interpreted any EKGs ordered by me or triage  ____________________________________________  RADIOLOGY  Pertinent labs & imaging results that were available during my care of the patient were reviewed by me and considered in my medical decision making (see chart for details). If possible, patient and/or family made aware of any abnormal findings. ____________________________________________    PROCEDURES  Procedure(s) performed: None  Procedures  Critical Care performed: None  ____________________________________________   INITIAL IMPRESSION / ASSESSMENT AND PLAN / ED COURSE  Pertinent labs & imaging results that were available during my care of the patient were reviewed by me and considered in my medical decision making (see chart for details).  Patient here with asymptomatic hematuria, urine does not appear to be infected but was setting a urine culture, differential includes a broad spectrum of different pathologies from renal mass to bladder mass to kidney stone etc.  We will obtain CT scan to rule out some of these entities patient will need to follow closely with urology for cystoscopy most likely.  Hemoglobin and blood work otherwise reassuring  ----------------------------------------- 10:15 PM on 09/17/2017 -----------------------------------------  Ct reassuring. Will empirically treat for possible uti pending cx. And d/c.      ____________________________________________   FINAL CLINICAL IMPRESSION(S) / ED  DIAGNOSES  Final diagnoses:  None      This chart was dictated using voice recognition software.  Despite best efforts to proofread,  errors can occur which can change meaning.      Schuyler Amor, MD 09/17/17 2157    Schuyler Amor, MD 09/17/17 2215

## 2017-09-17 NOTE — ED Notes (Signed)
Patient returned from CT

## 2017-09-17 NOTE — ED Notes (Signed)
Registration at bedside.

## 2017-09-17 NOTE — ED Notes (Signed)
RN to bedside for rounding. Patient at CT

## 2017-09-17 NOTE — ED Notes (Signed)
Patient c/o hematuria. Denies pain. Denies changes to urination. Reports hx of kidney stone once, over 5 years ago. Patient reports taking aspirin and plavix daily.

## 2017-09-17 NOTE — ED Triage Notes (Signed)
Pt reports hematuria about 1 hr ago no pain no discomfort pt taking blood thinner pt denies other symptoms at present

## 2017-09-17 NOTE — ED Notes (Signed)
Reviewed d/c instructions, follow-up care, prescription with patient. Patient verbalized understanding 

## 2017-09-19 LAB — URINE CULTURE

## 2017-09-24 ENCOUNTER — Ambulatory Visit: Payer: PPO | Admitting: Urology

## 2017-09-24 ENCOUNTER — Encounter: Payer: Self-pay | Admitting: Urology

## 2017-09-24 VITALS — BP 133/83 | HR 66 | Ht 70.0 in | Wt 173.0 lb

## 2017-09-24 DIAGNOSIS — R310 Gross hematuria: Secondary | ICD-10-CM | POA: Diagnosis not present

## 2017-09-24 NOTE — Progress Notes (Signed)
09/24/2017 2:46 PM   Gregory Humphrey [place] 1941/06/02 [id number]  Referring provider: Kirk Ruths, MD [place] [facility] [place], [place] [address]  Chief Complaint  Patient presents with  . Hematuria    HPI: The patient is a 76 year old gentleman who presents today for ER follow-up after being diagnosed gross painless hematuria.  He underwent a CT stone protocol which showed only nonobstructive 4 mm right renal stone.  He did note small clots.  It is since resolved.  Never had hematuria before.  He has no smoking history.  He passed one stone approximately 15 years ago spontaneously.  He is on Plavix for his recent cardiac stents in January 2018.   PMH: Past Medical History:  Diagnosis Date  . Diabetes mellitus without complication ([place])   . Gilbert's syndrome   . Hypertension   . Kidney stones   . Olecranon bursitis   . Seizures ([place])   . Stroke ([place])   . Subdural hematoma [facility])     Surgical History: Past Surgical History:  Procedure Laterality Date  . CARDIAC CATHETERIZATION N/A 12/05/2016   Procedure: Left Heart Cath and Coronary Angiography;  Surgeon: Teodoro Spray, MD;  Location: [place] CV LAB;  Service: Cardiovascular;  Laterality: N/A;  . CARDIAC CATHETERIZATION N/A 12/05/2016   Procedure: Coronary Stent Intervention;  Surgeon: Wellington Hampshire, MD;  Location: [place] CV LAB;  Service: Cardiovascular;  Laterality: N/A;  . CRANIOTOMY  10/08/2011   Procedure: CRANIOTOMY HEMATOMA EVACUATION SUBDURAL;  Surgeon: Peggyann Shoals, MD;  Location: [place] NEURO ORS;  Service: Neurosurgery;  Laterality: Left;  Left Craniotomy for subdural  . PEG PLACEMENT  10/16/2011   Procedure: PERCUTANEOUS ENDOSCOPIC GASTROSTOMY (PEG) PLACEMENT;  Surgeon: Zenovia Jarred, MD;  Location: [place];  Service: Gastroenterology;  Laterality: N/A;  . REFRACTIVE SURGERY  1995    Home Medications:  Allergies as of 09/24/2017   No Known Allergies       Medication List        Accurate as of 09/24/17  2:46 PM. Always use your most recent med list.          aspirin 81 MG EC tablet Take 1 tablet (81 mg total) by mouth daily.   cephALEXin 500 MG capsule Commonly known as:  KEFLEX Take 1 capsule (500 mg total) 3 (three) times daily for 10 days by mouth.   clopidogrel 75 MG tablet Commonly known as:  PLAVIX Take 1 tablet (75 mg total) by mouth daily with breakfast.   hydrALAZINE 10 MG tablet Commonly known as:  APRESOLINE Take 10 mg by mouth 3 (three) times daily.   lamoTRIgine 25 MG tablet Commonly known as:  LAMICTAL Take 75 mg by mouth daily.   lisinopril 40 MG tablet Commonly known as:  PRINIVIL,ZESTRIL Take 40 mg by mouth daily.   metFORMIN 500 MG tablet Commonly known as:  GLUCOPHAGE Take 500 mg by mouth 2 (two) times daily.   metoprolol succinate 50 MG 24 hr tablet Commonly known as:  TOPROL-XL Take 50 mg by mouth daily.   multivitamin tablet Take 1 tablet by mouth daily.   rosuvastatin 10 MG tablet Commonly known as:  CRESTOR Take 10 mg daily by mouth.   STOOL SOFTENER PO Take 2 capsules by mouth daily as needed.   vitamin C 500 MG tablet Commonly known as:  ASCORBIC ACID Take 500 mg by mouth daily.       Allergies: No Known Allergies  Family History:  Family History  Problem Relation Age of Onset  . Stroke Mother   . Hypertension Mother   . Diabetes Mother   . Cancer Father        bone  . Alcohol abuse Brother        past  . Diabetes Maternal Uncle   . Stroke Maternal Grandfather   . Prostate cancer Neg Hx   . Bladder Cancer Neg Hx   . Kidney cancer Neg Hx     Social History:  reports that  has never smoked. he has never used smokeless tobacco. He reports that he does not drink alcohol or use drugs.  ROS: UROLOGY Frequent Urination?: No Hard to postpone urination?: No Burning/pain with urination?: No Get up at night to urinate?: Yes Leakage of urine?: No Urine stream starts and  stops?: No Trouble starting stream?: No Do you have to strain to urinate?: No Blood in urine?: Yes Urinary tract infection?: No Sexually transmitted disease?: No Injury to kidneys or bladder?: No Painful intercourse?: No Weak stream?: No Erection problems?: No Penile pain?: No  Gastrointestinal Nausea?: No Vomiting?: No Indigestion/heartburn?: No Diarrhea?: Yes Constipation?: No  Constitutional Fever: No Night sweats?: No Weight loss?: No Fatigue?: No  Skin Skin rash/lesions?: No Itching?: No  Eyes Blurred vision?: No Double vision?: No  Ears/Nose/Throat Sore throat?: No Sinus problems?: No  Hematologic/Lymphatic Swollen glands?: No Easy bruising?: Yes  Cardiovascular Leg swelling?: No Chest pain?: No  Respiratory Cough?: No Shortness of breath?: No  Endocrine Excessive thirst?: No  Musculoskeletal Back pain?: Yes Joint pain?: No  Neurological Headaches?: No Dizziness?: No  Psychologic Depression?: No Anxiety?: No  Physical Exam: BP 133/83 (BP Location: Right Arm, Patient Position: Sitting, Cuff Size: Normal)   Pulse 66   Ht 5\' 10"  (1.778 m)   Wt 173 lb (78.5 kg)   BMI 24.82 kg/m   Constitutional:  Alert and oriented, No acute distress. HEENT: [place] AT, moist mucus membranes.  Trachea midline, no masses. Cardiovascular: No clubbing, cyanosis, or edema. Respiratory: Normal respiratory effort, no increased work of breathing. GI: Abdomen is soft, nontender, nondistended, no abdominal masses GU: No CVA tenderness.  Skin: No rashes, bruises or suspicious lesions. Lymph: No cervical or inguinal adenopathy. Neurologic: Grossly intact, no focal deficits, moving all 4 extremities. Psychiatric: Normal mood and affect.  Laboratory Data: Lab Results  Component Value Date   WBC 5.5 09/17/2017   HGB 14.9 09/17/2017   HCT 43.3 09/17/2017   MCV 95.2 09/17/2017   PLT 150 09/17/2017    Lab Results  Component Value Date   CREATININE 0.93  09/17/2017    Lab Results  Component Value Date   PSA 0.95 09/08/2011   PSA 1.00 09/02/2010   PSA 1.12 06/13/2009    No results found for: TESTOSTERONE  Lab Results  Component Value Date   HGBA1C 5.1 12/04/2005    Urinalysis    Component Value Date/Time   COLORURINE AMBER (A) 09/17/2017 1942   APPEARANCEUR HAZY (A) 09/17/2017 1942   APPEARANCEUR Hazy 01/12/2012 1055   LABSPEC 1.013 09/17/2017 1942   LABSPEC 1.010 01/12/2012 1055   PHURINE 6.0 09/17/2017 1942   GLUCOSEU NEGATIVE 09/17/2017 1942   GLUCOSEU Negative 01/12/2012 1055   HGBUR LARGE (A) 09/17/2017 1942   BILIRUBINUR NEGATIVE 09/17/2017 1942   BILIRUBINUR Negative 01/12/2012 [place] 09/17/2017 1942   PROTEINUR 100 (A) 09/17/2017 1942   UROBILINOGEN 2.0 (H) 11/04/2011 2115   NITRITE NEGATIVE 09/17/2017 1942   LEUKOCYTESUR NEGATIVE 09/17/2017  1942   LEUKOCYTESUR Negative 01/12/2012 1055    Pertinent Imaging: CT stone protocol reviewed as above.  Assessment & Plan:    1.  Gross hematuria I discussed the patient that classic hematuria workup which includes a CT urogram followed by office cystoscopy.  He however recently went to the ER within the last week and only a CT without contrast was ordered.  We discussed that this is not the appropriate study because it is not fully characterized the upper tracts.  However, given his recent CT scan, his insurance is not likely to pay for repeat CT.  We will set him undergo a renal ultrasound to look for any renal masses.  A follow-up after that for cystoscopy   Return for after u/s for cysto.  Nickie Retort, MD  [facility] [address], [place], [place] [address] [phone]

## 2017-10-07 DIAGNOSIS — I10 Essential (primary) hypertension: Secondary | ICD-10-CM | POA: Diagnosis not present

## 2017-10-07 DIAGNOSIS — R079 Chest pain, unspecified: Secondary | ICD-10-CM | POA: Diagnosis not present

## 2017-10-07 DIAGNOSIS — I2510 Atherosclerotic heart disease of native coronary artery without angina pectoris: Secondary | ICD-10-CM | POA: Diagnosis not present

## 2017-10-14 ENCOUNTER — Ambulatory Visit
Admission: RE | Admit: 2017-10-14 | Discharge: 2017-10-14 | Disposition: A | Discharge status: Home / Self Care | Payer: PPO | Source: Ambulatory Visit | Attending: Urology | Admitting: Urology

## 2017-10-14 DIAGNOSIS — R310 Gross hematuria: Secondary | ICD-10-CM | POA: Diagnosis not present

## 2017-10-22 ENCOUNTER — Encounter: Payer: Self-pay | Admitting: Urology

## 2017-10-22 ENCOUNTER — Ambulatory Visit: Payer: PPO | Admitting: Urology

## 2017-10-22 VITALS — BP 157/88 | HR 69 | Ht 70.0 in | Wt 178.0 lb

## 2017-10-22 DIAGNOSIS — R310 Gross hematuria: Secondary | ICD-10-CM

## 2017-10-22 MED ORDER — CIPROFLOXACIN HCL 500 MG PO TABS
500.0000 mg | ORAL_TABLET | Freq: Once | ORAL | Status: AC
  Administered 2017-10-22: 500 mg via ORAL

## 2017-10-22 MED ORDER — LIDOCAINE HCL 2 % EX GEL
1.0000 "application " | Freq: Once | CUTANEOUS | Status: AC
  Administered 2017-10-22: 1 via URETHRAL

## 2017-10-22 NOTE — Addendum Note (Signed)
Addended by: Kyra Manges on: 10/22/2017 03:59 PM   Modules accepted: Orders

## 2017-10-22 NOTE — Progress Notes (Signed)
   10/22/17  CC:  Chief Complaint  Patient presents with  . Cysto    HPI:  The patient is a 75 year old gentleman who presents today for ER follow-up after being diagnosed gross painless hematuria.  He underwent a CT stone protocol which showed only nonobstructive 4 mm right renal stone.  He did note small clots.  It is since resolved.  He never had hematuria before.  He has no smoking history.  He passed one stone approximately 15 years ago spontaneously.  He is on Plavix for his recent cardiac stents in January 2018.  He returns after undergoing a renal ultrasound after getting a noncontrasted CT scan in the ER.  This was unremarkable for renal masses.  He presents today for cystoscopy.  There were no vitals taken for this visit. NED. A&Ox3.   No respiratory distress   Abd soft, NT, ND Normal phallus with bilateral descended testicles  Cystoscopy Procedure Note  Patient identification was confirmed, informed consent was obtained, and patient was prepped using Betadine solution.  Lidocaine jelly was administered per urethral meatus.    Preoperative abx where received prior to procedure.     Pre-Procedure: - Inspection reveals a normal caliber ureteral meatus.  Procedure: The flexible cystoscope was introduced without difficulty - No urethral strictures/lesions are present. - Enlarged prostate  very hypervascular prostate.  Visually obstructive approximately 3-4 cm. - Normal bladder neck - Bilateral ureteral orifices identified - Bladder mucosa  reveals no ulcers, tumors, or lesions - No bladder stones - No trabeculation  Retroflexion shows no intravesical lobe   Post-Procedure: - Patient tolerated the procedure well  Assessment/ Plan:  1. Gross hematuria -Likely secondary to hypervascular prostate.  No sign of malignancy.  Follow-up in 1 year for repeat urinalysis.  If it becomes more frequent, we can start finasteride.

## 2017-11-12 DIAGNOSIS — E113393 Type 2 diabetes mellitus with moderate nonproliferative diabetic retinopathy without macular edema, bilateral: Secondary | ICD-10-CM | POA: Diagnosis not present

## 2017-11-26 DIAGNOSIS — R079 Chest pain, unspecified: Secondary | ICD-10-CM | POA: Diagnosis not present

## 2018-02-23 DIAGNOSIS — D2272 Melanocytic nevi of left lower limb, including hip: Secondary | ICD-10-CM | POA: Diagnosis not present

## 2018-02-23 DIAGNOSIS — D2261 Melanocytic nevi of right upper limb, including shoulder: Secondary | ICD-10-CM | POA: Diagnosis not present

## 2018-02-23 DIAGNOSIS — D225 Melanocytic nevi of trunk: Secondary | ICD-10-CM | POA: Diagnosis not present

## 2018-02-23 DIAGNOSIS — Z85828 Personal history of other malignant neoplasm of skin: Secondary | ICD-10-CM | POA: Diagnosis not present

## 2018-02-23 DIAGNOSIS — Z08 Encounter for follow-up examination after completed treatment for malignant neoplasm: Secondary | ICD-10-CM | POA: Diagnosis not present

## 2018-02-23 DIAGNOSIS — D2271 Melanocytic nevi of right lower limb, including hip: Secondary | ICD-10-CM | POA: Diagnosis not present

## 2018-02-23 DIAGNOSIS — X32XXXA Exposure to sunlight, initial encounter: Secondary | ICD-10-CM | POA: Diagnosis not present

## 2018-02-23 DIAGNOSIS — L570 Actinic keratosis: Secondary | ICD-10-CM | POA: Diagnosis not present

## 2018-02-23 DIAGNOSIS — Z85820 Personal history of malignant melanoma of skin: Secondary | ICD-10-CM | POA: Diagnosis not present

## 2018-02-23 DIAGNOSIS — D2262 Melanocytic nevi of left upper limb, including shoulder: Secondary | ICD-10-CM | POA: Diagnosis not present

## 2018-02-23 DIAGNOSIS — L821 Other seborrheic keratosis: Secondary | ICD-10-CM | POA: Diagnosis not present

## 2018-03-09 DIAGNOSIS — I10 Essential (primary) hypertension: Secondary | ICD-10-CM | POA: Diagnosis not present

## 2018-03-09 DIAGNOSIS — I2510 Atherosclerotic heart disease of native coronary artery without angina pectoris: Secondary | ICD-10-CM | POA: Diagnosis not present

## 2018-03-09 DIAGNOSIS — N182 Chronic kidney disease, stage 2 (mild): Secondary | ICD-10-CM | POA: Diagnosis not present

## 2018-03-09 DIAGNOSIS — E1122 Type 2 diabetes mellitus with diabetic chronic kidney disease: Secondary | ICD-10-CM | POA: Diagnosis not present

## 2018-03-16 DIAGNOSIS — I69319 Unspecified symptoms and signs involving cognitive functions following cerebral infarction: Secondary | ICD-10-CM | POA: Diagnosis not present

## 2018-03-16 DIAGNOSIS — I10 Essential (primary) hypertension: Secondary | ICD-10-CM | POA: Diagnosis not present

## 2018-03-16 DIAGNOSIS — G40909 Epilepsy, unspecified, not intractable, without status epilepticus: Secondary | ICD-10-CM | POA: Diagnosis not present

## 2018-03-16 DIAGNOSIS — E1122 Type 2 diabetes mellitus with diabetic chronic kidney disease: Secondary | ICD-10-CM | POA: Diagnosis not present

## 2018-03-16 DIAGNOSIS — N182 Chronic kidney disease, stage 2 (mild): Secondary | ICD-10-CM | POA: Diagnosis not present

## 2018-03-16 DIAGNOSIS — I2510 Atherosclerotic heart disease of native coronary artery without angina pectoris: Secondary | ICD-10-CM | POA: Diagnosis not present

## 2018-03-16 DIAGNOSIS — Z0000 Encounter for general adult medical examination without abnormal findings: Secondary | ICD-10-CM | POA: Diagnosis not present

## 2018-04-21 DIAGNOSIS — J069 Acute upper respiratory infection, unspecified: Secondary | ICD-10-CM | POA: Diagnosis not present

## 2018-05-11 DIAGNOSIS — E113393 Type 2 diabetes mellitus with moderate nonproliferative diabetic retinopathy without macular edema, bilateral: Secondary | ICD-10-CM | POA: Diagnosis not present

## 2018-07-15 DIAGNOSIS — I10 Essential (primary) hypertension: Secondary | ICD-10-CM | POA: Diagnosis not present

## 2018-07-15 DIAGNOSIS — E1122 Type 2 diabetes mellitus with diabetic chronic kidney disease: Secondary | ICD-10-CM | POA: Diagnosis not present

## 2018-07-15 DIAGNOSIS — I2510 Atherosclerotic heart disease of native coronary artery without angina pectoris: Secondary | ICD-10-CM | POA: Diagnosis not present

## 2018-07-15 DIAGNOSIS — N182 Chronic kidney disease, stage 2 (mild): Secondary | ICD-10-CM | POA: Diagnosis not present

## 2018-09-09 DIAGNOSIS — E1122 Type 2 diabetes mellitus with diabetic chronic kidney disease: Secondary | ICD-10-CM | POA: Diagnosis not present

## 2018-09-09 DIAGNOSIS — I2510 Atherosclerotic heart disease of native coronary artery without angina pectoris: Secondary | ICD-10-CM | POA: Diagnosis not present

## 2018-09-09 DIAGNOSIS — N182 Chronic kidney disease, stage 2 (mild): Secondary | ICD-10-CM | POA: Diagnosis not present

## 2018-09-16 DIAGNOSIS — I69319 Unspecified symptoms and signs involving cognitive functions following cerebral infarction: Secondary | ICD-10-CM | POA: Diagnosis not present

## 2018-09-16 DIAGNOSIS — G40909 Epilepsy, unspecified, not intractable, without status epilepticus: Secondary | ICD-10-CM | POA: Diagnosis not present

## 2018-09-16 DIAGNOSIS — I10 Essential (primary) hypertension: Secondary | ICD-10-CM | POA: Diagnosis not present

## 2018-09-16 DIAGNOSIS — E1122 Type 2 diabetes mellitus with diabetic chronic kidney disease: Secondary | ICD-10-CM | POA: Diagnosis not present

## 2018-09-16 DIAGNOSIS — I2510 Atherosclerotic heart disease of native coronary artery without angina pectoris: Secondary | ICD-10-CM | POA: Diagnosis not present

## 2018-09-16 DIAGNOSIS — N182 Chronic kidney disease, stage 2 (mild): Secondary | ICD-10-CM | POA: Diagnosis not present

## 2018-11-11 DIAGNOSIS — I2510 Atherosclerotic heart disease of native coronary artery without angina pectoris: Secondary | ICD-10-CM | POA: Diagnosis not present

## 2018-11-11 DIAGNOSIS — N182 Chronic kidney disease, stage 2 (mild): Secondary | ICD-10-CM | POA: Diagnosis not present

## 2018-11-11 DIAGNOSIS — G250 Essential tremor: Secondary | ICD-10-CM | POA: Diagnosis not present

## 2018-11-11 DIAGNOSIS — E1122 Type 2 diabetes mellitus with diabetic chronic kidney disease: Secondary | ICD-10-CM | POA: Diagnosis not present

## 2018-11-16 DIAGNOSIS — E113393 Type 2 diabetes mellitus with moderate nonproliferative diabetic retinopathy without macular edema, bilateral: Secondary | ICD-10-CM | POA: Diagnosis not present

## 2018-11-23 DIAGNOSIS — I2510 Atherosclerotic heart disease of native coronary artery without angina pectoris: Secondary | ICD-10-CM | POA: Diagnosis not present

## 2018-11-29 DIAGNOSIS — I69319 Unspecified symptoms and signs involving cognitive functions following cerebral infarction: Secondary | ICD-10-CM | POA: Diagnosis not present

## 2018-11-29 DIAGNOSIS — I10 Essential (primary) hypertension: Secondary | ICD-10-CM | POA: Diagnosis not present

## 2018-11-29 DIAGNOSIS — E1122 Type 2 diabetes mellitus with diabetic chronic kidney disease: Secondary | ICD-10-CM | POA: Diagnosis not present

## 2018-11-29 DIAGNOSIS — N182 Chronic kidney disease, stage 2 (mild): Secondary | ICD-10-CM | POA: Diagnosis not present

## 2018-11-29 DIAGNOSIS — I2510 Atherosclerotic heart disease of native coronary artery without angina pectoris: Secondary | ICD-10-CM | POA: Diagnosis not present

## 2019-02-16 ENCOUNTER — Other Ambulatory Visit: Payer: Self-pay | Admitting: *Deleted

## 2019-02-16 NOTE — Patient Outreach (Signed)
[place] [facility]) Care Management  02/16/2019  Gregory Humphrey 13-Nov-1940 [id number]   Nurse call center  Referral Date: 02/14/19 Referral Source:  Nurse call center Referral Reason: 02/15/19 Drucie Opitz called stating Gregory Humphrey has a rash, red splotches on hs back, shoulders and lower legs that are not raised but itching, no other s/s pmh DM, htn - Rodman Pickle RN referred to PCP office in 24 hours but provided s/s tx benadryl, hydrocortisone, call back if purple or blood colored or blister or fever  Insurance: HTA    Outreach attempt # 1  Patient is able to verify HIPAA He gave permission to speak with his wife Reviewed and addressed referral to [facility] with patient   Mrs Alligood reports attempts to reach primary MD unsuccessfully but was able to obtain benadryl that was given on 02/15/19 and on 02/16/19 am hydrocortisone ws applied  To his back  Denies increased s/s, increased itching, no fever, no blistering of rash, no swelling, no sob, no n/v/d and no spreading of the rash to another location They deny possible contact with vegetation during walks in the neighborhood, an insect bite, exposure to heat other than a hot shower or any change in medicines or self care products They report they are primary staying in the home related to coronavirus pandemic.  Gregory Kashuba is scheduled to be seen by a dermatologist on 02/24/19 Mrs Himes was encouraged to call earlier if s/s change and to call to see if a possible earlier telehealth call can be received by asking to speak with dermatologist's RN  Mrs Whittlesey voiced understanding   Social: Gregory Chizek lives with his wife who provides good support He denies concerns with transportation to medical appointments or completing his care needs    Conditions: HTN, NSTEMI, hx PNA, ICH, gilbert's syndrome, hx of renal calculus, hyperglycemia, chronic respiratory failure left hemiparesis, carbohydrate metabolism disorder HLD, DM  DME:  none  Medications: denies concerns with taking medications as prescribed, affording medications, side effects of medications and questions about medications    Appointments: 03/01/19 Dr Ubaldo Glassing Cardiology 03/15/19 labs prior to see Dr Ouida Sills, primary MD on 03/22/19   Advance Directives: Denies need for assist with advance directives   Consent: [facility] RN CM reviewed [facility] services with patient. Patient gave verbal consent for services [facility] telephonic RN CM.   Plan: [facility] RN CM will close case at this time as patient has been assessed and no needs identified/needs resolved.   Pt encouraged to return a call to [place] CM prn  [facility] RN CM sent a successful outreach letter as discussed with [facility] brochure enclosed for review.   Kimberly L. Lavina Hamman, RN, BSN, [place] Coordinator Office number [phone] Mobile number [phone]  Main [facility] number [phone] Fax number [phone]

## 2019-02-22 DIAGNOSIS — L309 Dermatitis, unspecified: Secondary | ICD-10-CM | POA: Diagnosis not present

## 2019-02-25 DIAGNOSIS — E1122 Type 2 diabetes mellitus with diabetic chronic kidney disease: Secondary | ICD-10-CM | POA: Diagnosis not present

## 2019-02-25 DIAGNOSIS — I2510 Atherosclerotic heart disease of native coronary artery without angina pectoris: Secondary | ICD-10-CM | POA: Diagnosis not present

## 2019-02-25 DIAGNOSIS — I10 Essential (primary) hypertension: Secondary | ICD-10-CM | POA: Diagnosis not present

## 2019-02-25 DIAGNOSIS — N182 Chronic kidney disease, stage 2 (mild): Secondary | ICD-10-CM | POA: Diagnosis not present

## 2019-03-01 DIAGNOSIS — I10 Essential (primary) hypertension: Secondary | ICD-10-CM | POA: Diagnosis not present

## 2019-03-01 DIAGNOSIS — I2510 Atherosclerotic heart disease of native coronary artery without angina pectoris: Secondary | ICD-10-CM | POA: Diagnosis not present

## 2019-03-01 DIAGNOSIS — I69319 Unspecified symptoms and signs involving cognitive functions following cerebral infarction: Secondary | ICD-10-CM | POA: Diagnosis not present

## 2019-03-01 DIAGNOSIS — E1122 Type 2 diabetes mellitus with diabetic chronic kidney disease: Secondary | ICD-10-CM | POA: Diagnosis not present

## 2019-03-01 DIAGNOSIS — N182 Chronic kidney disease, stage 2 (mild): Secondary | ICD-10-CM | POA: Diagnosis not present

## 2019-03-04 DIAGNOSIS — I10 Essential (primary) hypertension: Secondary | ICD-10-CM | POA: Diagnosis not present

## 2019-03-15 DIAGNOSIS — N182 Chronic kidney disease, stage 2 (mild): Secondary | ICD-10-CM | POA: Diagnosis not present

## 2019-03-15 DIAGNOSIS — E1122 Type 2 diabetes mellitus with diabetic chronic kidney disease: Secondary | ICD-10-CM | POA: Diagnosis not present

## 2019-03-15 DIAGNOSIS — I10 Essential (primary) hypertension: Secondary | ICD-10-CM | POA: Diagnosis not present

## 2019-03-15 DIAGNOSIS — I2510 Atherosclerotic heart disease of native coronary artery without angina pectoris: Secondary | ICD-10-CM | POA: Diagnosis not present

## 2019-03-22 DIAGNOSIS — Z0000 Encounter for general adult medical examination without abnormal findings: Secondary | ICD-10-CM | POA: Diagnosis not present

## 2019-03-22 DIAGNOSIS — G40909 Epilepsy, unspecified, not intractable, without status epilepticus: Secondary | ICD-10-CM | POA: Diagnosis not present

## 2019-03-22 DIAGNOSIS — I10 Essential (primary) hypertension: Secondary | ICD-10-CM | POA: Diagnosis not present

## 2019-03-22 DIAGNOSIS — I2510 Atherosclerotic heart disease of native coronary artery without angina pectoris: Secondary | ICD-10-CM | POA: Diagnosis not present

## 2019-03-22 DIAGNOSIS — E1122 Type 2 diabetes mellitus with diabetic chronic kidney disease: Secondary | ICD-10-CM | POA: Diagnosis not present

## 2019-03-22 DIAGNOSIS — N182 Chronic kidney disease, stage 2 (mild): Secondary | ICD-10-CM | POA: Diagnosis not present

## 2019-05-26 DIAGNOSIS — E113393 Type 2 diabetes mellitus with moderate nonproliferative diabetic retinopathy without macular edema, bilateral: Secondary | ICD-10-CM | POA: Diagnosis not present

## 2019-05-30 DIAGNOSIS — Z85820 Personal history of malignant melanoma of skin: Secondary | ICD-10-CM | POA: Diagnosis not present

## 2019-05-30 DIAGNOSIS — L538 Other specified erythematous conditions: Secondary | ICD-10-CM | POA: Diagnosis not present

## 2019-05-30 DIAGNOSIS — Z85828 Personal history of other malignant neoplasm of skin: Secondary | ICD-10-CM | POA: Diagnosis not present

## 2019-05-30 DIAGNOSIS — L820 Inflamed seborrheic keratosis: Secondary | ICD-10-CM | POA: Diagnosis not present

## 2019-05-30 DIAGNOSIS — L300 Nummular dermatitis: Secondary | ICD-10-CM | POA: Diagnosis not present

## 2019-05-30 DIAGNOSIS — D2272 Melanocytic nevi of left lower limb, including hip: Secondary | ICD-10-CM | POA: Diagnosis not present

## 2019-05-30 DIAGNOSIS — L570 Actinic keratosis: Secondary | ICD-10-CM | POA: Diagnosis not present

## 2019-05-30 DIAGNOSIS — D2262 Melanocytic nevi of left upper limb, including shoulder: Secondary | ICD-10-CM | POA: Diagnosis not present

## 2019-05-30 DIAGNOSIS — L298 Other pruritus: Secondary | ICD-10-CM | POA: Diagnosis not present

## 2019-08-31 DIAGNOSIS — I2510 Atherosclerotic heart disease of native coronary artery without angina pectoris: Secondary | ICD-10-CM | POA: Diagnosis not present

## 2019-08-31 DIAGNOSIS — I69319 Unspecified symptoms and signs involving cognitive functions following cerebral infarction: Secondary | ICD-10-CM | POA: Diagnosis not present

## 2019-08-31 DIAGNOSIS — E1122 Type 2 diabetes mellitus with diabetic chronic kidney disease: Secondary | ICD-10-CM | POA: Diagnosis not present

## 2019-08-31 DIAGNOSIS — N182 Chronic kidney disease, stage 2 (mild): Secondary | ICD-10-CM | POA: Diagnosis not present

## 2019-08-31 DIAGNOSIS — I10 Essential (primary) hypertension: Secondary | ICD-10-CM | POA: Diagnosis not present

## 2019-09-15 DIAGNOSIS — N182 Chronic kidney disease, stage 2 (mild): Secondary | ICD-10-CM | POA: Diagnosis not present

## 2019-09-15 DIAGNOSIS — E1122 Type 2 diabetes mellitus with diabetic chronic kidney disease: Secondary | ICD-10-CM | POA: Diagnosis not present

## 2019-09-15 DIAGNOSIS — I10 Essential (primary) hypertension: Secondary | ICD-10-CM | POA: Diagnosis not present

## 2019-09-15 DIAGNOSIS — I2510 Atherosclerotic heart disease of native coronary artery without angina pectoris: Secondary | ICD-10-CM | POA: Diagnosis not present

## 2019-09-22 DIAGNOSIS — G40909 Epilepsy, unspecified, not intractable, without status epilepticus: Secondary | ICD-10-CM | POA: Diagnosis not present

## 2019-09-22 DIAGNOSIS — E1122 Type 2 diabetes mellitus with diabetic chronic kidney disease: Secondary | ICD-10-CM | POA: Diagnosis not present

## 2019-09-22 DIAGNOSIS — N182 Chronic kidney disease, stage 2 (mild): Secondary | ICD-10-CM | POA: Diagnosis not present

## 2019-09-22 DIAGNOSIS — I2510 Atherosclerotic heart disease of native coronary artery without angina pectoris: Secondary | ICD-10-CM | POA: Diagnosis not present

## 2019-09-22 DIAGNOSIS — I10 Essential (primary) hypertension: Secondary | ICD-10-CM | POA: Diagnosis not present

## 2019-09-22 DIAGNOSIS — I69319 Unspecified symptoms and signs involving cognitive functions following cerebral infarction: Secondary | ICD-10-CM | POA: Diagnosis not present

## 2019-11-28 DIAGNOSIS — E113393 Type 2 diabetes mellitus with moderate nonproliferative diabetic retinopathy without macular edema, bilateral: Secondary | ICD-10-CM | POA: Diagnosis not present

## 2020-01-20 DIAGNOSIS — Z23 Encounter for immunization: Secondary | ICD-10-CM | POA: Diagnosis not present

## 2020-02-28 DIAGNOSIS — I10 Essential (primary) hypertension: Secondary | ICD-10-CM | POA: Diagnosis not present

## 2020-02-28 DIAGNOSIS — N182 Chronic kidney disease, stage 2 (mild): Secondary | ICD-10-CM | POA: Diagnosis not present

## 2020-02-28 DIAGNOSIS — E1122 Type 2 diabetes mellitus with diabetic chronic kidney disease: Secondary | ICD-10-CM | POA: Diagnosis not present

## 2020-02-28 DIAGNOSIS — I2510 Atherosclerotic heart disease of native coronary artery without angina pectoris: Secondary | ICD-10-CM | POA: Diagnosis not present

## 2020-03-15 DIAGNOSIS — I10 Essential (primary) hypertension: Secondary | ICD-10-CM | POA: Diagnosis not present

## 2020-03-15 DIAGNOSIS — N182 Chronic kidney disease, stage 2 (mild): Secondary | ICD-10-CM | POA: Diagnosis not present

## 2020-03-15 DIAGNOSIS — I2510 Atherosclerotic heart disease of native coronary artery without angina pectoris: Secondary | ICD-10-CM | POA: Diagnosis not present

## 2020-03-15 DIAGNOSIS — E1122 Type 2 diabetes mellitus with diabetic chronic kidney disease: Secondary | ICD-10-CM | POA: Diagnosis not present

## 2020-03-22 DIAGNOSIS — I69319 Unspecified symptoms and signs involving cognitive functions following cerebral infarction: Secondary | ICD-10-CM | POA: Diagnosis not present

## 2020-03-22 DIAGNOSIS — E1122 Type 2 diabetes mellitus with diabetic chronic kidney disease: Secondary | ICD-10-CM | POA: Diagnosis not present

## 2020-03-22 DIAGNOSIS — N182 Chronic kidney disease, stage 2 (mild): Secondary | ICD-10-CM | POA: Diagnosis not present

## 2020-03-22 DIAGNOSIS — I2510 Atherosclerotic heart disease of native coronary artery without angina pectoris: Secondary | ICD-10-CM | POA: Diagnosis not present

## 2020-03-22 DIAGNOSIS — Z0000 Encounter for general adult medical examination without abnormal findings: Secondary | ICD-10-CM | POA: Diagnosis not present

## 2020-03-22 DIAGNOSIS — I10 Essential (primary) hypertension: Secondary | ICD-10-CM | POA: Diagnosis not present

## 2020-03-22 DIAGNOSIS — G40909 Epilepsy, unspecified, not intractable, without status epilepticus: Secondary | ICD-10-CM | POA: Diagnosis not present

## 2020-05-29 DIAGNOSIS — D2262 Melanocytic nevi of left upper limb, including shoulder: Secondary | ICD-10-CM | POA: Diagnosis not present

## 2020-05-29 DIAGNOSIS — D2271 Melanocytic nevi of right lower limb, including hip: Secondary | ICD-10-CM | POA: Diagnosis not present

## 2020-05-29 DIAGNOSIS — D225 Melanocytic nevi of trunk: Secondary | ICD-10-CM | POA: Diagnosis not present

## 2020-05-29 DIAGNOSIS — L570 Actinic keratosis: Secondary | ICD-10-CM | POA: Diagnosis not present

## 2020-05-29 DIAGNOSIS — L821 Other seborrheic keratosis: Secondary | ICD-10-CM | POA: Diagnosis not present

## 2020-05-29 DIAGNOSIS — Z85820 Personal history of malignant melanoma of skin: Secondary | ICD-10-CM | POA: Diagnosis not present

## 2020-05-29 DIAGNOSIS — X32XXXA Exposure to sunlight, initial encounter: Secondary | ICD-10-CM | POA: Diagnosis not present

## 2020-05-29 DIAGNOSIS — Z85828 Personal history of other malignant neoplasm of skin: Secondary | ICD-10-CM | POA: Diagnosis not present

## 2020-05-31 DIAGNOSIS — E113393 Type 2 diabetes mellitus with moderate nonproliferative diabetic retinopathy without macular edema, bilateral: Secondary | ICD-10-CM | POA: Diagnosis not present

## 2020-07-12 DIAGNOSIS — E113393 Type 2 diabetes mellitus with moderate nonproliferative diabetic retinopathy without macular edema, bilateral: Secondary | ICD-10-CM | POA: Diagnosis not present

## 2020-09-05 DIAGNOSIS — E1122 Type 2 diabetes mellitus with diabetic chronic kidney disease: Secondary | ICD-10-CM | POA: Diagnosis not present

## 2020-09-05 DIAGNOSIS — I2510 Atherosclerotic heart disease of native coronary artery without angina pectoris: Secondary | ICD-10-CM | POA: Diagnosis not present

## 2020-09-05 DIAGNOSIS — I10 Essential (primary) hypertension: Secondary | ICD-10-CM | POA: Diagnosis not present

## 2020-09-05 DIAGNOSIS — G250 Essential tremor: Secondary | ICD-10-CM | POA: Diagnosis not present

## 2020-09-05 DIAGNOSIS — N182 Chronic kidney disease, stage 2 (mild): Secondary | ICD-10-CM | POA: Diagnosis not present

## 2020-09-05 DIAGNOSIS — I69319 Unspecified symptoms and signs involving cognitive functions following cerebral infarction: Secondary | ICD-10-CM | POA: Diagnosis not present

## 2020-09-05 DIAGNOSIS — R0602 Shortness of breath: Secondary | ICD-10-CM | POA: Diagnosis not present

## 2020-09-19 DIAGNOSIS — R0602 Shortness of breath: Secondary | ICD-10-CM | POA: Diagnosis not present

## 2020-09-20 DIAGNOSIS — I10 Essential (primary) hypertension: Secondary | ICD-10-CM | POA: Diagnosis not present

## 2020-09-20 DIAGNOSIS — N182 Chronic kidney disease, stage 2 (mild): Secondary | ICD-10-CM | POA: Diagnosis not present

## 2020-09-20 DIAGNOSIS — E1122 Type 2 diabetes mellitus with diabetic chronic kidney disease: Secondary | ICD-10-CM | POA: Diagnosis not present

## 2020-09-27 DIAGNOSIS — I10 Essential (primary) hypertension: Secondary | ICD-10-CM | POA: Diagnosis not present

## 2020-09-27 DIAGNOSIS — E1122 Type 2 diabetes mellitus with diabetic chronic kidney disease: Secondary | ICD-10-CM | POA: Diagnosis not present

## 2020-09-27 DIAGNOSIS — G40909 Epilepsy, unspecified, not intractable, without status epilepticus: Secondary | ICD-10-CM | POA: Diagnosis not present

## 2020-09-27 DIAGNOSIS — N182 Chronic kidney disease, stage 2 (mild): Secondary | ICD-10-CM | POA: Diagnosis not present

## 2020-09-27 DIAGNOSIS — I2510 Atherosclerotic heart disease of native coronary artery without angina pectoris: Secondary | ICD-10-CM | POA: Diagnosis not present

## 2020-09-27 DIAGNOSIS — I69319 Unspecified symptoms and signs involving cognitive functions following cerebral infarction: Secondary | ICD-10-CM | POA: Diagnosis not present

## 2020-10-31 DIAGNOSIS — K30 Functional dyspepsia: Secondary | ICD-10-CM | POA: Diagnosis not present

## 2020-10-31 DIAGNOSIS — M25511 Pain in right shoulder: Secondary | ICD-10-CM | POA: Diagnosis not present

## 2020-10-31 DIAGNOSIS — L282 Other prurigo: Secondary | ICD-10-CM | POA: Diagnosis not present

## 2020-11-01 DIAGNOSIS — R21 Rash and other nonspecific skin eruption: Secondary | ICD-10-CM | POA: Diagnosis not present

## 2020-11-29 DIAGNOSIS — E113393 Type 2 diabetes mellitus with moderate nonproliferative diabetic retinopathy without macular edema, bilateral: Secondary | ICD-10-CM | POA: Diagnosis not present

## 2020-12-11 DIAGNOSIS — H2512 Age-related nuclear cataract, left eye: Secondary | ICD-10-CM | POA: Diagnosis not present

## 2020-12-11 DIAGNOSIS — H18413 Arcus senilis, bilateral: Secondary | ICD-10-CM | POA: Diagnosis not present

## 2020-12-11 DIAGNOSIS — E119 Type 2 diabetes mellitus without complications: Secondary | ICD-10-CM | POA: Diagnosis not present

## 2020-12-11 DIAGNOSIS — H2513 Age-related nuclear cataract, bilateral: Secondary | ICD-10-CM | POA: Diagnosis not present

## 2020-12-11 DIAGNOSIS — H25043 Posterior subcapsular polar age-related cataract, bilateral: Secondary | ICD-10-CM | POA: Diagnosis not present

## 2020-12-11 DIAGNOSIS — H25013 Cortical age-related cataract, bilateral: Secondary | ICD-10-CM | POA: Diagnosis not present

## 2020-12-24 DIAGNOSIS — H2512 Age-related nuclear cataract, left eye: Secondary | ICD-10-CM | POA: Diagnosis not present

## 2020-12-25 DIAGNOSIS — H2511 Age-related nuclear cataract, right eye: Secondary | ICD-10-CM | POA: Diagnosis not present

## 2021-01-14 DIAGNOSIS — H2511 Age-related nuclear cataract, right eye: Secondary | ICD-10-CM | POA: Diagnosis not present

## 2021-01-24 DIAGNOSIS — I10 Essential (primary) hypertension: Secondary | ICD-10-CM | POA: Diagnosis not present

## 2021-01-24 DIAGNOSIS — I2510 Atherosclerotic heart disease of native coronary artery without angina pectoris: Secondary | ICD-10-CM | POA: Diagnosis not present

## 2021-01-24 DIAGNOSIS — E1122 Type 2 diabetes mellitus with diabetic chronic kidney disease: Secondary | ICD-10-CM | POA: Diagnosis not present

## 2021-01-24 DIAGNOSIS — N182 Chronic kidney disease, stage 2 (mild): Secondary | ICD-10-CM | POA: Diagnosis not present

## 2021-01-31 DIAGNOSIS — E7800 Pure hypercholesterolemia, unspecified: Secondary | ICD-10-CM | POA: Diagnosis not present

## 2021-01-31 DIAGNOSIS — N182 Chronic kidney disease, stage 2 (mild): Secondary | ICD-10-CM | POA: Diagnosis not present

## 2021-01-31 DIAGNOSIS — I10 Essential (primary) hypertension: Secondary | ICD-10-CM | POA: Diagnosis not present

## 2021-01-31 DIAGNOSIS — E1122 Type 2 diabetes mellitus with diabetic chronic kidney disease: Secondary | ICD-10-CM | POA: Diagnosis not present

## 2021-01-31 DIAGNOSIS — I2510 Atherosclerotic heart disease of native coronary artery without angina pectoris: Secondary | ICD-10-CM | POA: Diagnosis not present

## 2021-01-31 DIAGNOSIS — G40909 Epilepsy, unspecified, not intractable, without status epilepticus: Secondary | ICD-10-CM | POA: Diagnosis not present

## 2021-02-04 DIAGNOSIS — Z961 Presence of intraocular lens: Secondary | ICD-10-CM | POA: Diagnosis not present

## 2021-02-18 DIAGNOSIS — R079 Chest pain, unspecified: Secondary | ICD-10-CM | POA: Diagnosis not present

## 2021-02-18 DIAGNOSIS — I10 Essential (primary) hypertension: Secondary | ICD-10-CM | POA: Diagnosis not present

## 2021-02-18 DIAGNOSIS — I2510 Atherosclerotic heart disease of native coronary artery without angina pectoris: Secondary | ICD-10-CM | POA: Diagnosis not present

## 2021-03-14 DIAGNOSIS — R079 Chest pain, unspecified: Secondary | ICD-10-CM | POA: Diagnosis not present

## 2021-03-25 DIAGNOSIS — I2510 Atherosclerotic heart disease of native coronary artery without angina pectoris: Secondary | ICD-10-CM | POA: Diagnosis not present

## 2021-03-25 DIAGNOSIS — E1122 Type 2 diabetes mellitus with diabetic chronic kidney disease: Secondary | ICD-10-CM | POA: Diagnosis not present

## 2021-03-25 DIAGNOSIS — I10 Essential (primary) hypertension: Secondary | ICD-10-CM | POA: Diagnosis not present

## 2021-03-25 DIAGNOSIS — E7800 Pure hypercholesterolemia, unspecified: Secondary | ICD-10-CM | POA: Diagnosis not present

## 2021-03-25 DIAGNOSIS — N182 Chronic kidney disease, stage 2 (mild): Secondary | ICD-10-CM | POA: Diagnosis not present

## 2021-04-11 DIAGNOSIS — E113393 Type 2 diabetes mellitus with moderate nonproliferative diabetic retinopathy without macular edema, bilateral: Secondary | ICD-10-CM | POA: Diagnosis not present

## 2021-05-29 DIAGNOSIS — Z86006 Personal history of melanoma in-situ: Secondary | ICD-10-CM | POA: Diagnosis not present

## 2021-05-29 DIAGNOSIS — Z85820 Personal history of malignant melanoma of skin: Secondary | ICD-10-CM | POA: Diagnosis not present

## 2021-05-29 DIAGNOSIS — D225 Melanocytic nevi of trunk: Secondary | ICD-10-CM | POA: Diagnosis not present

## 2021-05-29 DIAGNOSIS — L820 Inflamed seborrheic keratosis: Secondary | ICD-10-CM | POA: Diagnosis not present

## 2021-05-29 DIAGNOSIS — L570 Actinic keratosis: Secondary | ICD-10-CM | POA: Diagnosis not present

## 2021-05-29 DIAGNOSIS — D2272 Melanocytic nevi of left lower limb, including hip: Secondary | ICD-10-CM | POA: Diagnosis not present

## 2021-05-29 DIAGNOSIS — X32XXXA Exposure to sunlight, initial encounter: Secondary | ICD-10-CM | POA: Diagnosis not present

## 2021-05-29 DIAGNOSIS — L538 Other specified erythematous conditions: Secondary | ICD-10-CM | POA: Diagnosis not present

## 2021-05-29 DIAGNOSIS — Z85828 Personal history of other malignant neoplasm of skin: Secondary | ICD-10-CM | POA: Diagnosis not present

## 2021-07-02 DIAGNOSIS — E7800 Pure hypercholesterolemia, unspecified: Secondary | ICD-10-CM | POA: Diagnosis not present

## 2021-07-02 DIAGNOSIS — I10 Essential (primary) hypertension: Secondary | ICD-10-CM | POA: Diagnosis not present

## 2021-07-02 DIAGNOSIS — E1122 Type 2 diabetes mellitus with diabetic chronic kidney disease: Secondary | ICD-10-CM | POA: Diagnosis not present

## 2021-07-02 DIAGNOSIS — N182 Chronic kidney disease, stage 2 (mild): Secondary | ICD-10-CM | POA: Diagnosis not present

## 2021-07-09 DIAGNOSIS — N1831 Chronic kidney disease, stage 3a: Secondary | ICD-10-CM | POA: Diagnosis not present

## 2021-07-09 DIAGNOSIS — G40909 Epilepsy, unspecified, not intractable, without status epilepticus: Secondary | ICD-10-CM | POA: Diagnosis not present

## 2021-07-09 DIAGNOSIS — E1122 Type 2 diabetes mellitus with diabetic chronic kidney disease: Secondary | ICD-10-CM | POA: Diagnosis not present

## 2021-07-09 DIAGNOSIS — Z0000 Encounter for general adult medical examination without abnormal findings: Secondary | ICD-10-CM | POA: Diagnosis not present

## 2021-07-09 DIAGNOSIS — I69319 Unspecified symptoms and signs involving cognitive functions following cerebral infarction: Secondary | ICD-10-CM | POA: Diagnosis not present

## 2021-07-09 DIAGNOSIS — I2510 Atherosclerotic heart disease of native coronary artery without angina pectoris: Secondary | ICD-10-CM | POA: Diagnosis not present

## 2021-08-09 ENCOUNTER — Other Ambulatory Visit: Payer: Self-pay

## 2021-08-09 ENCOUNTER — Ambulatory Visit
Admission: EM | Admit: 2021-08-09 | Discharge: 2021-08-09 | Disposition: A | Discharge status: Home / Self Care | Payer: PPO

## 2021-08-09 ENCOUNTER — Encounter: Payer: Self-pay | Admitting: Emergency Medicine

## 2021-08-09 DIAGNOSIS — J069 Acute upper respiratory infection, unspecified: Secondary | ICD-10-CM | POA: Diagnosis not present

## 2021-08-09 DIAGNOSIS — Z1152 Encounter for screening for COVID-19: Secondary | ICD-10-CM | POA: Diagnosis not present

## 2021-08-09 MED ORDER — FLUTICASONE PROPIONATE 50 MCG/ACT NA SUSP: 2.0000 | Freq: Every day | NASAL | 0 refills | Status: DC

## 2021-08-09 MED ORDER — BENZONATATE 100 MG PO CAPS: 100.0000 mg | ORAL_CAPSULE | Freq: Three times a day (TID) | ORAL | 0 refills | Status: DC

## 2021-08-09 NOTE — ED Triage Notes (Signed)
Pt here with cough, sore throat and nasal congestion x 2 days.

## 2021-08-09 NOTE — ED Provider Notes (Signed)
CHIEF COMPLAINT:   Chief Complaint  Patient presents with   Cough   Nasal Congestion   Sore Throat      SUBJECTIVE/HPI:  HPI A very pleasant 80 y.o.Male presents today with cough, sore throat and nasal congestion for the last 2 days. Patient does not report any shortness of breath, chest pain, palpitations, visual changes, weakness, tingling, headache, nausea, vomiting, diarrhea, fever, chills.   has a past medical history of Diabetes mellitus without complication ([place]), Gilbert's syndrome, Hypertension, Kidney stones, Olecranon bursitis, Seizures ([place]), Stroke ([place]), and Subdural hematoma ([place]).  ROS:  Review of Systems See Subjective/HPI Medications, Allergies and Problem List personally reviewed in Epic today OBJECTIVE:   Vitals:   08/09/21 0833  BP: 132/71  Pulse: 73  Resp: 18  Temp: 99.3 F (37.4 C)  SpO2: 98%    Physical Exam   General: Appears well-developed and well-nourished. No acute distress.  HEENT Head: Normocephalic and atraumatic.   Ears: Hearing grossly intact, no drainage or visible deformity.  Nose: No nasal deviation.   Mouth/Throat: No stridor or tracheal deviation.  Non erythematous posterior pharynx noted with clear drainage present.  No white patchy exudate noted. Eyes: Conjunctivae and EOM are normal. No eye drainage or scleral icterus bilaterally.  Neck: Normal range of motion, neck is supple.  Cardiovascular: Normal rate. Regular rhythm; no murmurs, gallops, or rubs.  Pulm/Chest: No respiratory distress. Breath sounds normal bilaterally without wheezes, rhonchi, or rales.  Neurological: Alert and oriented to person, place, and time.  Skin: Skin is warm and dry.  No rashes, lesions, abrasions or bruising noted to skin.   Psychiatric: Normal mood, affect, behavior, and thought content.   Vital signs and nursing note reviewed.   Patient stable and cooperative with examination. PROCEDURES:    LABS/X-RAYS/EKG/MEDS:   No results found for  any visits on 08/09/21.  MEDICAL DECISION MAKING:   Patient presents with cough, sore throat and nasal congestion for the last 2 days. Patient does not report any shortness of breath, chest pain, palpitations, visual changes, weakness, tingling, headache, nausea, vomiting, diarrhea, fever, chills. COVID-19 pending.  Given symptoms along with assessment findings, likely viral illness.  Rx'd Flonase and Tessalon Perles to the patient's preferred pharmacy and advised about home treatment and care as outlined in her AVS to include rest, fluids, Mucinex.  Return to clinic for new high fever not improving the medications, chest pain, difficulty breathing, nonstop vomiting or coughing up blood.  Follow-up with PCP if not improving as expected in the next 5 to 7 days.  Patient verbalized understanding and agreed with treatment plan.  Patient stable upon discharge. ASSESSMENT/PLAN:  1. Encounter for screening for COVID-19 - Novel Coronavirus, NAA ([facility]); Standing - Novel Coronavirus, NAA ([facility])  2. Viral URI with cough - benzonatate (TESSALON) 100 MG capsule; Take 1 capsule (100 mg total) by mouth every 8 (eight) hours.  Dispense: 21 capsule; Refill: 0 - fluticasone (FLONASE) 50 MCG/ACT nasal spray; Place 2 sprays into both nostrils daily.  Dispense: 9.9 mL; Refill: 0 Instructions about new medications and side effects provided.  Plan:   Discharge Instructions      We will call you with any positive results from your COVID-19 testing completed in clinic today.  If you do not receive a phone call from [place] within the next 2-3 days, check your MyChart for up-to-date health information related to testing completed in clinic today.   For most people this is a self-limiting process and can take anywhere from  7 - 10 days to start feeling better. A cough can last up to 3 weeks. Pay special attention to handwashing as this can help prevent the spread of the virus.   Always read the labels of cough and  cold medications as they may contain some of the ingredients below.  Rest, push lots of fluids (especially water), and utilize supportive care for symptoms. You may take acetaminophen (Tylenol) every 4-6 hours and ibuprofen every 6-8 hours for muscle pain, joint pain, headaches (you may also alternate these medications). Mucinex (guaifenesin) may be taken over the counter for cough as needed can loosen phlegm. Please read the instructions and take as directed.  Sudafed (pseudophedrine) is sold behind the counter and can help reduce nasal pressure; avoid taking this if you have high blood pressure or feel jittery. Sudafed PE (phenylephrine) can be a helpful, short-term, over-the-counter alternative to limit side effects or if you have high blood pressure.  Flonase nasal spray can help alleviate congestion and sinus pressure. Many patients choose Afrin as a nasal decongestant; do not use for more than 3 days for risk of rebound (increased symptoms after stopping medication).  Saline nasal sprays or rinses can also help nasal congestion (use bottled or sterile water). Warm tea with lemon and honey can sooth sore throat and cough, as can cough drops.   Return to clinic for high fever not improving with medications, chest pain, difficulty breathing, non-stop vomiting, or coughing blood. Follow-up with your primary care provider if symptoms do not improve as expected in the next 5-7 days.          Serafina Royals, [place] 08/09/21 1000

## 2021-08-09 NOTE — Discharge Instructions (Signed)

## 2021-08-11 LAB — SARS-COV-2, NAA 2 DAY TAT

## 2021-08-11 LAB — NOVEL CORONAVIRUS, NAA: SARS-CoV-2, NAA: NOT DETECTED

## 2021-09-23 DIAGNOSIS — I69319 Unspecified symptoms and signs involving cognitive functions following cerebral infarction: Secondary | ICD-10-CM | POA: Diagnosis not present

## 2021-09-23 DIAGNOSIS — E7800 Pure hypercholesterolemia, unspecified: Secondary | ICD-10-CM | POA: Diagnosis not present

## 2021-09-23 DIAGNOSIS — N1831 Chronic kidney disease, stage 3a: Secondary | ICD-10-CM | POA: Diagnosis not present

## 2021-09-23 DIAGNOSIS — I2510 Atherosclerotic heart disease of native coronary artery without angina pectoris: Secondary | ICD-10-CM | POA: Diagnosis not present

## 2021-09-23 DIAGNOSIS — G250 Essential tremor: Secondary | ICD-10-CM | POA: Diagnosis not present

## 2021-09-23 DIAGNOSIS — E1122 Type 2 diabetes mellitus with diabetic chronic kidney disease: Secondary | ICD-10-CM | POA: Diagnosis not present

## 2021-09-23 DIAGNOSIS — I10 Essential (primary) hypertension: Secondary | ICD-10-CM | POA: Diagnosis not present

## 2021-10-31 DIAGNOSIS — N1831 Chronic kidney disease, stage 3a: Secondary | ICD-10-CM | POA: Diagnosis not present

## 2021-10-31 DIAGNOSIS — Z0000 Encounter for general adult medical examination without abnormal findings: Secondary | ICD-10-CM | POA: Diagnosis not present

## 2021-10-31 DIAGNOSIS — I2510 Atherosclerotic heart disease of native coronary artery without angina pectoris: Secondary | ICD-10-CM | POA: Diagnosis not present

## 2021-10-31 DIAGNOSIS — E1122 Type 2 diabetes mellitus with diabetic chronic kidney disease: Secondary | ICD-10-CM | POA: Diagnosis not present

## 2021-11-07 DIAGNOSIS — I2510 Atherosclerotic heart disease of native coronary artery without angina pectoris: Secondary | ICD-10-CM | POA: Diagnosis not present

## 2021-11-07 DIAGNOSIS — E7800 Pure hypercholesterolemia, unspecified: Secondary | ICD-10-CM | POA: Diagnosis not present

## 2021-11-07 DIAGNOSIS — I10 Essential (primary) hypertension: Secondary | ICD-10-CM | POA: Diagnosis not present

## 2021-11-07 DIAGNOSIS — J069 Acute upper respiratory infection, unspecified: Secondary | ICD-10-CM | POA: Diagnosis not present

## 2021-11-07 DIAGNOSIS — G40909 Epilepsy, unspecified, not intractable, without status epilepticus: Secondary | ICD-10-CM | POA: Diagnosis not present

## 2021-11-07 DIAGNOSIS — N1831 Chronic kidney disease, stage 3a: Secondary | ICD-10-CM | POA: Diagnosis not present

## 2021-11-07 DIAGNOSIS — E1122 Type 2 diabetes mellitus with diabetic chronic kidney disease: Secondary | ICD-10-CM | POA: Diagnosis not present

## 2021-11-07 DIAGNOSIS — I69319 Unspecified symptoms and signs involving cognitive functions following cerebral infarction: Secondary | ICD-10-CM | POA: Diagnosis not present

## 2021-11-25 DIAGNOSIS — E113393 Type 2 diabetes mellitus with moderate nonproliferative diabetic retinopathy without macular edema, bilateral: Secondary | ICD-10-CM | POA: Diagnosis not present

## 2022-02-03 DIAGNOSIS — E1122 Type 2 diabetes mellitus with diabetic chronic kidney disease: Secondary | ICD-10-CM | POA: Diagnosis not present

## 2022-02-03 DIAGNOSIS — M545 Low back pain, unspecified: Secondary | ICD-10-CM | POA: Diagnosis not present

## 2022-02-03 DIAGNOSIS — M5136 Other intervertebral disc degeneration, lumbar region: Secondary | ICD-10-CM | POA: Diagnosis not present

## 2022-02-03 DIAGNOSIS — N1831 Chronic kidney disease, stage 3a: Secondary | ICD-10-CM | POA: Diagnosis not present

## 2022-03-10 ENCOUNTER — Ambulatory Visit
Admission: RE | Admit: 2022-03-10 | Discharge: 2022-03-10 | Disposition: A | Discharge status: Home / Self Care | Payer: PPO | Source: Ambulatory Visit | Attending: Emergency Medicine | Admitting: Emergency Medicine

## 2022-03-10 ENCOUNTER — Ambulatory Visit (INDEPENDENT_AMBULATORY_CARE_PROVIDER_SITE_OTHER): Payer: PPO

## 2022-03-10 VITALS — BP 150/81 | HR 67 | Temp 97.9°F | Resp 18

## 2022-03-10 DIAGNOSIS — M79641 Pain in right hand: Secondary | ICD-10-CM

## 2022-03-10 DIAGNOSIS — Z23 Encounter for immunization: Secondary | ICD-10-CM | POA: Diagnosis not present

## 2022-03-10 DIAGNOSIS — M7989 Other specified soft tissue disorders: Secondary | ICD-10-CM | POA: Diagnosis not present

## 2022-03-10 DIAGNOSIS — L03113 Cellulitis of right upper limb: Secondary | ICD-10-CM | POA: Diagnosis not present

## 2022-03-10 MED ORDER — CEPHALEXIN 500 MG PO CAPS: 500.0000 mg | ORAL_CAPSULE | Freq: Four times a day (QID) | ORAL | 0 refills | Status: DC

## 2022-03-10 MED ORDER — TETANUS-DIPHTH-ACELL PERTUSSIS 5-2.5-18.5 LF-MCG/0.5 IM SUSY
0.5000 mL | PREFILLED_SYRINGE | Freq: Once | INTRAMUSCULAR | Status: AC
  Administered 2022-03-10: 0.5 mL via INTRAMUSCULAR

## 2022-03-10 NOTE — ED Provider Notes (Signed)
?[facility] ? ? ? ?CSN: [id number] ?Arrival date & time: 03/10/22  1634 ? ? ?  ? ?History   ?Chief Complaint ?Chief Complaint  ?Patient presents with  ? Puncture Wound  ?  splinter to palm of right hand on April 26. Area red and swollen this morning. - Entered by patient  ? ? ?HPI ?Gregory Humphrey is a 81 y.o. male.  Accompanied by his wife, patient presents with pain and swelling in his right hand today at the site where he removed a splinter from his hand 1 week ago.  The area was healing well until today.  No new injury.  No fever, chills, drainage, or other symptoms.  His medical history includes diabetes, hypertension, chronic respiratory failure, intracranial hemorrhage, left hemiparesis, NSTEMI, seizures.  Last tetanus in 2008 per chart. ? ?The history is provided by the patient, the spouse and medical records.  ? ?Past Medical History:  ?Diagnosis Date  ? Diabetes mellitus without complication ([place])   ? Gilbert's syndrome   ? Hypertension   ? Kidney stones   ? Olecranon bursitis   ? Seizures ([place])   ? Stroke [facility])   ? Subdural hematoma ([facility])   ? ? ?Patient Active Problem List  ? Diagnosis Date Noted  ? NSTEMI (non-ST elevated myocardial infarction) ([place]) 12/04/2016  ? Left hemiparesis ([place]) 11/11/2011  ? Tracheostomy in place [facility]) 11/11/2011  ? PNA (pneumonia) 11/04/2011  ? Chronic respiratory failure ([place]) 10/23/2011  ? Hyperglycemia 10/23/2011  ? Hypernatremia 10/23/2011  ? Purulent bronchitis ([place]) 10/23/2011  ? Physical deconditioning 10/23/2011  ? ICH (intracerebral hemorrhage) ([place]) 10/08/2011  ? CARBOHYDRATE METABOLISM DISORDER 05/30/2008  ? HYPERCHOLESTEROLEMIA 05/30/2008  ? GILBERT'S SYNDROME 05/30/2008  ? Essential hypertension, benign 05/30/2008  ? RENAL CALCULUS, HX OF 05/30/2008  ? ? ?Past Surgical History:  ?Procedure Laterality Date  ? CARDIAC CATHETERIZATION N/A 12/05/2016  ? Procedure: Left Heart Cath and Coronary Angiography;  Surgeon: Teodoro Spray, MD;  Location: [place]  CV LAB;  Service: Cardiovascular;  Laterality: N/A;  ? CARDIAC CATHETERIZATION N/A 12/05/2016  ? Procedure: Coronary Stent Intervention;  Surgeon: [place] Hampshire, MD;  Location: [place] CV LAB;  Service: Cardiovascular;  Laterality: N/A;  ? CRANIOTOMY  10/08/2011  ? Procedure: CRANIOTOMY HEMATOMA EVACUATION SUBDURAL;  Surgeon: Peggyann Shoals, MD;  Location: [place] NEURO ORS;  Service: Neurosurgery;  Laterality: Left;  Left Craniotomy for subdural  ? PEG PLACEMENT  10/16/2011  ? Procedure: PERCUTANEOUS ENDOSCOPIC GASTROSTOMY (PEG) PLACEMENT;  Surgeon: Zenovia Jarred, MD;  Location: [place];  Service: Gastroenterology;  Laterality: N/A;  ? REFRACTIVE SURGERY  11/10/1993  ? ? ? ? ? ?Home Medications   ? ?Prior to Admission medications   ?Medication Sig Start Date End Date Taking? Authorizing Provider  ?cephALEXin (KEFLEX) 500 MG capsule Take 1 capsule (500 mg total) by mouth 4 (four) times daily. 03/10/22  Yes Sharion Balloon, NP  ?aspirin EC 81 MG EC tablet Take 1 tablet (81 mg total) by mouth daily. 12/06/16   Demetrios Loll, MD  ?benzonatate (TESSALON) 100 MG capsule Take 1 capsule (100 mg total) by mouth every 8 (eight) hours. 08/09/21   Serafina Royals, FNP  ?clopidogrel (PLAVIX) 75 MG tablet Take 1 tablet (75 mg total) by mouth daily with breakfast. 12/07/16   Demetrios Loll, MD  ?fluticasone [facility]) 50 MCG/ACT nasal spray Place 2 sprays into both nostrils daily. 08/09/21   Serafina Royals, FNP  ?hydrALAZINE (APRESOLINE) 10 MG tablet Take 10 mg by mouth  3 (three) times daily. 10/23/15   [provider]  ?isosorbide mononitrate (IMDUR) 30 MG 24 hr tablet Take 30 mg by mouth daily.    [provider]  ?lamoTRIgine (LAMICTAL) 25 MG tablet Take 75 mg by mouth daily.  10/23/15   [provider]  ?lisinopril (PRINIVIL,ZESTRIL) 40 MG tablet Take 40 mg by mouth daily. 01/21/16   [provider]  ?metFORMIN (GLUCOPHAGE) 500 MG tablet Take 500 mg by mouth 2 (two) times daily. 04/08/16   [provider]  ?metoprolol succinate (TOPROL-XL) 50 MG 24 hr tablet Take 50 mg by mouth daily. 10/23/15   [provider]  ?Multiple Vitamin (MULTIVITAMIN) tablet Take 1 tablet by mouth daily.    [provider]  ?rosuvastatin (CRESTOR) 10 MG tablet Take 10 mg daily by mouth.    [provider]  ?vitamin C (ASCORBIC ACID) 500 MG tablet Take 500 mg by mouth daily.    [provider]  ? ? ?Family History ?Family History  ?Problem Relation Age of Onset  ? Stroke Mother   ? Hypertension Mother   ? Diabetes Mother   ? Cancer Father   ?     bone  ? Alcohol abuse Brother   ?     past  ? Diabetes Maternal Uncle   ? Stroke Maternal Grandfather   ? Prostate cancer Neg Hx   ? Bladder Cancer Neg Hx   ? Kidney cancer Neg Hx   ? ? ?Social History ?Social History  ? ?Tobacco Use  ? Smoking status: Never  ? Smokeless tobacco: Never  ?Vaping Use  ? Vaping Use: Never used  ?Substance Use Topics  ? Alcohol use: No  ?  Alcohol/week: 0.0 standard drinks  ? Drug use: No  ? ? ? ?Allergies   ?Patient has no known allergies. ? ? ?Review of Systems ?Review of Systems  ?Constitutional:  Negative for chills and fever.  ?Musculoskeletal:  Positive for arthralgias and joint swelling.  ?Skin:  Positive for color change and wound.  ?All other systems reviewed and are negative. ? ? ?Physical Exam ?Triage Vital Signs ?ED Triage Vitals  ?Enc Vitals Group  ?   BP   ?   Pulse   ?   Resp   ?   Temp   ?   Temp src   ?   SpO2   ?   Weight   ?   Height   ?   Head Circumference   ?   Peak Flow   ?   Pain Score   ?   Pain Loc   ?   Pain Edu?   ?   Excl. in [place]?   ? ?No data found. ? ?Updated Vital Signs ?BP (!) 150/81   Pulse 67   Temp 97.9 ?F (36.6 ?C)   Resp 18   SpO2 96%  ? ?Visual Acuity ?Right Eye Distance:   ?Left Eye Distance:   ?Bilateral Distance:   ? ?Right Eye Near:   ?Left Eye Near:    ?Bilateral Near:    ? ?Physical Exam ?Vitals and nursing note reviewed.  ?Constitutional:   ?   General: He is not in acute  distress. ?   Appearance: Normal appearance. He is well-developed. He is not ill-appearing.  ?HENT:  ?   Mouth/Throat:  ?   Mouth: Mucous membranes are moist.  ?Cardiovascular:  ?   Rate and Rhythm: Normal rate and regular rhythm.  ?   Heart  sounds: Normal heart sounds.  ?Pulmonary:  ?   Effort: Pulmonary effort is normal. No respiratory distress.  ?   Breath sounds: Normal breath sounds.  ?Musculoskeletal:     ?   General: Swelling and tenderness present. No deformity. Normal range of motion.  ?   Cervical back: Neck supple.  ?   Comments: Area of firm, mildly tender swelling on right palm at base of thumb.  Area of splinter wound closed and no drainage.  Mild erythema and ecchymosis.  See pictures for details.  Needle aspiration with scant blood return only.   ?Skin: ?   General: Skin is warm and dry.  ?   Capillary Refill: Capillary refill takes less than 2 seconds.  ?   Findings: Bruising, erythema and lesion present.  ?Neurological:  ?   Mental Status: He is alert and oriented to person, place, and time.  ?   Sensory: No sensory deficit.  ?   Motor: No weakness.  ?Psychiatric:     ?   Mood and Affect: Mood normal.     ?   Behavior: Behavior normal.  ? ? ? ? ? ? ? ? ?UC Treatments / Results  ?Labs ?(all labs ordered are listed, but only abnormal results are displayed) ?Labs Reviewed - No data to display ? ?EKG ? ? ?Radiology ?DG Hand Complete Right ? ?Result Date: 03/10/2022 ?CLINICAL DATA:  Swelling after removing splinter EXAM: RIGHT HAND - COMPLETE 3+ VIEW COMPARISON:  None. FINDINGS: There is no evidence of fracture or dislocation. Patchy osteopenia. Dorsal spurring at the index finger DIP joint. DJD in the radiocarpal joint. There is no other focal bone abnormality. No radiodense foreign body. No definite subcutaneous gas. Scattered arterial calcifications in the distal forearm. IMPRESSION: 1. No acute bone abnormality, radiodense foreign body or subcutaneous gas. Electronically Signed   By: Lucrezia Europe M.D.    On: 03/10/2022 17:20   ? ?Procedures ?Procedures (including critical care time) ? ?Medications Ordered in UC ?Medications  ?Tdap (BOOSTRIX) injection 0.5 mL (0.5 mLs Intramuscular Given 03/10/22 1708)  ? ? ?

## 2022-03-10 NOTE — ED Triage Notes (Signed)
Pt took a splinter out of his right hand 1 week ago. This morning he noticed hand swelling.  ?

## 2022-03-10 NOTE — Discharge Instructions (Addendum)
Your tetanus was updated today.   ? ?Take the antibiotic as directed.     ? ?Keep the area clean and dry.  Wash it gently with soap and water twice a day.   ? ?Follow up with your primary care provider in 2-3 days for a recheck.   ? ?

## 2022-03-13 DIAGNOSIS — L03113 Cellulitis of right upper limb: Secondary | ICD-10-CM | POA: Diagnosis not present

## 2022-03-13 DIAGNOSIS — G40909 Epilepsy, unspecified, not intractable, without status epilepticus: Secondary | ICD-10-CM | POA: Diagnosis not present

## 2022-03-24 DIAGNOSIS — E7800 Pure hypercholesterolemia, unspecified: Secondary | ICD-10-CM | POA: Diagnosis not present

## 2022-03-24 DIAGNOSIS — I2510 Atherosclerotic heart disease of native coronary artery without angina pectoris: Secondary | ICD-10-CM | POA: Diagnosis not present

## 2022-03-24 DIAGNOSIS — I10 Essential (primary) hypertension: Secondary | ICD-10-CM | POA: Diagnosis not present

## 2022-04-15 DIAGNOSIS — I10 Essential (primary) hypertension: Secondary | ICD-10-CM | POA: Diagnosis not present

## 2022-04-15 DIAGNOSIS — I2510 Atherosclerotic heart disease of native coronary artery without angina pectoris: Secondary | ICD-10-CM | POA: Diagnosis not present

## 2022-04-15 DIAGNOSIS — E1122 Type 2 diabetes mellitus with diabetic chronic kidney disease: Secondary | ICD-10-CM | POA: Diagnosis not present

## 2022-04-15 DIAGNOSIS — N1831 Chronic kidney disease, stage 3a: Secondary | ICD-10-CM | POA: Diagnosis not present

## 2022-04-15 DIAGNOSIS — E7800 Pure hypercholesterolemia, unspecified: Secondary | ICD-10-CM | POA: Diagnosis not present

## 2022-04-29 DIAGNOSIS — E113393 Type 2 diabetes mellitus with moderate nonproliferative diabetic retinopathy without macular edema, bilateral: Secondary | ICD-10-CM | POA: Diagnosis not present

## 2022-05-15 DIAGNOSIS — I2510 Atherosclerotic heart disease of native coronary artery without angina pectoris: Secondary | ICD-10-CM | POA: Diagnosis not present

## 2022-05-15 DIAGNOSIS — N1831 Chronic kidney disease, stage 3a: Secondary | ICD-10-CM | POA: Diagnosis not present

## 2022-05-15 DIAGNOSIS — E1122 Type 2 diabetes mellitus with diabetic chronic kidney disease: Secondary | ICD-10-CM | POA: Diagnosis not present

## 2022-05-15 DIAGNOSIS — E7800 Pure hypercholesterolemia, unspecified: Secondary | ICD-10-CM | POA: Diagnosis not present

## 2022-05-15 DIAGNOSIS — I10 Essential (primary) hypertension: Secondary | ICD-10-CM | POA: Diagnosis not present

## 2022-05-29 DIAGNOSIS — D2262 Melanocytic nevi of left upper limb, including shoulder: Secondary | ICD-10-CM | POA: Diagnosis not present

## 2022-05-29 DIAGNOSIS — L570 Actinic keratosis: Secondary | ICD-10-CM | POA: Diagnosis not present

## 2022-05-29 DIAGNOSIS — X32XXXA Exposure to sunlight, initial encounter: Secondary | ICD-10-CM | POA: Diagnosis not present

## 2022-05-29 DIAGNOSIS — C44319 Basal cell carcinoma of skin of other parts of face: Secondary | ICD-10-CM | POA: Diagnosis not present

## 2022-05-29 DIAGNOSIS — Z85828 Personal history of other malignant neoplasm of skin: Secondary | ICD-10-CM | POA: Diagnosis not present

## 2022-05-29 DIAGNOSIS — L538 Other specified erythematous conditions: Secondary | ICD-10-CM | POA: Diagnosis not present

## 2022-05-29 DIAGNOSIS — B078 Other viral warts: Secondary | ICD-10-CM | POA: Diagnosis not present

## 2022-05-29 DIAGNOSIS — Z85820 Personal history of malignant melanoma of skin: Secondary | ICD-10-CM | POA: Diagnosis not present

## 2022-05-29 DIAGNOSIS — Z86006 Personal history of melanoma in-situ: Secondary | ICD-10-CM | POA: Diagnosis not present

## 2022-05-29 DIAGNOSIS — L821 Other seborrheic keratosis: Secondary | ICD-10-CM | POA: Diagnosis not present

## 2022-05-29 DIAGNOSIS — D225 Melanocytic nevi of trunk: Secondary | ICD-10-CM | POA: Diagnosis not present

## 2022-05-29 DIAGNOSIS — D485 Neoplasm of uncertain behavior of skin: Secondary | ICD-10-CM | POA: Diagnosis not present

## 2022-06-02 DIAGNOSIS — N1831 Chronic kidney disease, stage 3a: Secondary | ICD-10-CM | POA: Diagnosis not present

## 2022-06-02 DIAGNOSIS — I69319 Unspecified symptoms and signs involving cognitive functions following cerebral infarction: Secondary | ICD-10-CM | POA: Diagnosis not present

## 2022-06-02 DIAGNOSIS — I10 Essential (primary) hypertension: Secondary | ICD-10-CM | POA: Diagnosis not present

## 2022-06-02 DIAGNOSIS — G40909 Epilepsy, unspecified, not intractable, without status epilepticus: Secondary | ICD-10-CM | POA: Diagnosis not present

## 2022-06-02 DIAGNOSIS — I2510 Atherosclerotic heart disease of native coronary artery without angina pectoris: Secondary | ICD-10-CM | POA: Diagnosis not present

## 2022-06-02 DIAGNOSIS — E1122 Type 2 diabetes mellitus with diabetic chronic kidney disease: Secondary | ICD-10-CM | POA: Diagnosis not present

## 2022-06-02 DIAGNOSIS — E7800 Pure hypercholesterolemia, unspecified: Secondary | ICD-10-CM | POA: Diagnosis not present

## 2022-08-19 DIAGNOSIS — C44319 Basal cell carcinoma of skin of other parts of face: Secondary | ICD-10-CM | POA: Diagnosis not present

## 2022-08-19 DIAGNOSIS — D2339 Other benign neoplasm of skin of other parts of face: Secondary | ICD-10-CM | POA: Diagnosis not present

## 2022-09-19 DIAGNOSIS — I2510 Atherosclerotic heart disease of native coronary artery without angina pectoris: Secondary | ICD-10-CM | POA: Diagnosis not present

## 2022-09-19 DIAGNOSIS — E1122 Type 2 diabetes mellitus with diabetic chronic kidney disease: Secondary | ICD-10-CM | POA: Diagnosis not present

## 2022-09-19 DIAGNOSIS — E7800 Pure hypercholesterolemia, unspecified: Secondary | ICD-10-CM | POA: Diagnosis not present

## 2022-09-19 DIAGNOSIS — N1831 Chronic kidney disease, stage 3a: Secondary | ICD-10-CM | POA: Diagnosis not present

## 2022-09-19 DIAGNOSIS — I10 Essential (primary) hypertension: Secondary | ICD-10-CM | POA: Diagnosis not present

## 2022-09-22 DIAGNOSIS — E7800 Pure hypercholesterolemia, unspecified: Secondary | ICD-10-CM | POA: Diagnosis not present

## 2022-09-22 DIAGNOSIS — N1831 Chronic kidney disease, stage 3a: Secondary | ICD-10-CM | POA: Diagnosis not present

## 2022-09-22 DIAGNOSIS — I2510 Atherosclerotic heart disease of native coronary artery without angina pectoris: Secondary | ICD-10-CM | POA: Diagnosis not present

## 2022-09-22 DIAGNOSIS — E7849 Other hyperlipidemia: Secondary | ICD-10-CM | POA: Diagnosis not present

## 2022-09-22 DIAGNOSIS — G250 Essential tremor: Secondary | ICD-10-CM | POA: Diagnosis not present

## 2022-09-22 DIAGNOSIS — E1122 Type 2 diabetes mellitus with diabetic chronic kidney disease: Secondary | ICD-10-CM | POA: Diagnosis not present

## 2022-09-22 DIAGNOSIS — I10 Essential (primary) hypertension: Secondary | ICD-10-CM | POA: Diagnosis not present

## 2022-10-08 DIAGNOSIS — E7800 Pure hypercholesterolemia, unspecified: Secondary | ICD-10-CM | POA: Diagnosis not present

## 2022-10-08 DIAGNOSIS — E1122 Type 2 diabetes mellitus with diabetic chronic kidney disease: Secondary | ICD-10-CM | POA: Diagnosis not present

## 2022-10-08 DIAGNOSIS — N1831 Chronic kidney disease, stage 3a: Secondary | ICD-10-CM | POA: Diagnosis not present

## 2022-10-08 DIAGNOSIS — I10 Essential (primary) hypertension: Secondary | ICD-10-CM | POA: Diagnosis not present

## 2022-10-15 DIAGNOSIS — I2510 Atherosclerotic heart disease of native coronary artery without angina pectoris: Secondary | ICD-10-CM | POA: Diagnosis not present

## 2022-10-15 DIAGNOSIS — I10 Essential (primary) hypertension: Secondary | ICD-10-CM | POA: Diagnosis not present

## 2022-10-15 DIAGNOSIS — G40909 Epilepsy, unspecified, not intractable, without status epilepticus: Secondary | ICD-10-CM | POA: Diagnosis not present

## 2022-10-15 DIAGNOSIS — N1831 Chronic kidney disease, stage 3a: Secondary | ICD-10-CM | POA: Diagnosis not present

## 2022-10-15 DIAGNOSIS — Z0000 Encounter for general adult medical examination without abnormal findings: Secondary | ICD-10-CM | POA: Diagnosis not present

## 2022-10-15 DIAGNOSIS — I69319 Unspecified symptoms and signs involving cognitive functions following cerebral infarction: Secondary | ICD-10-CM | POA: Diagnosis not present

## 2022-10-15 DIAGNOSIS — E7800 Pure hypercholesterolemia, unspecified: Secondary | ICD-10-CM | POA: Diagnosis not present

## 2022-10-15 DIAGNOSIS — E1122 Type 2 diabetes mellitus with diabetic chronic kidney disease: Secondary | ICD-10-CM | POA: Diagnosis not present

## 2022-10-27 DIAGNOSIS — E113393 Type 2 diabetes mellitus with moderate nonproliferative diabetic retinopathy without macular edema, bilateral: Secondary | ICD-10-CM | POA: Diagnosis not present

## 2022-11-24 DIAGNOSIS — H26491 Other secondary cataract, right eye: Secondary | ICD-10-CM | POA: Diagnosis not present

## 2022-11-24 DIAGNOSIS — I10 Essential (primary) hypertension: Secondary | ICD-10-CM | POA: Diagnosis not present

## 2022-11-24 DIAGNOSIS — E119 Type 2 diabetes mellitus without complications: Secondary | ICD-10-CM | POA: Diagnosis not present

## 2022-12-15 DIAGNOSIS — L821 Other seborrheic keratosis: Secondary | ICD-10-CM | POA: Diagnosis not present

## 2022-12-15 DIAGNOSIS — Z86006 Personal history of melanoma in-situ: Secondary | ICD-10-CM | POA: Diagnosis not present

## 2022-12-15 DIAGNOSIS — L570 Actinic keratosis: Secondary | ICD-10-CM | POA: Diagnosis not present

## 2022-12-15 DIAGNOSIS — Z85820 Personal history of malignant melanoma of skin: Secondary | ICD-10-CM | POA: Diagnosis not present

## 2022-12-15 DIAGNOSIS — Z85828 Personal history of other malignant neoplasm of skin: Secondary | ICD-10-CM | POA: Diagnosis not present

## 2022-12-15 DIAGNOSIS — D2271 Melanocytic nevi of right lower limb, including hip: Secondary | ICD-10-CM | POA: Diagnosis not present

## 2022-12-15 DIAGNOSIS — D225 Melanocytic nevi of trunk: Secondary | ICD-10-CM | POA: Diagnosis not present

## 2023-01-11 ENCOUNTER — Encounter: Payer: Self-pay | Admitting: Emergency Medicine

## 2023-01-11 ENCOUNTER — Emergency Department: Payer: PPO

## 2023-01-11 ENCOUNTER — Observation Stay (HOSPITAL_BASED_OUTPATIENT_CLINIC_OR_DEPARTMENT_OTHER)
Admit: 2023-01-11 | Discharge: 2023-01-11 | Disposition: A | Discharge status: Home / Self Care | Payer: PPO | Attending: Family Medicine | Admitting: Family Medicine

## 2023-01-11 ENCOUNTER — Observation Stay
Admission: EM | Admit: 2023-01-11 | Discharge: 2023-01-12 | Disposition: A | Discharge status: Home / Self Care | Payer: PPO | Attending: Internal Medicine | Admitting: Internal Medicine

## 2023-01-11 DIAGNOSIS — Z79899 Other long term (current) drug therapy: Secondary | ICD-10-CM | POA: Insufficient documentation

## 2023-01-11 DIAGNOSIS — S065XAA Traumatic subdural hemorrhage with loss of consciousness status unknown, initial encounter: Secondary | ICD-10-CM | POA: Diagnosis not present

## 2023-01-11 DIAGNOSIS — Z8679 Personal history of other diseases of the circulatory system: Secondary | ICD-10-CM

## 2023-01-11 DIAGNOSIS — E7800 Pure hypercholesterolemia, unspecified: Secondary | ICD-10-CM | POA: Diagnosis present

## 2023-01-11 DIAGNOSIS — Z7982 Long term (current) use of aspirin: Secondary | ICD-10-CM | POA: Diagnosis not present

## 2023-01-11 DIAGNOSIS — Z7984 Long term (current) use of oral hypoglycemic drugs: Secondary | ICD-10-CM | POA: Insufficient documentation

## 2023-01-11 DIAGNOSIS — G459 Transient cerebral ischemic attack, unspecified: Secondary | ICD-10-CM

## 2023-01-11 DIAGNOSIS — G40909 Epilepsy, unspecified, not intractable, without status epilepticus: Secondary | ICD-10-CM

## 2023-01-11 DIAGNOSIS — R739 Hyperglycemia, unspecified: Secondary | ICD-10-CM | POA: Diagnosis present

## 2023-01-11 DIAGNOSIS — E1122 Type 2 diabetes mellitus with diabetic chronic kidney disease: Secondary | ICD-10-CM | POA: Diagnosis not present

## 2023-01-11 DIAGNOSIS — I6523 Occlusion and stenosis of bilateral carotid arteries: Secondary | ICD-10-CM | POA: Diagnosis not present

## 2023-01-11 DIAGNOSIS — Z794 Long term (current) use of insulin: Secondary | ICD-10-CM

## 2023-01-11 DIAGNOSIS — E785 Hyperlipidemia, unspecified: Secondary | ICD-10-CM | POA: Diagnosis not present

## 2023-01-11 DIAGNOSIS — I639 Cerebral infarction, unspecified: Secondary | ICD-10-CM | POA: Diagnosis not present

## 2023-01-11 DIAGNOSIS — R569 Unspecified convulsions: Secondary | ICD-10-CM

## 2023-01-11 DIAGNOSIS — E1165 Type 2 diabetes mellitus with hyperglycemia: Secondary | ICD-10-CM | POA: Insufficient documentation

## 2023-01-11 DIAGNOSIS — I619 Nontraumatic intracerebral hemorrhage, unspecified: Secondary | ICD-10-CM | POA: Diagnosis present

## 2023-01-11 DIAGNOSIS — I10 Essential (primary) hypertension: Secondary | ICD-10-CM | POA: Diagnosis present

## 2023-01-11 DIAGNOSIS — R531 Weakness: Secondary | ICD-10-CM | POA: Diagnosis not present

## 2023-01-11 DIAGNOSIS — R29898 Other symptoms and signs involving the musculoskeletal system: Secondary | ICD-10-CM | POA: Diagnosis not present

## 2023-01-11 DIAGNOSIS — I2510 Atherosclerotic heart disease of native coronary artery without angina pectoris: Secondary | ICD-10-CM | POA: Diagnosis present

## 2023-01-11 DIAGNOSIS — E119 Type 2 diabetes mellitus without complications: Secondary | ICD-10-CM

## 2023-01-11 DIAGNOSIS — R29818 Other symptoms and signs involving the nervous system: Secondary | ICD-10-CM | POA: Diagnosis present

## 2023-01-11 LAB — URINE DRUG SCREEN, QUALITATIVE (ARMC ONLY)
Amphetamines, Ur Screen: NOT DETECTED
Barbiturates, Ur Screen: NOT DETECTED
Benzodiazepine, Ur Scrn: NOT DETECTED
Cannabinoid 50 Ng, Ur ~~LOC~~: NOT DETECTED
Cocaine Metabolite,Ur ~~LOC~~: NOT DETECTED
MDMA (Ecstasy)Ur Screen: NOT DETECTED
Methadone Scn, Ur: NOT DETECTED
Opiate, Ur Screen: NOT DETECTED
Phencyclidine (PCP) Ur S: NOT DETECTED
Tricyclic, Ur Screen: NOT DETECTED

## 2023-01-11 LAB — COMPREHENSIVE METABOLIC PANEL
ALT: 36 U/L (ref 0–44)
AST: 28 U/L (ref 15–41)
Albumin: 3.9 g/dL (ref 3.5–5.0)
Alkaline Phosphatase: 37 U/L — ABNORMAL LOW (ref 38–126)
Anion gap: 8 (ref 5–15)
BUN: 19 mg/dL (ref 8–23)
CO2: 25 mmol/L (ref 22–32)
Calcium: 9.3 mg/dL (ref 8.9–10.3)
Chloride: 102 mmol/L (ref 98–111)
Creatinine, Ser: 1.01 mg/dL (ref 0.61–1.24)
GFR, Estimated: >60 mL/min (ref >60)
Glucose, Bld: 187 mg/dL — ABNORMAL HIGH (ref 70–99)
Potassium: 3.7 mmol/L (ref 3.5–5.1)
Sodium: 135 mmol/L (ref 135–145)
Total Bilirubin: 1.2 mg/dL (ref 0.3–1.2)
Total Protein: 6.6 g/dL (ref 6.5–8.1)

## 2023-01-11 LAB — CBC
HCT: 41.7 % (ref 39.0–52.0)
Hemoglobin: 14.5 g/dL (ref 13.0–17.0)
MCH: 31.9 pg (ref 26.0–34.0)
MCHC: 34.8 g/dL (ref 30.0–36.0)
MCV: 91.9 fL (ref 80.0–100.0)
Platelets: 129 10*3/uL — ABNORMAL LOW (ref 150–400)
RBC: 4.54 MIL/uL (ref 4.22–5.81)
RDW: 13.3 % (ref 11.5–15.5)
WBC: 3.8 10*3/uL — ABNORMAL LOW (ref 4.0–10.5)
nRBC: 0 % (ref 0.0–0.2)

## 2023-01-11 LAB — DIFFERENTIAL
Abs Immature Granulocytes: 0.01 10*3/uL (ref 0.00–0.07)
Basophils Absolute: 0 10*3/uL (ref 0.0–0.1)
Basophils Relative: 1 %
Eosinophils Absolute: 0.1 10*3/uL (ref 0.0–0.5)
Eosinophils Relative: 1 %
Immature Granulocytes: 0 %
Lymphocytes Relative: 26 %
Lymphs Abs: 1 10*3/uL (ref 0.7–4.0)
Monocytes Absolute: 0.4 10*3/uL (ref 0.1–1.0)
Monocytes Relative: 10 %
Neutro Abs: 2.4 10*3/uL (ref 1.7–7.7)
Neutrophils Relative %: 62 %

## 2023-01-11 LAB — ECHOCARDIOGRAM COMPLETE
AR max vel: 2.88 cm2
AV Peak grad: 4.5 mmHg
Ao pk vel: 1.06 m/s
Area-P 1/2: 2.66 cm2
Height: 70 in
P 1/2 time: 708 msec
S' Lateral: 2.8 cm
Weight: 3033.53 oz

## 2023-01-11 LAB — URINALYSIS, ROUTINE W REFLEX MICROSCOPIC
Bilirubin Urine: NEGATIVE
Glucose, UA: NEGATIVE mg/dL
Hgb urine dipstick: NEGATIVE
Ketones, ur: NEGATIVE mg/dL
Leukocytes,Ua: NEGATIVE
Nitrite: NEGATIVE
Protein, ur: NEGATIVE mg/dL
Specific Gravity, Urine: 1.015 (ref 1.005–1.030)
pH: 7 (ref 5.0–8.0)

## 2023-01-11 LAB — APTT: aPTT: 30 seconds (ref 24–36)

## 2023-01-11 LAB — GLUCOSE, CAPILLARY: Glucose-Capillary: 173 mg/dL — ABNORMAL HIGH (ref 70–99)

## 2023-01-11 LAB — CBG MONITORING, ED: Glucose-Capillary: 180 mg/dL — ABNORMAL HIGH (ref 70–99)

## 2023-01-11 LAB — PROTIME-INR
INR: 1.1 (ref 0.8–1.2)
Prothrombin Time: 13.7 seconds (ref 11.4–15.2)

## 2023-01-11 LAB — ETHANOL: Alcohol, Ethyl (B): <10 mg/dL (ref <10)

## 2023-01-11 MED ORDER — ROSUVASTATIN CALCIUM 10 MG PO TABS: 10.0000 mg | ORAL_TABLET | Freq: Every day | ORAL | Status: DC

## 2023-01-11 MED ORDER — LABETALOL HCL 5 MG/ML IV SOLN: 10.0000 mg | INTRAVENOUS | Status: DC | PRN

## 2023-01-11 MED ORDER — ROSUVASTATIN CALCIUM 20 MG PO TABS: 40.0000 mg | ORAL_TABLET | Freq: Every day | ORAL | Status: DC

## 2023-01-11 MED ORDER — ISOSORBIDE MONONITRATE ER 30 MG PO TB24
30.0000 mg | ORAL_TABLET | Freq: Every day | ORAL | Status: DC
  Administered 2023-01-12: 30 mg via ORAL
  Filled 2023-01-11: qty 1

## 2023-01-11 MED ORDER — ACETAMINOPHEN 500 MG PO TABS
1000.0000 mg | ORAL_TABLET | Freq: Three times a day (TID) | ORAL | Status: DC | PRN
  Administered 2023-01-11 – 2023-01-12 (×2): 1000 mg via ORAL
  Filled 2023-01-11 (×2): qty 2

## 2023-01-11 MED ORDER — HYDRALAZINE HCL 10 MG PO TABS: 10.0000 mg | ORAL_TABLET | Freq: Three times a day (TID) | ORAL | Status: DC

## 2023-01-11 MED ORDER — ASPIRIN 81 MG PO CHEW
324.0000 mg | CHEWABLE_TABLET | Freq: Once | ORAL | Status: AC
  Administered 2023-01-11: 324 mg via ORAL
  Filled 2023-01-11: qty 4

## 2023-01-11 MED ORDER — IOHEXOL 350 MG/ML SOLN
75.0000 mL | Freq: Once | INTRAVENOUS | Status: AC | PRN
  Administered 2023-01-11: 75 mL via INTRAVENOUS

## 2023-01-11 MED ORDER — STROKE: EARLY STAGES OF RECOVERY BOOK: Freq: Once | Status: AC

## 2023-01-11 MED ORDER — LISINOPRIL 10 MG PO TABS: 40.0000 mg | ORAL_TABLET | Freq: Every day | ORAL | Status: DC

## 2023-01-11 MED ORDER — LAMOTRIGINE 25 MG PO TABS
75.0000 mg | ORAL_TABLET | Freq: Every day | ORAL | Status: DC
  Administered 2023-01-12: 75 mg via ORAL
  Filled 2023-01-11: qty 3

## 2023-01-11 MED ORDER — ASPIRIN 81 MG PO TBEC
81.0000 mg | DELAYED_RELEASE_TABLET | Freq: Every day | ORAL | Status: DC
  Administered 2023-01-12: 81 mg via ORAL
  Filled 2023-01-11: qty 1

## 2023-01-11 MED ORDER — VITAMIN C 500 MG PO TABS
500.0000 mg | ORAL_TABLET | Freq: Every day | ORAL | Status: DC
  Administered 2023-01-12: 500 mg via ORAL
  Filled 2023-01-11: qty 1

## 2023-01-11 MED ORDER — CLOPIDOGREL BISULFATE 75 MG PO TABS
300.0000 mg | ORAL_TABLET | ORAL | Status: AC
  Administered 2023-01-11: 300 mg via ORAL
  Filled 2023-01-11: qty 4

## 2023-01-11 MED ORDER — METOPROLOL SUCCINATE ER 50 MG PO TB24: 50.0000 mg | ORAL_TABLET | Freq: Every day | ORAL | Status: DC

## 2023-01-11 MED ORDER — ADULT MULTIVITAMIN W/MINERALS CH
1.0000 | ORAL_TABLET | Freq: Every day | ORAL | Status: DC
  Administered 2023-01-12: 1 via ORAL
  Filled 2023-01-11: qty 1

## 2023-01-11 NOTE — Assessment & Plan Note (Signed)
Lipid profile pending  Cont statin

## 2023-01-11 NOTE — ED Triage Notes (Signed)
Code stroke called my MD Mumma, pt taken straight to CT

## 2023-01-11 NOTE — Consult Note (Signed)
Neurology Consultation Reason for Consult: Left-sided weakness Referring Physician: Mumma, S  CC: Left-sided weakness  History is obtained from: Patient, wife  HPI: Gregory Humphrey is a 82 y.o. male with a previous history of subdural hematoma treated with craniectomy in 2011 who presents with left-sided weakness.  He initially noticed it at 9 AM, but then completely resolved and was back to his normal baseline.  Then subsequently at 9:40 AM the symptoms recurred and he was brought in to the emergency department where he was flaccid on arrival.  On initial head CT, there was noted to subdural fluid collection, felt most likely be chronic related to his previous surgery, but given some slight increase in density compared to CSF, I took him to MRI to confirm that this was not subacute blood.  By the time MRI was started, his symptoms had completely resolved.  With return of NIH to zero.   LKW: 9:40 AM tnk given?: no, resolved mild symptoms   Past Medical History:  Diagnosis Date   Diabetes mellitus without complication (HCC)    Gilbert's syndrome    Hypertension    Kidney stones    Olecranon bursitis    Seizures (H[facility])    Stroke ([facility])    Subdural hematoma ([facility])      Family History  Problem Relation Age of Onset   Stroke Mother    Hypertension Mother    Diabetes Mother    Cancer Father        bone   Alcohol abuse Brother        past   Diabetes Maternal Uncle    Stroke Maternal Grandfather    Prostate cancer Neg Hx    Bladder Cancer Neg Hx    Kidney cancer Neg Hx      Social History:  reports that he has never smoked. He has never used smokeless tobacco. He reports that he does not drink alcohol and does not use drugs.   Exam: Current vital signs: BP (!) 165/86   Pulse (!) 56   Temp 98.4 F (36.9 C) (Oral)   Resp 12   Ht '5\' 10"'$  (1.778 m)   Wt 86 kg   SpO2 95%   BMI 27.20 kg/m  Vital signs in last 24 hours: Temp:  [98.4 F (36.9 C)] 98.4 F (36.9 C) (03/03  1052) Pulse Rate:  [56-60] 56 (03/03 1100) Resp:  [11-14] 12 (03/03 1100) BP: (165-186)/(86-95) 165/86 (03/03 1100) SpO2:  [95 %-97 %] 95 % (03/03 1100) Weight:  [86 kg] 86 kg (03/03 1109)   Physical Exam  Appears well-developed and well-nourished.   Neuro: Mental Status: Patient is awake, alert, oriented to person, place, month, year, and situation. Patient is able to give a clear and coherent history. No signs of aphasia or neglect Cranial Nerves: II: Visual Fields are full. Pupils are equal, round, and reactive to light.   III,IV, VI: EOMI without ptosis or diploplia.  V: Facial sensation is symmetric to temperature VII: Facial movement is symmetric.  VIII: hearing is intact to voice X: Uvula elevates symmetrically XI: Shoulder shrug is symmetric. XII: tongue is midline without atrophy or fasciculations.  Motor: Tone is normal. Bulk is normal. 5/5 strength was present in all four extremities.  Sensory: Sensation is symmetric to light touch and temperature in the arms and legs. Deep Tendon Reflexes: 2+ and symmetric in the biceps and patellae.  Plantars: Toes are downgoing bilaterally.  Cerebellar: FNF and HKS are intact bilaterally  When I initially  evaluated him, he had severe left hemiplegia with no movement of the left arm and only minimal movement of the left leg.  This subsequently resolved.  He never had any field cut or gaze deviation, no evidence of neglect even when hemiplegic.    I have reviewed labs in epic and the results pertinent to this consultation are: CBG 180  I have reviewed the images obtained: CT/CTA-chronic appearing fluid collection with no large vessel occlusion MRI-confirm chronic nature of fluid collection  Impression: 82 year old male with what is likely stuttering small vessel TIA.  His symptoms are currently resolved and therefore I would not recommend IV tenecteplase, but if he were to reworsen we could reactivate a code stroke and  consider thrombolytics at that time.  I would favor restarting his clock at this time with negative MRI.  I have low suspicion for alternative etiology such as seizure given the preservation of mental status with severe hemiplegia.  I would favor permissive hypertension as well as an acute load with Plavix (already on aspirin)  Recommendations: - HgbA1c, fasting lipid panel - Frequent neuro checks - Echocardiogram - Prophylactic therapy-Antiplatelet med: Aspirin - dose '81mg'$  and plavix '75mg'$  daily  after '300mg'$  load  - Risk factor modification - Telemetry monitoring - PT consult, OT consult, Speech consult   Roland Rack, MD [facility] [phone]  If 7pm- 7am, please page neurology on call as listed in AMION.

## 2023-01-11 NOTE — Assessment & Plan Note (Signed)
No active chest pain EKG normal sinus rhythm Follow

## 2023-01-11 NOTE — Assessment & Plan Note (Addendum)
Noted transient left-sided weakness that started around 9 AM today that initially resolved and then recurred prior to arrival to ER. Had L sided weakness that progressively resolved in process of code stroke evaluation.  Not felt to be a tenecteplase candidate given resolution of symptoms Imaging in the ER including CT of the head neck, CT head as well as MRI of the brain grossly negative for any acute findings Case formally evaluated by neurologist Dr. Leonel Ramsay Agree for formal CVA eval including restratification labs and 2D echo Started on dual antiplatelet therapy with aspirin and Plavix status post Plavix load (already on baby aspirin prior to admission) Tele monitor Serial neuro checks  Follow up further neuro recommendations.

## 2023-01-11 NOTE — Assessment & Plan Note (Addendum)
Status post intracranial hemorrhage from 2012 Chronic slurred speech and appears to be at baseline Follow

## 2023-01-11 NOTE — Evaluation (Signed)
Physical Therapy Evaluation Patient Details Name: Gregory Humphrey MRN: [id number] DOB: 1941-02-16 Today's Date: 01/11/2023  History of Present Illness  Pt is an 82 y.o. male with medical history significant of type II by diabetes, hypertension, CAD, stroke, seizure disorder, history of subdural hematoma presenting with code stroke.  Per report patient developed sudden onset of left-sided weakness, slurred speech.  Symptoms resolved and approximately 45 minutes later symptoms returned with persistence.  MD assessment includes: TIA with L sided weakness that progressively resolved in process of code stroke evaluation and hyperglycemia.   Clinical Impression  Pt was pleasant and motivated to participate during the session and put forth good effort throughout. Pt required no physical assistance with functional tasks and was generally steady upon coming to initial stand at the EOB.  Pt initially ambulated with a RW with min verbal cues for amb closer to the RW with upright posture with good cadence, control and stability.  Pt transitioned to amb without an AD and was able to amb 150 feet with noted decrease in cadence as well as inconsistency with cadence.  Pt also presented with min drifting right/left while walking without an AD but was able to self-correct with no overt LOB.  Pt reported feeling minimally less steady than at baseline with no other deficits noted during the session compared to his baseline.  Pt will benefit from OPPT upon discharge to safely address deficits listed in patient problem list for decreased caregiver assistance and eventual return to PLOF.          Recommendations for follow up therapy are one component of a multi-disciplinary discharge planning process, led by the attending physician.  Recommendations may be updated based on patient status, additional functional criteria and insurance authorization.  Follow Up Recommendations Outpatient PT      Assistance Recommended at  Discharge Intermittent Supervision/Assistance  Patient can return home with the following  A little help with walking and/or transfers;A little help with bathing/dressing/bathroom;Assistance with cooking/housework;Assist for transportation    Equipment Recommendations None recommended by PT  Recommendations for Other Services       Functional Status Assessment Patient has had a recent decline in their functional status and demonstrates the ability to make significant improvements in function in a reasonable and predictable amount of time.     Precautions / Restrictions Precautions Precautions: Fall Restrictions Weight Bearing Restrictions: No      Mobility  Bed Mobility Overal bed mobility: Modified Independent             General bed mobility comments: Min extra time and effort only    Transfers Overall transfer level: Needs assistance Equipment used: Rolling walker (2 wheels), None Transfers: Sit to/from Stand Sit to Stand: Supervision           General transfer comment: Good eccentric and concentric control and stability    Ambulation/Gait Ambulation/Gait assistance: Supervision Gait Distance (Feet): 50 Feet x 1 with RW, 150 Feet x 1 without an AD Assistive device: Rolling walker (2 wheels), None Gait Pattern/deviations: Step-through pattern, Decreased step length - right, Decreased step length - left, Drifts right/left Gait velocity: decreased     General Gait Details: Pt ambulated with RW initially x 50 feet with good control and stability; pt progressed to amb without an AD with no overt LOB but with min drifting left/right and with decreased cadence compared to with the [employer]  Modified Rankin (Stroke Patients Only)       Balance Overall balance assessment: Needs assistance   Sitting balance-Leahy Scale: Normal     Standing balance support: No upper extremity supported Standing balance-Leahy Scale:  Good                               Pertinent Vitals/Pain Pain Assessment Pain Assessment: 0-10 Pain Score: 4  Pain Location: back Pain Descriptors / Indicators: Sore Pain Intervention(s): Repositioned, Monitored during session, Other (comment) (Pt stated no need for pain meds)    Home Living Family/patient expects to be discharged to:: Private residence Living Arrangements: Spouse/significant other Available Help at Discharge: Family;Available 24 hours/day Type of Home: House Home Access: Level entry       Home Layout: One level Home Equipment: Rolling Walker (2 wheels);Grab bars - tub/shower;Shower seat - built in      Prior Function               Mobility Comments: Ind amb community distances without an AD, exercises at the [facility] and walks in the mall, no fall history ADLs Comments: Min A with ADLs from spouse     Hand Dominance   Dominant Hand: Right    Extremity/Trunk Assessment   Upper Extremity Assessment Upper Extremity Assessment: Overall WFL for tasks assessed;RUE deficits/detail;LUE deficits/detail RUE Deficits / Details: RUE grip, shoulder, and elbow strength 5/5 RUE Sensation: WNL RUE Coordination: WNL LUE Deficits / Details: LUE grip, shoulder, and elbow strength 5/5 LUE Sensation: WNL LUE Coordination: WNL    Lower Extremity Assessment Lower Extremity Assessment: Overall WFL for tasks assessed;RLE deficits/detail;LLE deficits/detail RLE Deficits / Details: RLE knee and hip strength 5/5 RLE Sensation: WNL RLE Coordination: WNL LLE Deficits / Details: LLE knee and hip strength 5/5 LLE Sensation: WNL LLE Coordination: WNL       Communication   Communication: No difficulties  Cognition Arousal/Alertness: Awake/alert Behavior During Therapy: WFL for tasks assessed/performed Overall Cognitive Status: Within Functional Limits for tasks assessed                                          General Comments       Exercises     Assessment/Plan    PT Assessment Patient needs continued PT services  PT Problem List Decreased balance;Decreased knowledge of use of DME       PT Treatment Interventions DME instruction;Gait training;Functional mobility training;Therapeutic activities;Therapeutic exercise;Balance training;Patient/family education    PT Goals (Current goals can be found in the Care Plan section)  Acute Rehab PT Goals Patient Stated Goal: To get better and return home PT Goal Formulation: With patient Time For Goal Achievement: 01/24/23 Potential to Achieve Goals: Good    Frequency Min 2X/week     Co-evaluation               AM-PAC PT "6 Clicks" Mobility  Outcome Measure Help needed turning from your back to your side while in a flat bed without using bedrails?: None Help needed moving from lying on your back to sitting on the side of a flat bed without using bedrails?: None Help needed moving to and from a bed to a chair (including a wheelchair)?: A Little Help needed standing up from a chair using your arms (e.g., wheelchair or bedside chair)?: A Little Help needed to walk  in hospital room?: A Little Help needed climbing 3-5 steps with a railing? : A Little 6 Click Score: 20    End of Session Equipment Utilized During Treatment: Gait belt Activity Tolerance: Patient tolerated treatment well Patient left: in chair;with call bell/phone within reach;with chair alarm set;with family/visitor present Nurse Communication: Mobility status PT Visit Diagnosis: Unsteadiness on feet (R26.81);Difficulty in walking, not elsewhere classified (R26.2)    Time: [id number] PT Time Calculation (min) (ACUTE ONLY): 38 min   Charges:   PT Evaluation $PT Eval Moderate Complexity: 1 Mod PT Treatments $Gait Training: 8-22 mins      D. Royetta Asal PT, DPT 01/11/23, 5:13 PM

## 2023-01-11 NOTE — Progress Notes (Signed)
   01/11/23 1000  Spiritual Encounters  Type of Visit Initial  Care provided to: Pt and family  Referral source Code page  Reason for visit Code  OnCall Visit Yes  Spiritual Framework  Presenting Themes Caregiving needs  Patient Stress Factors Health changes  Family Stress Factors Health changes  Intervention Outcomes  Outcomes Connection to spiritual care   Chaplain responded to Code Stroke to provide spiritual care for patient's wife while patient was at CT and to provide brief support to patient when he arrived in the room.

## 2023-01-11 NOTE — ED Notes (Signed)
Lunch tray ordered 

## 2023-01-11 NOTE — ED Provider Notes (Signed)
[name] Provider Note    Event Date/Time   First MD Initiated Contact with Patient 01/11/23 [phone]     (approximate)   History   Code Stroke   HPI  Gregory Humphrey is a 82 y.o. male past medical history significant for prior subdural, in 2012, CAD, hypertension, hyperlipidemia, who presents to the emergency department with strokelike symptoms.  Patient's last known well was at 9:00.  Was getting out of the car to go to church and having slurring of speech and a left-sided weakness.  When fire arrived patient's symptoms had resolved and he had a NIH score of 0.  Patient was en route with EMS and then had recurrence of his symptoms with slurring of speech and left-sided weakness.  Recurrence of symptoms started at 945.  Patient had a history of subdural in 2012.  Not on anticoagulation.  History of being on Plavix in the past but not currently on Plavix.     Physical Exam   Triage Vital Signs: ED Triage Vitals  Enc Vitals Group     BP      Pulse      Resp      Temp      Temp src      SpO2      Weight      Height      Head Circumference      Peak Flow      Pain Score      Pain Loc      Pain Edu?      Excl. in [place]?     Most recent vital signs: Vitals:   01/11/23 1052 01/11/23 1100  BP: (!) 174/95 (!) 165/86  Pulse: (!) 58 (!) 56  Resp: 11 12  Temp: 98.4 F (36.9 C)   SpO2: 97% 95%    Physical Exam Constitutional:      Appearance: He is well-developed.  HENT:     Head: Atraumatic.  Eyes:     Conjunctiva/sclera: Conjunctivae normal.  Cardiovascular:     Rate and Rhythm: Regular rhythm.  Pulmonary:     Effort: No respiratory distress.  Musculoskeletal:     Cervical back: Normal range of motion.  Skin:    General: Skin is warm.  Neurological:     Mental Status: He is alert.     Cranial Nerves: Dysarthria and facial asymmetry present.     Motor: Weakness and pronator drift present.     Comments: 1/5 strength to left lower  extremity, left-sided pronator drift 1/5 strength.  5/5 strength bilateral upper and lower extremities to the right side.     IMPRESSION / MDM / ASSESSMENT AND PLAN / ED COURSE  I reviewed the triage vital signs and the nursing notes.  Patient presented to the emergency department with strokelike symptoms.  Last known well was at 9:00, resolution in symptoms and then recurrence at 945.  Patient was made an activated code stroke upon arrival to the emergency department.  Immediately taken to CT scanner.  CT scan of the head with questionable area of old subdural with area of hyperdensity, does not appear to be clearly an acute bleed, appears more subacute.  Dr. Leonel Ramsay with stroke neurology discussed with the radiologist and another neurologist, ultimately went to obtain a stat MRI to further evaluate the hyperdensity for possible consideration of TNK.  No large vessel on CTA  Patient taken stat to MRI.  Patient's wife, Apolonio Schneiders at bedside.  EKG  Deeann Saint, the attending physician, personally viewed and interpreted this ECG.   Rate: Normal  Rhythm: Normal sinus  Axis: Normal  Intervals: Normal  ST&T Change: None  No tachycardic or bradycardic dysrhythmias while on cardiac telemetry.  RADIOLOGY I independently reviewed imaging, my interpretation of imaging: CT head with remote left craniotomy and a fluid collection with area of hyperdensity.  Read as left inferior frontal and temporal encephalomalacia, high density does not continue.  LABS (all labs ordered are listed, but only abnormal results are displayed) Labs interpreted as -    Labs Reviewed  CBC - Abnormal; Notable for the following components:      Result Value   WBC 3.8 (*)    Platelets 129 (*)    All other components within normal limits  COMPREHENSIVE METABOLIC PANEL - Abnormal; Notable for the following components:   Glucose, Bld 187 (*)    Alkaline Phosphatase 37 (*)    All other components within  normal limits  URINALYSIS, ROUTINE W REFLEX MICROSCOPIC - Abnormal; Notable for the following components:   Color, Urine STRAW (*)    APPearance CLEAR (*)    All other components within normal limits  CBG MONITORING, ED - Abnormal; Notable for the following components:   Glucose-Capillary 180 (*)    All other components within normal limits  ETHANOL  PROTIME-INR  APTT  DIFFERENTIAL  URINE DRUG SCREEN, QUALITATIVE ([facility] ONLY)    TREATMENT  Aspirin, Plavix  MDM  On reevaluation patient while the patient was in MRI patient had significant improvement of left-sided deficits.  Given significant improvement of his left-sided deficits and slurring of speech after shared decision making with Dr. Leonel Ramsay, MRI without signs of acute CVA, did not recommend TNK.  Recommended loading with Plavix.  Patient was given aspirin and Plavix load.  Consulted hospitalist for admission.   PROCEDURES:  Critical Care performed: yes  .Critical Care  Performed by: Nathaniel Man, MD Authorized by: Nathaniel Man, MD   Critical care provider statement:    Critical care time (minutes):  45   Critical care time was exclusive of:  Separately billable procedures and treating other patients   Critical care was necessary to treat or prevent imminent or life-threatening deterioration of the following conditions:  CNS failure or compromise   Critical care was time spent personally by me on the following activities:  Development of treatment plan with patient or surrogate, discussions with consultants, evaluation of patient's response to treatment, examination of patient, ordering and review of laboratory studies, ordering and review of radiographic studies, ordering and performing treatments and interventions, pulse oximetry, re-evaluation of patient's condition and review of old charts   Patient's presentation is most consistent with acute presentation with potential threat to life or bodily  function.   MEDICATIONS ORDERED IN ED: Medications  aspirin chewable tablet 324 mg (has no administration in time range)  iohexol (OMNIPAQUE) 350 MG/ML injection 75 mL (75 mLs Intravenous Contrast Given 01/11/23 1004)  clopidogrel (PLAVIX) tablet 300 mg (300 mg Oral Given 01/11/23 1117)    FINAL CLINICAL IMPRESSION(S) / ED DIAGNOSES   Final diagnoses:  Cerebrovascular accident (CVA), unspecified mechanism ([place])     Rx / DC Orders   ED Discharge Orders     None        Note:  This document was prepared using Dragon voice recognition software and may include unintentional dictation errors.   Nathaniel Man, MD 01/11/23 1124

## 2023-01-11 NOTE — H&P (Signed)
History and Physical    Patient: Gregory Humphrey [id number] DOB: 01/16/1941 DOA: 01/11/2023 DOS: the patient was seen and examined on 01/11/2023 PCP: Kirk Ruths, MD  Patient coming from:  [facility]   Chief Complaint:  Chief Complaint  Patient presents with   Code Stroke   HPI: Gregory Humphrey is a 82 y.o. male with medical history significant of type II by diabetes, hypertension, stroke, seizure disorder, history of subdural hematoma presenting with code stroke.  History from the patient as well as wife.  Per report, family when discharged from patient developed sudden onset of left-sided weakness, slurred speech.  Patient was unable to get out of the car.  Symptoms resolved and the family went to discharge.  Approximately 45 minutes later, symptoms returned with persistence.  EMS was subsequently called and patient brought to the ER for further evaluation.  Patient reports remote history of similar symptoms associated with subdural hematoma approximately 12 years ago.  Has had persistent slowed speech since then.  Does take a baby aspirin daily.  Denies any chest pain, shortness of breath, nausea or vomiting.  No abdominal pain. Presented to the ER afebrile, hemodynamically stable.  Code stroke was called with grossly negative imaging including CTA of the head and neck, CT head as well as MRI of the brain.  Symptoms progressively resolved while in the ER with patient no longer being a candidate for tPA.  Labs notable for white count of 3.8, hemoglobin 14.5, platelets of 129.  Glucose 187.  UDS pan negative. Review of Systems: As mentioned in the history of present illness. All other systems reviewed and are negative. Past Medical History:  Diagnosis Date   Diabetes mellitus without complication ([place])    Gilbert's syndrome    Hypertension    Kidney stones    Olecranon bursitis    Seizures ([place])    Stroke ([place])    Subdural hematoma (HCC)    Past Surgical History:  Procedure  Laterality Date   CARDIAC CATHETERIZATION N/A 12/05/2016   Procedure: Left Heart Cath and Coronary Angiography;  Surgeon: Teodoro Spray, MD;  Location: [place] [facility];  Service: Cardiovascular;  Laterality: N/A;   CARDIAC CATHETERIZATION N/A 12/05/2016   Procedure: Coronary Stent Intervention;  Surgeon: Wellington Hampshire, MD;  Location: [place] [facility];  Service: Cardiovascular;  Laterality: N/A;   CRANIOTOMY  10/08/2011   Procedure: CRANIOTOMY HEMATOMA EVACUATION SUBDURAL;  Surgeon: Peggyann Shoals, MD;  Location: [place] [facility];  Service: Neurosurgery;  Laterality: Left;  Left Craniotomy for subdural   PEG PLACEMENT  10/16/2011   Procedure: PERCUTANEOUS ENDOSCOPIC GASTROSTOMY (PEG) PLACEMENT;  Surgeon: Zenovia Jarred, MD;  Location: [place];  Service: Gastroenterology;  Laterality: N/A;   REFRACTIVE SURGERY  11/10/1993   Social History:  reports that he has never smoked. He has never used smokeless tobacco. He reports that he does not drink alcohol and does not use drugs.  No Known Allergies  Family History  Problem Relation Age of Onset   Stroke Mother    Hypertension Mother    Diabetes Mother    Cancer Father        bone   Alcohol abuse Brother        past   Diabetes Maternal Uncle    Stroke Maternal Grandfather    Prostate cancer Neg Hx    Bladder Cancer Neg Hx    Kidney cancer Neg Hx     Prior to Admission medications   Medication Sig  Start Date End Date Taking? Authorizing Provider  acetaminophen (TYLENOL) 500 MG tablet Take 500-1,000 mg by mouth See admin instructions. Take 2 tablets ('1000mg'$ ) by mouth every morning and take 1 tablet ('500mg'$ ) by mouth every night   Yes [provider]  aspirin EC 81 MG EC tablet Take 1 tablet (81 mg total) by mouth daily. 12/06/16  Yes Demetrios Loll, MD  cholecalciferol (VITAMIN D3) 25 MCG (1000 UNIT) tablet Take 1,000 Units by mouth daily with lunch.   Yes [provider]  hydrALAZINE (APRESOLINE) 10 MG tablet Take 20  mg by mouth 3 (three) times daily with meals.   Yes [provider]  isosorbide mononitrate (IMDUR) 30 MG 24 hr tablet Take 15 mg by mouth daily with breakfast.   Yes [provider]  lamoTRIgine (LAMICTAL) 25 MG tablet Take 75 mg by mouth daily with breakfast.   Yes [provider]  lisinopril (PRINIVIL,ZESTRIL) 40 MG tablet Take 40 mg by mouth daily.   Yes [provider]  metFORMIN (GLUCOPHAGE-XR) 500 MG 24 hr tablet Take 500 mg by mouth daily with supper.   Yes [provider]  metoprolol succinate (TOPROL-XL) 50 MG 24 hr tablet Take 50 mg by mouth daily with breakfast.   Yes [provider]  Multiple Vitamin (MULTIVITAMIN) tablet Take 1 tablet by mouth daily with lunch.   Yes [provider]  rosuvastatin (CRESTOR) 10 MG tablet Take 10 mg by mouth daily with supper.   Yes [provider]  vitamin C (ASCORBIC ACID) 500 MG tablet Take 500 mg by mouth daily with lunch.   Yes [provider]    Physical Exam: Vitals:   01/11/23 1109 01/11/23 1130 01/11/23 1200 01/11/23 1230  BP:  (!) 156/84 (!) 142/81 138/86  Pulse:  (!) 55 (!) 55 (!) 49  Resp:  '14 16 12  '$ Temp:      TempSrc:      SpO2:  95% 91% 94%  Weight: 86 kg     Height: '5\' 10"'$  (1.778 m)      Physical Exam Constitutional:      General: He is not in acute distress.    Appearance: He is normal weight.  HENT:     Head: Normocephalic and atraumatic.     Nose: Nose normal.     Mouth/Throat:     Mouth: Mucous membranes are moist.  Eyes:     Pupils: Pupils are equal, round, and reactive to light.  Cardiovascular:     Rate and Rhythm: Normal rate and regular rhythm.     Pulses: Normal pulses.  Pulmonary:     Effort: Pulmonary effort is normal.     Breath sounds: Normal breath sounds.  Abdominal:     General: Bowel sounds are normal.  Musculoskeletal:        General: Normal range of motion.     Cervical back: Normal range of motion.  Skin:     General: Skin is warm.  Neurological:     General: No focal deficit present.     Comments: Positive slowed speech otherwise grossly normal exam  Psychiatric:        Mood and Affect: Mood normal.     Data Reviewed:  There are no new results to review at this time. MR BRAIN WO CONTRAST CLINICAL DATA:  Left-sided weakness, stroke workup  EXAM: MRI HEAD WITHOUT CONTRAST  TECHNIQUE: Multiplanar, multiecho pulse sequences of the brain and surrounding structures were obtained without intravenous contrast.  COMPARISON:  Head CT from earlier today  FINDINGS: Brain: No acute infarction, hemorrhage, hydrocephalus, or mass lesion.  Beneath the bone flap is a T2 hyperintense collection with hemosiderin staining peripherally, consistent with chronic postoperative epidural collection, also seen on 2013 head CT.  Chronic small vessel ischemic gliosis in the cerebral white matter is extensive. Chronic lacunes in the deep white matter with wallerian changes crossing the corpus callosum. Moderate chronic small vessel ischemia in the pons.  Left inferior frontal and inferior temporal encephalomalacia, often posttraumatic with this pattern. Chronic hemosiderin in the parasagittal anterior right frontal lobe from uncertain insult.  Vascular: Loss of upper right jugular flow void is likely from extrinsic compression in the neck. A T2 weighted imaging there is a crescentic appearance at the posterior right ICA at the skull base, no dissection findings on prior CTA.  Skull and upper cervical spine: Normal marrow signal.  Sinuses/Orbits: Negative  IMPRESSION: 1. No acute finding.  Negative for infarct. 2. Advanced chronic small vessel disease. 3. Prior left craniotomy with chronic epidural collection beneath the bone flap. Left inferior frontal and temporal encephalomalacia.  Electronically Signed   By: Jorje Guild M.D.   On: 01/11/2023 10:55 CT ANGIO HEAD NECK W WO CM (CODE  STROKE) CLINICAL DATA:  Stroke workup  EXAM: CT ANGIOGRAPHY HEAD AND NECK  TECHNIQUE: Multidetector CT imaging of the head and neck was performed using the standard protocol during bolus administration of intravenous contrast. Multiplanar CT image reconstructions and MIPs were obtained to evaluate the vascular anatomy. Carotid stenosis measurements (when applicable) are obtained utilizing NASCET criteria, using the distal internal carotid diameter as the denominator.  RADIATION DOSE REDUCTION: This exam was performed according to the departmental dose-optimization program which includes automated exposure control, adjustment of the mA and/or kV according to patient size and/or use of iterative reconstruction technique.  CONTRAST:  29m OMNIPAQUE IOHEXOL 350 MG/ML SOLN  COMPARISON:  Head CT 01/05/2012  FINDINGS: CTA NECK FINDINGS  Aortic arch: Atheromatous plaque with 3 vessel branching.  Right carotid system: Atheromatous calcification at the carotid bifurcation. No stenosis or ulceration  Left carotid system: Atheromatous plaque at the bifurcation. No stenosis or ulceration.  Vertebral arteries: No proximal subclavian stenosis. Strong left vertebral artery dominance. The vertebral arteries are smoothly contoured and widely patent to the dura.  Skeleton: No emergent finding.  Other neck: No acute or aggressive finding.  Upper chest: Negative  Review of the MIP images confirms the above findings  CTA HEAD FINDINGS  Anterior circulation: Atheromatous calcification along the carotid siphons. No branch occlusion, beading, or aneurysm  Posterior circulation: Strong left vertebral artery dominance with right PICA being the termination of the right vertebral artery. The basilar is smoothly contoured and diffusely patent. No branch occlusion, beading, or aneurysm.  Venous sinuses: Unremarkable  Anatomic variants: None significant  Review of the MIP images confirms  the above findings  IMPRESSION: No emergent vascular finding. Ordinary atheromatous change for age without significant stenosis or irregularity of major arteries in the head and neck.  Electronically Signed   By: JJorje GuildM.D.   On: 01/11/2023 10:21 CT HEAD CODE STROKE WO CONTRAST CLINICAL DATA:  Code stroke. Slurred speech and left-sided weakness.  EXAM: CT HEAD WITHOUT CONTRAST  TECHNIQUE: Contiguous axial images were obtained from the base of the skull through the vertex without intravenous contrast.  RADIATION DOSE REDUCTION: This exam was performed according to the departmental dose-optimization program which includes automated exposure control, adjustment of the mA and/or kV according  to patient size and/or use of iterative reconstruction technique.  COMPARISON:  01/05/2012  FINDINGS: Brain: Linear high-density beneath a chronic epidural collection beneath the bone flap, unchanged. Beyond the postoperative region, no acute hemorrhage.  Left inferior temporal and frontal encephalomalacia, usually posttraumatic with this pattern. No acute infarct is seen. Chronic appearing lacune at the left internal capsule and right thalamus.  No hydrocephalus or masslike finding.  Vascular: No hyperdense vessel or unexpected calcification.  Skull: Remote left craniotomy  Sinuses/Orbits: Negative  Other: These results were called by telephone at the time of interpretation on 01/11/2023 at 10:04 am to provider Dr. Leonel Ramsay., Who verbally acknowledged these results.  ASPECTS [facility] Stroke Program Early CT Score)  - Ganglionic level infarction (caudate, lentiform nuclei, internal capsule, insula, M1-M3 cortex): 7  - Supraganglionic infarction (M4-M6 cortex): 3  Total score (0-10 with 10 being normal): 10  IMPRESSION: 1. No acute finding. 2. Remote left craniotomy with chronic epidural collection beneath the bone flap. The high-density does not continue 3. Left  inferior frontal and temporal encephalomalacia, usually posttraumatic with this pattern  Electronically Signed   By: Jorje Guild M.D.   On: 01/11/2023 10:06   Lab Results  Component Value Date   WBC 3.8 (L) 01/11/2023   HGB 14.5 01/11/2023   HCT 41.7 01/11/2023   MCV 91.9 01/11/2023   PLT 129 (L) [id number]   Last metabolic panel Lab Results  Component Value Date   GLUCOSE 187 (H) 01/11/2023   NA 135 01/11/2023   K 3.7 01/11/2023   CL 102 01/11/2023   CO2 25 01/11/2023   BUN 19 01/11/2023   CREATININE 1.01 01/11/2023   GFRNONAA >60 01/11/2023   CALCIUM 9.3 01/11/2023   PHOS 3.2 11/16/2011   PROT 6.6 01/11/2023   ALBUMIN 3.9 01/11/2023   BILITOT 1.2 01/11/2023   ALKPHOS 37 (L) 01/11/2023   AST 28 01/11/2023   ALT 36 01/11/2023   ANIONGAP 8 01/11/2023    Assessment and Plan: * TIA (transient ischemic attack) Noted transient left-sided weakness that started around 9 AM today that initially resolved and then recurred prior to arrival to ER. Had L sided weakness that progressively resolved in process of code stroke evaluation.  Not felt to be a tenecteplase candidate given resolution of symptoms Imaging in the ER including CT of the head neck, CT head as well as MRI of the brain grossly negative for any acute findings Case formally evaluated by neurologist Dr. Leonel Ramsay Agree for formal CVA eval including restratification labs and 2D echo Started on dual antiplatelet therapy with aspirin and Plavix status post Plavix load (already on baby aspirin prior to admission) Tele monitor Serial neuro checks  Follow up further neuro recommendations.      Coronary artery disease No active chest pain EKG normal sinus rhythm Follow  Hyperglycemia Sliding-scale insulin A1c  ICH (intracerebral hemorrhage) (HCC) Status post intracranial hemorrhage from 2012 Chronic slurred speech and appears to be at baseline Follow  Essential hypertension, benign BP 130s to 180s  over 80s to 90s Allow for permissive hypertension in the setting of TIA eval As needed IV labetalol for systolic pressure greater than [id number] or diastolic pressure greater than 210 Follow  HYPERCHOLESTEROLEMIA Lipid profile pending  Cont statin        Advance Care Planning:   Code Status: Prior   Consults: Neurology-Dr. Leonel Ramsay   Family Communication: Wife Rachel at the bedside   Severity of Illness: The appropriate patient status for this patient is OBSERVATION. Observation  status is judged to be reasonable and necessary in order to provide the required intensity of service to ensure the patient's safety. The patient's presenting symptoms, physical exam findings, and initial radiographic and laboratory data in the context of their medical condition is felt to place them at decreased risk for further clinical deterioration. Furthermore, it is anticipated that the patient will be medically stable for discharge from the hospital within 2 midnights of admission.   Author: Deneise Lever, MD 01/11/2023 1:17 PM  For on call review [email or web address].

## 2023-01-11 NOTE — ED Notes (Signed)
Advised nurse that patient has  a dirty bed

## 2023-01-11 NOTE — Consult Note (Addendum)
Code stroke activated at [facility] in [place].   Dr Leonel Ramsay in [place] with pt after internal page at [phone].  Back to room from CT at 1009. Dr Leonel Ramsay speaking with pt and family at this time. mRS 0.  Off call at 1019.

## 2023-01-11 NOTE — ED Notes (Signed)
Pt taken to MRI at this time

## 2023-01-11 NOTE — Consult Note (Signed)
CODE STROKE- PHARMACY COMMUNICATION   Time CODE STROKE called/page received:[id number]   Time response to CODE STROKE was made (in person or via phone):   Time Stroke Kit retrieved from [facility] (only if needed):Not given. Patient started to improve and MRI was negative.   Name of Provider/Nurse contacted:[place].   Past Medical History:  Diagnosis Date   Diabetes mellitus without complication ([place])    Gilbert's syndrome    Hypertension    Kidney stones    Olecranon bursitis    Seizures ([place])    Stroke ([place])    Subdural hematoma (HCC)    Prior to Admission medications   Medication Sig Start Date End Date Taking? Authorizing Provider  acetaminophen (TYLENOL) 500 MG tablet Take 500-1,000 mg by mouth See admin instructions. Take 2 tablets ('1000mg'$ ) by mouth every morning and take 1 tablet ('500mg'$ ) by mouth every night   Yes [provider]  aspirin EC 81 MG EC tablet Take 1 tablet (81 mg total) by mouth daily. 12/06/16  Yes Demetrios Loll, MD  cholecalciferol (VITAMIN D3) 25 MCG (1000 UNIT) tablet Take 1,000 Units by mouth daily with lunch.   Yes [provider]  hydrALAZINE (APRESOLINE) 10 MG tablet Take 20 mg by mouth 3 (three) times daily with meals.   Yes [provider]  isosorbide mononitrate (IMDUR) 30 MG 24 hr tablet Take 15 mg by mouth daily with breakfast.   Yes [provider]  lamoTRIgine (LAMICTAL) 25 MG tablet Take 75 mg by mouth daily with breakfast.   Yes [provider]  lisinopril (PRINIVIL,ZESTRIL) 40 MG tablet Take 40 mg by mouth daily.   Yes [provider]  metFORMIN (GLUCOPHAGE-XR) 500 MG 24 hr tablet Take 500 mg by mouth daily with supper.   Yes [provider]  metoprolol succinate (TOPROL-XL) 50 MG 24 hr tablet Take 50 mg by mouth daily with breakfast.   Yes [provider]  Multiple Vitamin (MULTIVITAMIN) tablet Take 1 tablet by mouth daily with lunch.   Yes [provider]  rosuvastatin  (CRESTOR) 10 MG tablet Take 10 mg by mouth daily with supper.   Yes [provider]  vitamin C (ASCORBIC ACID) 500 MG tablet Take 500 mg by mouth daily with lunch.   Yes [provider]  benzonatate (TESSALON) 100 MG capsule Take 1 capsule (100 mg total) by mouth every 8 (eight) hours. 08/09/21 01/11/23  Boddu, Erasmo Downer, FNP  cephALEXin (KEFLEX) 500 MG capsule Take 1 capsule (500 mg total) by mouth 4 (four) times daily. 03/10/22 01/11/23  Sharion Balloon, NP  clopidogrel (PLAVIX) 75 MG tablet Take 1 tablet (75 mg total) by mouth daily with breakfast. 12/07/16 01/11/23  Demetrios Loll, MD  fluticasone [place]) 50 MCG/ACT nasal spray Place 2 sprays into both nostrils daily. 08/09/21 01/11/23  Serafina Royals, FNP  metFORMIN (GLUCOPHAGE) 500 MG tablet Take 500 mg by mouth 2 (two) times daily. 04/08/16 01/11/23  [provider]    Oswald Hillock ,PharmD Clinical Pharmacist  01/11/2023  1:22 PM

## 2023-01-11 NOTE — Assessment & Plan Note (Signed)
Stable Continue Lamictal

## 2023-01-11 NOTE — Assessment & Plan Note (Signed)
BP 130s to 180s over 80s to 90s Allow for permissive hypertension in the setting of TIA eval As needed IV labetalol for systolic pressure greater than [id number] or diastolic pressure greater than 210 Follow

## 2023-01-11 NOTE — Assessment & Plan Note (Signed)
Sliding-scale insulin A1c

## 2023-01-11 NOTE — ED Triage Notes (Signed)
Pt arrives via EMS at 948 with stroke like symptoms. EMS encoded that stroke screen was negative and wife had noticed slurred speech, left arm weakness and facial droop. EMS then updated with additional encode saying pt now unable to grip with left hand. EMS states they did not activate code stroke because they were close. On arrival to ER, pt has notable facial droop, unable to lift left arm or leg and code stroke called. Pt taken straight to CT with RN and EDP.

## 2023-01-11 NOTE — Progress Notes (Signed)
OT Cancellation Note  Patient Details Name: Gregory Humphrey MRN: [id number] DOB: 11-Aug-1941   Cancelled Treatment:    Reason Eval/Treat Not Completed: OT screened, no needs identified, will sign off. OT orders received, chart reviewed. Pt reports all symptoms have resolved. Pt reports completing functional mobility to the bathroom and toileting routine independently today. Denies any ADL/IADL concerns. No skilled OT needs indicated at this time. Please re-consult if there are any acute changes.    Lanelle Bal  [facility] 01/11/2023, 5:18 PM

## 2023-01-11 NOTE — ED Notes (Signed)
Pt symptoms resolved on way to MRI. Neurologist at bedside and made aware

## 2023-01-12 ENCOUNTER — Encounter: Payer: Self-pay | Admitting: Family Medicine

## 2023-01-12 DIAGNOSIS — G459 Transient cerebral ischemic attack, unspecified: Secondary | ICD-10-CM | POA: Diagnosis not present

## 2023-01-12 LAB — BASIC METABOLIC PANEL
Anion gap: 8 (ref 5–15)
BUN: 20 mg/dL (ref 8–23)
CO2: 23 mmol/L (ref 22–32)
Calcium: 9.3 mg/dL (ref 8.9–10.3)
Chloride: 106 mmol/L (ref 98–111)
Creatinine, Ser: 1.04 mg/dL (ref 0.61–1.24)
GFR, Estimated: >60 mL/min (ref >60)
Glucose, Bld: 148 mg/dL — ABNORMAL HIGH (ref 70–99)
Potassium: 3.8 mmol/L (ref 3.5–5.1)
Sodium: 137 mmol/L (ref 135–145)

## 2023-01-12 LAB — CBC WITH DIFFERENTIAL/PLATELET
Abs Immature Granulocytes: 0.01 10*3/uL (ref 0.00–0.07)
Basophils Absolute: 0.1 10*3/uL (ref 0.0–0.1)
Basophils Relative: 1 %
Eosinophils Absolute: 0.1 10*3/uL (ref 0.0–0.5)
Eosinophils Relative: 2 %
HCT: 44.6 % (ref 39.0–52.0)
Hemoglobin: 15.7 g/dL (ref 13.0–17.0)
Immature Granulocytes: 0 %
Lymphocytes Relative: 26 %
Lymphs Abs: 1.2 10*3/uL (ref 0.7–4.0)
MCH: 32.3 pg (ref 26.0–34.0)
MCHC: 35.2 g/dL (ref 30.0–36.0)
MCV: 91.8 fL (ref 80.0–100.0)
Monocytes Absolute: 0.5 10*3/uL (ref 0.1–1.0)
Monocytes Relative: 10 %
Neutro Abs: 2.9 10*3/uL (ref 1.7–7.7)
Neutrophils Relative %: 61 %
Platelets: 145 10*3/uL — ABNORMAL LOW (ref 150–400)
RBC: 4.86 MIL/uL (ref 4.22–5.81)
RDW: 13.4 % (ref 11.5–15.5)
WBC: 4.7 10*3/uL (ref 4.0–10.5)
nRBC: 0 % (ref 0.0–0.2)

## 2023-01-12 LAB — GLUCOSE, CAPILLARY
Glucose-Capillary: 188 mg/dL — ABNORMAL HIGH (ref 70–99)
Glucose-Capillary: 139 mg/dL — ABNORMAL HIGH (ref 70–99)

## 2023-01-12 LAB — LIPID PANEL
Cholesterol: 95 mg/dL (ref 0–200)
HDL: 31 mg/dL — ABNORMAL LOW (ref >40)
LDL Cholesterol: 10 mg/dL (ref 0–99)
Total CHOL/HDL Ratio: 3.1 RATIO
Triglycerides: 270 mg/dL — ABNORMAL HIGH (ref <150)
VLDL: 54 mg/dL — ABNORMAL HIGH (ref 0–40)

## 2023-01-12 MED ORDER — CLOPIDOGREL BISULFATE 75 MG PO TABS: 75.0000 mg | ORAL_TABLET | Freq: Every day | ORAL | 0 refills | Status: AC

## 2023-01-12 NOTE — Assessment & Plan Note (Signed)
-  Stable -Continue Lamictal

## 2023-01-12 NOTE — Assessment & Plan Note (Addendum)
-  Patient presented with stuttering symptoms strongly suggestive of TIA -Imaging including CTA head/neck, CT head, and MRI are grossly negative for any acute findings -Patient was observed overnight and had no further symptoms after initiation of Plavix -Will dc to home -Needs DAPT x 3 weeks and then transition to Plavix (family request) -Outpatient neurology f/u recommended

## 2023-01-12 NOTE — Assessment & Plan Note (Signed)
-  Status post intracranial hemorrhage from 2012 -Extensive hospitalization with craniotomy -No further hemorrhage appreciated on imaging during this hospitalization

## 2023-01-12 NOTE — Discharge Summary (Signed)
Physician Discharge Summary   Patient: Gregory Humphrey MRN: [id number] DOB: 1941/05/04  Admit date:     01/11/2023  Discharge date: 01/12/23  Discharge Physician: Karmen Bongo   PCP: Kirk Ruths, MD   Recommendations at discharge:   Follow up with PCP within 1-2 weeks to discuss hypertriglyceridemia Follow up with neurology as recommended [facility] preferred, [phone]  Take aspirin + Plavix x 3 weeks and then transition back to aspirin daily (but discuss with neurologist as an outpatient to decide if Plavix is a better choice) Follow up for outpatient physical therapy, as discussed  Discharge Diagnoses: Principal Problem:   TIA (transient ischemic attack) Active Problems:   HYPERCHOLESTEROLEMIA   Essential hypertension, benign   History of subdural hematoma   Type 2 diabetes mellitus (HCC)   Coronary artery disease   Seizure disorder [facility])    Hospital Course: Patient with h/o DM, HTN, CVA, seizure d/o, and prior SDH s/p craniotomy presenting with acute onset of L-sided weakness and slurred speech.  Symptoms resolved and then recurred.  Code stroke was called but not a candidate for TNK. Neurology consulted and recommended TIA evaluation with overnight observation.  Assessment and Plan: * TIA (transient ischemic attack) -Patient presented with stuttering symptoms strongly suggestive of TIA -Imaging including CTA head/neck, CT head, and MRI are grossly negative for any acute findings -Patient was observed overnight and had no further symptoms after initiation of Plavix -Will dc to home -Needs DAPT x 3 weeks and then transition to Plavix (family request) -Outpatient neurology f/u recommended  Seizure disorder ([place]) -Stable -Continue Lamictal  Coronary artery disease -No active chest pain -No concerns on EKG on presentation -Continue Imdur  Type 2 diabetes mellitus ([place]) -A1c is in process, needs PCP f/u -Continue Metformin -Suggest outpatient  PCP f/u within 1-2 weeks  History of subdural hematoma -Status post intracranial hemorrhage from 2012 -Extensive hospitalization with craniotomy -No further hemorrhage appreciated on imaging during this hospitalization  Essential hypertension, benign -Resume home meds (Toprol XL, lisinopril, hydralazine)  HYPERCHOLESTEROLEMIA -LDL is 10(!) -He does have elevated triglycerides and may benefit from further therapy -Continue rosuvastatin for now -F/u with PCP      Consultants: Neurology; PT, TOC team Procedures performed: CT, CTA, MRI, Echo  Disposition: Home Diet recommendation:  Cardiac and Carb modified diet DISCHARGE MEDICATION: Allergies as of 01/12/2023   No Known Allergies      Medication List     TAKE these medications    acetaminophen 500 MG tablet Commonly known as: TYLENOL Take 500-1,000 mg by mouth See admin instructions. Take 2 tablets ('1000mg'$ ) by mouth every morning and take 1 tablet ('500mg'$ ) by mouth every night   ascorbic acid 500 MG tablet Commonly known as: VITAMIN C Take 500 mg by mouth daily with lunch.   aspirin EC 81 MG tablet Take 1 tablet (81 mg total) by mouth daily.   cholecalciferol 25 MCG (1000 UNIT) tablet Commonly known as: VITAMIN D3 Take 1,000 Units by mouth daily with lunch.   clopidogrel 75 MG tablet Commonly known as: Plavix Take 1 tablet (75 mg total) by mouth daily for 21 days.   hydrALAZINE 10 MG tablet Commonly known as: APRESOLINE Take 20 mg by mouth 3 (three) times daily with meals.   isosorbide mononitrate 30 MG 24 hr tablet Commonly known as: IMDUR Take 15 mg by mouth daily with breakfast.   lamoTRIgine 25 MG tablet Commonly known as: LAMICTAL Take 75 mg by mouth daily with breakfast.  lisinopril 40 MG tablet Commonly known as: ZESTRIL Take 40 mg by mouth daily.   metFORMIN 500 MG 24 hr tablet Commonly known as: GLUCOPHAGE-XR Take 500 mg by mouth daily with supper.   metoprolol succinate 50 MG 24 hr  tablet Commonly known as: TOPROL-XL Take 50 mg by mouth daily with breakfast.   multivitamin tablet Take 1 tablet by mouth daily with lunch.   rosuvastatin 10 MG tablet Commonly known as: CRESTOR Take 10 mg by mouth daily with supper.        Follow-up Information     Kirk Ruths, MD Follow up.   Specialty: Internal Medicine Contact information: [address] [place] [address] [phone]                Discharge Exam: [name] Weights   01/11/23 1109  Weight: 86 kg   Subjective: Patient and wife are at the bedside and described his stuttering TIA symptoms yesterday that completely resolved with Plavix.  He has no further symptoms and feels well enough to go home.     Physical Exam:       Vitals:    01/11/23 2022 01/12/23 0023 01/12/23 0534 01/12/23 0852  BP: (!) 165/90 (!) 158/89 (!) 179/74 (!) 171/82  Pulse: 60 62 62 63  Resp: '16 16 16 20  '$ Temp: 98 F (36.7 C) 98.5 F (36.9 C) 98.1 F (36.7 C) 98.8 F (37.1 C)  TempSrc: Oral Oral Oral    SpO2: 96% 96% 97% 97%  Weight:          Height:            General:  Appears calm and comfortable and is in NAD Eyes:   EOMI, normal lids, iris ENT:  grossly normal hearing, lips & tongue, mmm Neck:  no LAD, masses or thyromegaly Cardiovascular:  RRR, no m/r/g. No LE edema.  Respiratory:   CTA bilaterally with no wheezes/rales/rhonchi.  Normal respiratory effort. Abdomen:  soft, NT, ND Skin:  no rash or induration seen on limited exam Musculoskeletal:  grossly normal tone BUE/BLE, good ROM, no bony abnormality Psychiatric:  grossly normal mood and affect, speech fluent and appropriate, AOx3 Neurologic:  CN 2-12 grossly intact, moves all extremities in coordinated fashion, sensation intact     EKG: not done     Labs on Admission: I have personally reviewed the available labs and imaging studies at the time of the admission.   Pertinent labs:     Glucose 187 Lipids:  95/31/10/270 WBC 3.8 Platelets 129  Condition at discharge: stable  The results of significant diagnostics from this hospitalization (including imaging, microbiology, ancillary and laboratory) are listed below for reference.   Imaging Studies: ECHOCARDIOGRAM COMPLETE  Result Date: 01/11/2023    ECHOCARDIOGRAM REPORT   Patient Name:   MAXIMILLIAN SHINDLE [facility] Date of Exam: 01/11/2023 Medical Rec #:  [id number]        Height:       70.0 in Accession #:    [id number]       Weight:       189.6 lb Date of Birth:  05-03-1941       BSA:          2.040 m Patient Age:    82 years         BP:           138/88 mmHg Patient Gender: M  HR:           52 bpm. Exam Location:  [facility] Procedure: 2D Echo Indications:     TIA G45.9  History:         Patient has no prior history of Echocardiogram examinations.  Sonographer:     Wayland Salinas Referring Phys:  [place] Diagnosing Phys: Dorris Carnes MD  Sonographer Comments: Suboptimal subcostal window. IMPRESSIONS  1. Left ventricular ejection fraction, by estimation, is 60 to 65%. The left ventricle has normal function. The left ventricle has no regional wall motion abnormalities. There is mild left ventricular hypertrophy. Left ventricular diastolic parameters are indeterminate.  2. Right ventricular systolic function is normal. The right ventricular size is normal.  3. The mitral valve is normal in structure. Mild mitral valve regurgitation.  4. The aortic valve is normal in structure. Aortic valve regurgitation is mild to moderate. FINDINGS  Left Ventricle: Left ventricular ejection fraction, by estimation, is 60 to 65%. The left ventricle has normal function. The left ventricle has no regional wall motion abnormalities. The left ventricular internal cavity size was normal in size. There is  mild left ventricular hypertrophy. Left ventricular diastolic parameters are indeterminate. Right Ventricle: The right ventricular size is normal. Right vetricular wall  thickness was not assessed. Right ventricular systolic function is normal. Left Atrium: Left atrial size was normal in size. Right Atrium: Right atrial size was normal in size. Pericardium: There is no evidence of pericardial effusion. Mitral Valve: The mitral valve is normal in structure. Mild mitral valve regurgitation. Tricuspid Valve: The tricuspid valve is normal in structure. Tricuspid valve regurgitation is mild. Aortic Valve: The aortic valve is normal in structure. Aortic valve regurgitation is mild to moderate. Aortic regurgitation PHT measures 708 msec. Aortic valve peak gradient measures 4.5 mmHg. Pulmonic Valve: The pulmonic valve was normal in structure. Pulmonic valve regurgitation is mild. Aorta: The aortic root is normal in size and structure. IAS/Shunts: No atrial level shunt detected by color flow Doppler.  LEFT VENTRICLE PLAX 2D LVIDd:         4.20 cm   Diastology LVIDs:         2.80 cm   LV e' medial:    4.40 cm/s LV PW:         1.20 cm   LV E/e' medial:  12.9 LV IVS:        1.30 cm   LV e' lateral:   8.00 cm/s LVOT diam:     2.10 cm   LV E/e' lateral: 7.1 LV SV:         77 LV SV Index:   38 LVOT Area:     3.46 cm  RIGHT VENTRICLE RV S prime:     12.30 cm/s TAPSE (M-mode): 2.8 cm LEFT ATRIUM             Index        RIGHT ATRIUM           Index LA diam:        4.40 cm 2.16 cm/m   RA Area:     13.10 cm LA Vol (A2C):   88.8 ml 43.52 ml/m  RA Volume:   28.90 ml  14.16 ml/m LA Vol (A4C):   60.6 ml 29.70 ml/m LA Biplane Vol: 75.7 ml 37.10 ml/m  AORTIC VALVE                 PULMONIC VALVE AV Area (Vmax): 2.88 cm  PR End Diast Vel: 3.44 msec AV Vmax:        106.00 cm/s  RVOT Peak grad:   1 mmHg AV Peak Grad:   4.5 mmHg LVOT Vmax:      88.05 cm/s LVOT Vmean:     56.050 cm/s LVOT VTI:       0.222 m AI PHT:         708 msec  AORTA Ao Root diam: 3.40 cm Ao Asc diam:  3.80 cm MITRAL VALVE               TRICUSPID VALVE MV Area (PHT): 2.66 cm    TR Peak grad:   16.8 mmHg MV Decel Time: 285 msec     TR Vmax:        205.00 cm/s MV E velocity: 56.90 cm/s MV A velocity: 70.55 cm/s  SHUNTS MV E/A ratio:  0.81        Systemic VTI:  0.22 m                            Systemic Diam: 2.10 cm Dorris Carnes MD Electronically signed by Dorris Carnes MD Signature Date/Time: 01/11/2023/3:07:16 PM    Final    MR BRAIN WO CONTRAST  Result Date: 01/11/2023 CLINICAL DATA:  Left-sided weakness, stroke workup EXAM: MRI HEAD WITHOUT CONTRAST TECHNIQUE: Multiplanar, multiecho pulse sequences of the brain and surrounding structures were obtained without intravenous contrast. COMPARISON:  Head CT from earlier today FINDINGS: Brain: No acute infarction, hemorrhage, hydrocephalus, or mass lesion. Beneath the bone flap is a T2 hyperintense collection with hemosiderin staining peripherally, consistent with chronic postoperative epidural collection, also seen on 2013 head CT. Chronic small vessel ischemic gliosis in the cerebral white matter is extensive. Chronic lacunes in the deep white matter with wallerian changes crossing the corpus callosum. Moderate chronic small vessel ischemia in the pons. Left inferior frontal and inferior temporal encephalomalacia, often posttraumatic with this pattern. Chronic hemosiderin in the parasagittal anterior right frontal lobe from uncertain insult. Vascular: Loss of upper right jugular flow void is likely from extrinsic compression in the neck. A T2 weighted imaging there is a crescentic appearance at the posterior right ICA at the skull base, no dissection findings on prior CTA. Skull and upper cervical spine: Normal marrow signal. Sinuses/Orbits: Negative IMPRESSION: 1. No acute finding.  Negative for infarct. 2. Advanced chronic small vessel disease. 3. Prior left craniotomy with chronic epidural collection beneath the bone flap. Left inferior frontal and temporal encephalomalacia. Electronically Signed   By: Jorje Guild M.D.   On: 01/11/2023 10:55   CT ANGIO HEAD NECK W WO CM (CODE  STROKE)  Result Date: 01/11/2023 CLINICAL DATA:  Stroke workup EXAM: CT ANGIOGRAPHY HEAD AND NECK TECHNIQUE: Multidetector CT imaging of the head and neck was performed using the standard protocol during bolus administration of intravenous contrast. Multiplanar CT image reconstructions and MIPs were obtained to evaluate the vascular anatomy. Carotid stenosis measurements (when applicable) are obtained utilizing NASCET criteria, using the distal internal carotid diameter as the denominator. RADIATION DOSE REDUCTION: This exam was performed according to the departmental dose-optimization program which includes automated exposure control, adjustment of the mA and/or kV according to patient size and/or use of iterative reconstruction technique. CONTRAST:  73m OMNIPAQUE IOHEXOL 350 MG/ML SOLN COMPARISON:  Head CT 01/05/2012 FINDINGS: CTA NECK FINDINGS Aortic arch: Atheromatous plaque with 3 vessel branching. Right carotid system: Atheromatous calcification at the carotid bifurcation. No stenosis  or ulceration Left carotid system: Atheromatous plaque at the bifurcation. No stenosis or ulceration. Vertebral arteries: No proximal subclavian stenosis. Strong left vertebral artery dominance. The vertebral arteries are smoothly contoured and widely patent to the dura. Skeleton: No emergent finding. Other neck: No acute or aggressive finding. Upper chest: Negative Review of the MIP images confirms the above findings CTA HEAD FINDINGS Anterior circulation: Atheromatous calcification along the carotid siphons. No branch occlusion, beading, or aneurysm Posterior circulation: Strong left vertebral artery dominance with right PICA being the termination of the right vertebral artery. The basilar is smoothly contoured and diffusely patent. No branch occlusion, beading, or aneurysm. Venous sinuses: Unremarkable Anatomic variants: None significant Review of the MIP images confirms the above findings IMPRESSION: No emergent vascular  finding. Ordinary atheromatous change for age without significant stenosis or irregularity of major arteries in the head and neck. Electronically Signed   By: Jorje Guild M.D.   On: 01/11/2023 10:21   CT HEAD CODE STROKE WO CONTRAST  Result Date: 01/11/2023 CLINICAL DATA:  Code stroke. Slurred speech and left-sided weakness. EXAM: CT HEAD WITHOUT CONTRAST TECHNIQUE: Contiguous axial images were obtained from the base of the skull through the vertex without intravenous contrast. RADIATION DOSE REDUCTION: This exam was performed according to the departmental dose-optimization program which includes automated exposure control, adjustment of the mA and/or kV according to patient size and/or use of iterative reconstruction technique. COMPARISON:  01/05/2012 FINDINGS: Brain: Linear high-density beneath a chronic epidural collection beneath the bone flap, unchanged. Beyond the postoperative region, no acute hemorrhage. Left inferior temporal and frontal encephalomalacia, usually posttraumatic with this pattern. No acute infarct is seen. Chronic appearing lacune at the left internal capsule and right thalamus. No hydrocephalus or masslike finding. Vascular: No hyperdense vessel or unexpected calcification. Skull: Remote left craniotomy Sinuses/Orbits: Negative Other: These results were called by telephone at the time of interpretation on 01/11/2023 at 10:04 am to provider Dr. Leonel Ramsay., Who verbally acknowledged these results. ASPECTS [facility] Stroke Program Early CT Score) - Ganglionic level infarction (caudate, lentiform nuclei, internal capsule, insula, M1-M3 cortex): 7 - Supraganglionic infarction (M4-M6 cortex): 3 Total score (0-10 with 10 being normal): 10 IMPRESSION: 1. No acute finding. 2. Remote left craniotomy with chronic epidural collection beneath the bone flap. The high-density does not continue 3. Left inferior frontal and temporal encephalomalacia, usually posttraumatic with this pattern  Electronically Signed   By: Jorje Guild M.D.   On: 01/11/2023 10:06    Microbiology: Results for orders placed or performed during the hospital encounter of 08/09/21  Novel Coronavirus, NAA ([facility])     Status: None   Collection Time: 08/09/21  8:38 AM   Specimen: Nasopharyngeal Swab; Nasopharyngeal(NP) swabs in vial transport medium   Nasopharynge  Patient  Result Value Ref Range Status   SARS-CoV-2, NAA Not Detected Not Detected Final    Comment: This nucleic acid amplification test was developed and its performance characteristics determined by [employer]. Nucleic acid amplification tests include RT-PCR and TMA. This test has not been FDA cleared or approved. This test has been authorized by FDA under an Emergency Use Authorization (EUA). This test is only authorized for the duration of time the declaration that circumstances exist justifying the authorization of the emergency use of in vitro diagnostic tests for detection of SARS-CoV-2 virus and/or diagnosis of COVID-19 infection under section 564(b)(1) of the Act, 21 U.S.C. [id number]) (1), unless the authorization is terminated or revoked sooner. When diagnostic testing is negative, the possibility of a false  negative result should be considered in the context of a patient's recent exposures and the presence of clinical signs and symptoms consistent with COVID-19. An individual without symptoms of COVID-19 and who is not shedding SARS-CoV-2 virus wo uld expect to have a negative (not detected) result in this assay.   SARS-COV-2, NAA 2 DAY TAT     Status: None   Collection Time: 08/09/21  8:38 AM   Nasopharynge  Patient  Result Value Ref Range Status   SARS-CoV-2, NAA 2 DAY TAT Performed  Final    Discharge time spent: greater than 30 minutes.  Signed: Karmen Bongo, MD [facility] 01/12/2023

## 2023-01-12 NOTE — Assessment & Plan Note (Signed)
-  No active chest pain -No concerns on EKG on presentation -Continue Imdur

## 2023-01-12 NOTE — Plan of Care (Signed)

## 2023-01-12 NOTE — Assessment & Plan Note (Signed)
-  Resume home meds (Toprol XL, lisinopril, hydralazine)

## 2023-01-12 NOTE — Hospital Course (Signed)
Patient with h/o DM, HTN, CVA, seizure d/o, and prior SDH s/p craniotomy presenting with acute onset of L-sided weakness and slurred speech.  Symptoms resolved and then recurred.  Code stroke was called but not a candidate for TNK. Neurology consulted and recommended TIA evaluation with overnight observation.

## 2023-01-12 NOTE — Assessment & Plan Note (Signed)
-  LDL is 10(!) -He does have elevated triglycerides and may benefit from further therapy -Continue rosuvastatin for now -F/u with PCP

## 2023-01-12 NOTE — Progress Notes (Signed)
SLP Cancellation Note  Patient Details Name: GARRITT MANNY MRN: [id number] DOB: 1941-01-24   Cancelled treatment:       Reason Eval/Treat Not Completed: SLP screened, no needs identified, will sign off (chart reviewed; consulted NSG then met w/ pt)  Pt denied any difficulty swallowing and is currently on a regular diet; tolerates swallowing pills w/ water per NSG. Wife and pt stated he will not swallow as many Pills at one time to avoid "choking on them" - pt stated he "used to swallow 11 Pills at one time". Educated on general aspiration precautions.  Pt conversed in conversation w/out expressive/receptive deficits noted; pt denied any speech-language deficits. Speech clear. Wife stated no longer a "problem" as at admit. No further skilled ST services indicated as pt appears at his baseline. Pt agreed. NSG to reconsult if any change in status while admitted.      Orinda Kenner, MS, CCC-SLP Speech Language Pathologist Rehab Services; [place] [phone] (ascom) Julieanne Hadsall 01/12/2023, 11:38 AM

## 2023-01-12 NOTE — Assessment & Plan Note (Addendum)
-  A1c is in process, needs PCP f/u -Continue Metformin -Suggest outpatient PCP f/u within 1-2 weeks

## 2023-01-12 NOTE — Progress Notes (Signed)
Neurology Progress Note  Patient ID: Gregory Humphrey is a 82 y.o. with PMHx of  has a past medical history of Diabetes mellitus without complication ([place]), Gilbert's syndrome, Hypertension, Kidney stones, Olecranon bursitis, Seizures ([place]), Stroke ([place]), and Subdural hematoma ([place]).  HPI: Gregory Humphrey is a 82 y.o. male with a previous history of left subdural hematoma treated with craniectomy in 2011 (with left > right sided weakness at the time, potentially secondary to herniation) who presents with intermittent left-sided weakness.  He initially noticed it at 9 AM, but then completely resolved and was back to his normal baseline.  Then subsequently at 9:40 AM the symptoms recurred and he was brought in to the emergency department where he was flaccid on arrival.  On initial head CT, there was noted to subdural fluid collection, felt most likely be chronic related to his previous surgery, but given some slight increase in density compared to CSF, I took him to MRI to confirm that this was not subacute blood.  By the time MRI was started, his symptoms had completely resolved.  With return of NIH to zero.   On further review of history, his subdural hematoma appears to have been secondary to a small AV malformation not felt to have ruptured in the setting of significant coughing; he required craniotomy and drain, intraoperatively was found to have a small parietal cortical AVM  Current Outpatient Medications  Medication Instructions   acetaminophen (TYLENOL) 500-1,000 mg, Oral, See admin instructions, Take 2 tablets ('1000mg'$ ) by mouth every morning and take 1 tablet ('500mg'$ ) by mouth every night   ascorbic acid (VITAMIN C) 500 mg, Oral, Daily with lunch   aspirin EC 81 mg, Oral, Daily   cholecalciferol (VITAMIN D3) 1,000 Units, Oral, Daily with lunch   hydrALAZINE (APRESOLINE) 20 mg, Oral, 3 times daily with meals   isosorbide mononitrate (IMDUR) 15 mg, Oral, Daily with breakfast   lamoTRIgine  (LAMICTAL) 75 mg, Oral, Daily with breakfast   lisinopril (ZESTRIL) 40 mg, Oral, Daily   metFORMIN (GLUCOPHAGE-XR) 500 mg, Oral, Daily with supper   metoprolol succinate (TOPROL-XL) 50 mg, Oral, Daily with breakfast   Multiple Vitamin (MULTIVITAMIN) tablet 1 tablet, Oral, Daily with lunch   rosuvastatin (CRESTOR) 10 mg, Oral, Daily with supper     Subjective: -Symptoms resolved, no further acute neurological complaints   Exam: Current vital signs: BP (!) 171/82 (BP Location: Right Arm)   Pulse 63   Temp 98.8 F (37.1 C)   Resp 20   Ht '5\' 10"'$  (1.778 m)   Wt 86 kg   SpO2 97%   BMI 27.20 kg/m  Vital signs in last 24 hours: Temp:  [98 F (36.7 C)-98.8 F (37.1 C)] 98.8 F (37.1 C) (03/04 0852) Pulse Rate:  [54-63] 63 (03/04 0852) Resp:  [16-20] 20 (03/04 0852) BP: (134-179)/(74-90) 171/82 (03/04 0852) SpO2:  [92 %-98 %] 97 % (03/04 0852)   Gen: In bed, comfortable  Resp: non-labored breathing, no grossly audible wheezing Cardiac: Perfusing extremities well  Abd: soft, nt  Neuro: MS: Awake alert, following commands, conversant, mild speech hesitancy at baseline per patient/wife CN: Slight left facial droop  Motor: Right parietal drift (upwards drift), 5/5 strength throughout, mild physiological tremor bilaterally, intact finger to nose and heel to shin Sensory: Intact to light touch throughout DTR: 3+ and symmetric brachioradialis. 2+ and symmetric patellar  Pertinent Labs:   Basic Metabolic Panel: Recent Labs  Lab 01/11/23 1016 01/12/23 1214  NA 135 137  K 3.7  3.8  CL 102 106  CO2 25 23  GLUCOSE 187* 148*  BUN 19 20  CREATININE 1.01 1.04  CALCIUM 9.3 9.3    CBC: Recent Labs  Lab 01/11/23 1016 01/12/23 1214  WBC 3.8* 4.7  NEUTROABS 2.4 2.9  HGB 14.5 15.7  HCT 41.7 44.6  MCV 91.9 91.8  PLT 129* 145*    Coagulation Studies: Recent Labs    01/11/23 1016  LABPROT 13.7  INR 1.1     Lab Results  Component Value Date   CHOL 95 01/12/2023    HDL 31 (L) 01/12/2023   LDLCALC 10 01/12/2023   TRIG 270 (H) 01/12/2023   CHOLHDL 3.1 01/12/2023   Lab Results  Component Value Date   HGBA1C 5.1 12/04/2005    CTA head and neck No emergent vascular finding. Ordinary atheromatous change for age without significant stenosis or irregularity of major arteries in the head and neck.  MRI brain 1. No acute finding.  Negative for infarct. 2. Advanced chronic small vessel disease. 3. Prior left craniotomy with chronic epidural collection beneath the bone flap. Left inferior frontal and temporal encephalomalacia.  ECHO:  1. Left ventricular ejection fraction, by estimation, is 60 to 65%. The  left ventricle has normal function. The left ventricle has no regional  wall motion abnormalities. There is mild left ventricular hypertrophy.  Left ventricular diastolic parameters  are indeterminate.   2. Right ventricular systolic function is normal. The right ventricular  size is normal.   3. The mitral valve is normal in structure. Mild mitral valve  regurgitation.   4. The aortic valve is normal in structure. Aortic valve regurgitation is  mild to moderate.  [Normal biatrial sizes]  Impression: Most likely stuttering lacunar stroke. No shaking activity suggestive of focal seizure and given prior SDH was on the left, left sided symptoms would not localize to that cortical area.   Recommendations: -Continue dual antiplatelet therapy for 21 days, thereafter continue aspirin 81 mg daily (previously changed plavix to ASA 81 due to easy bruising, which can be discussed further with outpatient neurologist) -Appreciate primary team/PCP optimization of his dyslipidemia.  His LDL is well below neurological goal of less than 70, but triglycerides are very high at 270.  There are no formal goals for triglycerides for stroke patients but increasing data do suggest a role of hypertriglyceridemia in increasing risk -A1c pending, goal less than 7% -Diet,  exercise, weight loss and medication adherence counseling performed -Per wife, patient's snoring improved with bed elevation to reduce GERD symptoms; consider sleep study if snoring worsens or significant symptoms of daytime sleepiness, headaches etc (denies at this time) -Patient prefers outpatient follow-up with [facility] neurology, should be given number to call for appointment [place] MD-PhD [facility] [phone]   [facility] coverage for [facility] is from 8 AM to 4 AM in-house and 4 PM to 8 PM by telephone/video. 8 PM to 8 AM emergent questions or overnight urgent questions should be addressed to Teleneurology On-call or [facility] neurohospitalist; contact information can be found on AMION  Recommendations conveyed to primary team via secure chat

## 2023-01-12 NOTE — Progress Notes (Signed)
     [facility] REFERRAL        Occupational Therapy * Physical Therapy * Speech Therapy                           DATE 01/12/2023  PATIENT NAME Gregory Humphrey  PATIENT MRN [id number]        DIAGNOSIS/DIAGNOSIS CODE   DATE OF DISCHARGE: 01/12/2023       PRIMARY CARE PHYSICIAN      PCP PHONE/FAX  Ocie Cornfield. Tina Griffiths [phone]     Dear Provider (Name: [facility] outpatient __  Fax: [phone]   I certify that I have examined this patient and that occupational/physical/speech therapy is necessary on an outpatient basis.    The patient has expressed interest in completing their recommended course of therapy at your  location.  Once a formal order from the patient's primary care physician has been obtained, please  contact him/her to schedule an appointment for evaluation at your earliest convenience.   [x ]  Physical Therapy Evaluate and Treat  [ x ]  Occupational Therapy Evaluate and Treat  [  ]  Speech Therapy Evaluate and Treat         The patient's primary care physician (listed above) must furnish and be responsible for a formal order such that the recommended services may be furnished while under the primary physician's care, and that the plan of care will be established and reviewed every 30 days (or more often if condition necessitates).

## 2023-01-13 LAB — HEMOGLOBIN A1C
Hgb A1c MFr Bld: 6.5 % — ABNORMAL HIGH (ref 4.8–5.6)
Mean Plasma Glucose: 140 mg/dL

## 2023-01-19 DIAGNOSIS — I69319 Unspecified symptoms and signs involving cognitive functions following cerebral infarction: Secondary | ICD-10-CM | POA: Diagnosis not present

## 2023-01-19 DIAGNOSIS — I2510 Atherosclerotic heart disease of native coronary artery without angina pectoris: Secondary | ICD-10-CM | POA: Diagnosis not present

## 2023-02-10 DIAGNOSIS — M545 Low back pain, unspecified: Secondary | ICD-10-CM | POA: Diagnosis not present

## 2023-02-10 DIAGNOSIS — R29898 Other symptoms and signs involving the musculoskeletal system: Secondary | ICD-10-CM | POA: Diagnosis not present

## 2023-02-10 DIAGNOSIS — G459 Transient cerebral ischemic attack, unspecified: Secondary | ICD-10-CM | POA: Diagnosis not present

## 2023-02-16 DIAGNOSIS — I2510 Atherosclerotic heart disease of native coronary artery without angina pectoris: Secondary | ICD-10-CM | POA: Diagnosis not present

## 2023-02-16 DIAGNOSIS — I252 Old myocardial infarction: Secondary | ICD-10-CM | POA: Diagnosis not present

## 2023-02-16 DIAGNOSIS — G459 Transient cerebral ischemic attack, unspecified: Secondary | ICD-10-CM | POA: Diagnosis not present

## 2023-02-16 DIAGNOSIS — G40909 Epilepsy, unspecified, not intractable, without status epilepticus: Secondary | ICD-10-CM | POA: Diagnosis not present

## 2023-02-16 DIAGNOSIS — I10 Essential (primary) hypertension: Secondary | ICD-10-CM | POA: Diagnosis not present

## 2023-02-16 DIAGNOSIS — Z955 Presence of coronary angioplasty implant and graft: Secondary | ICD-10-CM | POA: Diagnosis not present

## 2023-02-16 DIAGNOSIS — E7849 Other hyperlipidemia: Secondary | ICD-10-CM | POA: Diagnosis not present

## 2023-04-09 DIAGNOSIS — I10 Essential (primary) hypertension: Secondary | ICD-10-CM | POA: Diagnosis not present

## 2023-04-09 DIAGNOSIS — E1122 Type 2 diabetes mellitus with diabetic chronic kidney disease: Secondary | ICD-10-CM | POA: Diagnosis not present

## 2023-04-09 DIAGNOSIS — N1831 Chronic kidney disease, stage 3a: Secondary | ICD-10-CM | POA: Diagnosis not present

## 2023-04-16 DIAGNOSIS — I10 Essential (primary) hypertension: Secondary | ICD-10-CM | POA: Diagnosis not present

## 2023-04-16 DIAGNOSIS — R7303 Prediabetes: Secondary | ICD-10-CM | POA: Diagnosis not present

## 2023-04-16 DIAGNOSIS — I2510 Atherosclerotic heart disease of native coronary artery without angina pectoris: Secondary | ICD-10-CM | POA: Diagnosis not present

## 2023-04-16 DIAGNOSIS — I69319 Unspecified symptoms and signs involving cognitive functions following cerebral infarction: Secondary | ICD-10-CM | POA: Diagnosis not present

## 2023-04-16 DIAGNOSIS — G40909 Epilepsy, unspecified, not intractable, without status epilepticus: Secondary | ICD-10-CM | POA: Diagnosis not present

## 2023-04-30 DIAGNOSIS — E7849 Other hyperlipidemia: Secondary | ICD-10-CM | POA: Diagnosis not present

## 2023-04-30 DIAGNOSIS — I252 Old myocardial infarction: Secondary | ICD-10-CM | POA: Diagnosis not present

## 2023-04-30 DIAGNOSIS — N1831 Chronic kidney disease, stage 3a: Secondary | ICD-10-CM | POA: Diagnosis not present

## 2023-04-30 DIAGNOSIS — Z955 Presence of coronary angioplasty implant and graft: Secondary | ICD-10-CM | POA: Diagnosis not present

## 2023-04-30 DIAGNOSIS — I2510 Atherosclerotic heart disease of native coronary artery without angina pectoris: Secondary | ICD-10-CM | POA: Diagnosis not present

## 2023-04-30 DIAGNOSIS — G459 Transient cerebral ischemic attack, unspecified: Secondary | ICD-10-CM | POA: Diagnosis not present

## 2023-04-30 DIAGNOSIS — E1122 Type 2 diabetes mellitus with diabetic chronic kidney disease: Secondary | ICD-10-CM | POA: Diagnosis not present

## 2023-04-30 DIAGNOSIS — I10 Essential (primary) hypertension: Secondary | ICD-10-CM | POA: Diagnosis not present

## 2023-04-30 DIAGNOSIS — G40909 Epilepsy, unspecified, not intractable, without status epilepticus: Secondary | ICD-10-CM | POA: Diagnosis not present

## 2023-06-02 DIAGNOSIS — E113393 Type 2 diabetes mellitus with moderate nonproliferative diabetic retinopathy without macular edema, bilateral: Secondary | ICD-10-CM | POA: Diagnosis not present

## 2023-06-04 DIAGNOSIS — G459 Transient cerebral ischemic attack, unspecified: Secondary | ICD-10-CM | POA: Diagnosis not present

## 2023-06-04 DIAGNOSIS — R413 Other amnesia: Secondary | ICD-10-CM | POA: Diagnosis not present

## 2023-10-15 DIAGNOSIS — R7303 Prediabetes: Secondary | ICD-10-CM | POA: Diagnosis not present

## 2023-10-15 DIAGNOSIS — I2510 Atherosclerotic heart disease of native coronary artery without angina pectoris: Secondary | ICD-10-CM | POA: Diagnosis not present

## 2023-10-15 DIAGNOSIS — I10 Essential (primary) hypertension: Secondary | ICD-10-CM | POA: Diagnosis not present

## 2023-10-21 DIAGNOSIS — E7800 Pure hypercholesterolemia, unspecified: Secondary | ICD-10-CM | POA: Diagnosis not present

## 2023-10-21 DIAGNOSIS — I2510 Atherosclerotic heart disease of native coronary artery without angina pectoris: Secondary | ICD-10-CM | POA: Diagnosis not present

## 2023-10-21 DIAGNOSIS — G40909 Epilepsy, unspecified, not intractable, without status epilepticus: Secondary | ICD-10-CM | POA: Diagnosis not present

## 2023-10-21 DIAGNOSIS — E118 Type 2 diabetes mellitus with unspecified complications: Secondary | ICD-10-CM | POA: Diagnosis not present

## 2023-10-21 DIAGNOSIS — I10 Essential (primary) hypertension: Secondary | ICD-10-CM | POA: Diagnosis not present

## 2023-10-21 DIAGNOSIS — I69319 Unspecified symptoms and signs involving cognitive functions following cerebral infarction: Secondary | ICD-10-CM | POA: Diagnosis not present

## 2023-10-29 DIAGNOSIS — E7849 Other hyperlipidemia: Secondary | ICD-10-CM | POA: Diagnosis not present

## 2023-10-29 DIAGNOSIS — G40909 Epilepsy, unspecified, not intractable, without status epilepticus: Secondary | ICD-10-CM | POA: Diagnosis not present

## 2023-10-29 DIAGNOSIS — E1122 Type 2 diabetes mellitus with diabetic chronic kidney disease: Secondary | ICD-10-CM | POA: Diagnosis not present

## 2023-10-29 DIAGNOSIS — G250 Essential tremor: Secondary | ICD-10-CM | POA: Diagnosis not present

## 2023-10-29 DIAGNOSIS — I10 Essential (primary) hypertension: Secondary | ICD-10-CM | POA: Diagnosis not present

## 2023-10-29 DIAGNOSIS — G459 Transient cerebral ischemic attack, unspecified: Secondary | ICD-10-CM | POA: Diagnosis not present

## 2023-10-29 DIAGNOSIS — I2510 Atherosclerotic heart disease of native coronary artery without angina pectoris: Secondary | ICD-10-CM | POA: Diagnosis not present

## 2023-10-29 DIAGNOSIS — I499 Cardiac arrhythmia, unspecified: Secondary | ICD-10-CM | POA: Diagnosis not present

## 2023-10-29 DIAGNOSIS — E7800 Pure hypercholesterolemia, unspecified: Secondary | ICD-10-CM | POA: Diagnosis not present

## 2023-10-29 DIAGNOSIS — Z955 Presence of coronary angioplasty implant and graft: Secondary | ICD-10-CM | POA: Diagnosis not present

## 2023-10-29 DIAGNOSIS — I69319 Unspecified symptoms and signs involving cognitive functions following cerebral infarction: Secondary | ICD-10-CM | POA: Diagnosis not present

## 2023-10-29 DIAGNOSIS — I252 Old myocardial infarction: Secondary | ICD-10-CM | POA: Diagnosis not present

## 2023-11-16 DIAGNOSIS — Z85828 Personal history of other malignant neoplasm of skin: Secondary | ICD-10-CM | POA: Diagnosis not present

## 2023-11-16 DIAGNOSIS — L570 Actinic keratosis: Secondary | ICD-10-CM | POA: Diagnosis not present

## 2023-11-16 DIAGNOSIS — E113393 Type 2 diabetes mellitus with moderate nonproliferative diabetic retinopathy without macular edema, bilateral: Secondary | ICD-10-CM | POA: Diagnosis not present

## 2023-11-16 DIAGNOSIS — D2262 Melanocytic nevi of left upper limb, including shoulder: Secondary | ICD-10-CM | POA: Diagnosis not present

## 2023-11-16 DIAGNOSIS — D225 Melanocytic nevi of trunk: Secondary | ICD-10-CM | POA: Diagnosis not present

## 2023-11-16 DIAGNOSIS — Z86006 Personal history of melanoma in-situ: Secondary | ICD-10-CM | POA: Diagnosis not present

## 2023-11-16 DIAGNOSIS — Z85820 Personal history of malignant melanoma of skin: Secondary | ICD-10-CM | POA: Diagnosis not present

## 2023-11-16 DIAGNOSIS — L821 Other seborrheic keratosis: Secondary | ICD-10-CM | POA: Diagnosis not present

## 2023-11-16 DIAGNOSIS — D2261 Melanocytic nevi of right upper limb, including shoulder: Secondary | ICD-10-CM | POA: Diagnosis not present

## 2024-03-28 DIAGNOSIS — S39012A Strain of muscle, fascia and tendon of lower back, initial encounter: Secondary | ICD-10-CM | POA: Diagnosis not present

## 2024-03-28 DIAGNOSIS — M47816 Spondylosis without myelopathy or radiculopathy, lumbar region: Secondary | ICD-10-CM | POA: Diagnosis not present

## 2024-04-06 DIAGNOSIS — M545 Low back pain, unspecified: Secondary | ICD-10-CM | POA: Diagnosis not present

## 2024-04-11 DIAGNOSIS — M545 Low back pain, unspecified: Secondary | ICD-10-CM | POA: Diagnosis not present

## 2024-04-14 DIAGNOSIS — M545 Low back pain, unspecified: Secondary | ICD-10-CM | POA: Diagnosis not present

## 2024-04-21 DIAGNOSIS — M545 Low back pain, unspecified: Secondary | ICD-10-CM | POA: Diagnosis not present

## 2024-04-21 DIAGNOSIS — E113393 Type 2 diabetes mellitus with moderate nonproliferative diabetic retinopathy without macular edema, bilateral: Secondary | ICD-10-CM | POA: Diagnosis not present

## 2024-04-27 DIAGNOSIS — M545 Low back pain, unspecified: Secondary | ICD-10-CM | POA: Diagnosis not present

## 2024-05-10 DIAGNOSIS — I10 Essential (primary) hypertension: Secondary | ICD-10-CM | POA: Diagnosis not present

## 2024-05-10 DIAGNOSIS — E7800 Pure hypercholesterolemia, unspecified: Secondary | ICD-10-CM | POA: Diagnosis not present

## 2024-05-10 DIAGNOSIS — E118 Type 2 diabetes mellitus with unspecified complications: Secondary | ICD-10-CM | POA: Diagnosis not present

## 2024-05-10 DIAGNOSIS — I2510 Atherosclerotic heart disease of native coronary artery without angina pectoris: Secondary | ICD-10-CM | POA: Diagnosis not present

## 2024-05-18 DIAGNOSIS — I10 Essential (primary) hypertension: Secondary | ICD-10-CM | POA: Diagnosis not present

## 2024-05-18 DIAGNOSIS — E118 Type 2 diabetes mellitus with unspecified complications: Secondary | ICD-10-CM | POA: Diagnosis not present

## 2024-05-18 DIAGNOSIS — I69319 Unspecified symptoms and signs involving cognitive functions following cerebral infarction: Secondary | ICD-10-CM | POA: Diagnosis not present

## 2024-05-18 DIAGNOSIS — Z1331 Encounter for screening for depression: Secondary | ICD-10-CM | POA: Diagnosis not present

## 2024-05-18 DIAGNOSIS — G40909 Epilepsy, unspecified, not intractable, without status epilepticus: Secondary | ICD-10-CM | POA: Diagnosis not present

## 2024-05-18 DIAGNOSIS — I2510 Atherosclerotic heart disease of native coronary artery without angina pectoris: Secondary | ICD-10-CM | POA: Diagnosis not present

## 2024-05-18 DIAGNOSIS — Z0000 Encounter for general adult medical examination without abnormal findings: Secondary | ICD-10-CM | POA: Diagnosis not present

## 2024-05-25 DIAGNOSIS — N1831 Chronic kidney disease, stage 3a: Secondary | ICD-10-CM | POA: Diagnosis not present

## 2024-05-25 DIAGNOSIS — I252 Old myocardial infarction: Secondary | ICD-10-CM | POA: Diagnosis not present

## 2024-05-25 DIAGNOSIS — R0789 Other chest pain: Secondary | ICD-10-CM | POA: Diagnosis not present

## 2024-05-25 DIAGNOSIS — G40909 Epilepsy, unspecified, not intractable, without status epilepticus: Secondary | ICD-10-CM | POA: Diagnosis not present

## 2024-05-25 DIAGNOSIS — Z955 Presence of coronary angioplasty implant and graft: Secondary | ICD-10-CM | POA: Diagnosis not present

## 2024-05-25 DIAGNOSIS — G459 Transient cerebral ischemic attack, unspecified: Secondary | ICD-10-CM | POA: Diagnosis not present

## 2024-05-25 DIAGNOSIS — E7800 Pure hypercholesterolemia, unspecified: Secondary | ICD-10-CM | POA: Diagnosis not present

## 2024-05-25 DIAGNOSIS — I10 Essential (primary) hypertension: Secondary | ICD-10-CM | POA: Diagnosis not present

## 2024-05-25 DIAGNOSIS — E1122 Type 2 diabetes mellitus with diabetic chronic kidney disease: Secondary | ICD-10-CM | POA: Diagnosis not present

## 2024-05-25 DIAGNOSIS — G250 Essential tremor: Secondary | ICD-10-CM | POA: Diagnosis not present

## 2024-05-25 DIAGNOSIS — I2510 Atherosclerotic heart disease of native coronary artery without angina pectoris: Secondary | ICD-10-CM | POA: Diagnosis not present

## 2024-06-06 DIAGNOSIS — R413 Other amnesia: Secondary | ICD-10-CM | POA: Diagnosis not present

## 2024-06-06 DIAGNOSIS — Z8673 Personal history of transient ischemic attack (TIA), and cerebral infarction without residual deficits: Secondary | ICD-10-CM | POA: Diagnosis not present

## 2024-06-06 DIAGNOSIS — I6930 Unspecified sequelae of cerebral infarction: Secondary | ICD-10-CM | POA: Diagnosis not present

## 2024-06-06 DIAGNOSIS — R251 Tremor, unspecified: Secondary | ICD-10-CM | POA: Diagnosis not present

## 2024-06-13 ENCOUNTER — Other Ambulatory Visit: Payer: Self-pay | Admitting: Cardiovascular Disease

## 2024-06-13 DIAGNOSIS — Z0000 Encounter for general adult medical examination without abnormal findings: Secondary | ICD-10-CM

## 2024-06-28 ENCOUNTER — Ambulatory Visit (INDEPENDENT_AMBULATORY_CARE_PROVIDER_SITE_OTHER): Payer: PRIVATE HEALTH INSURANCE

## 2024-06-28 DIAGNOSIS — Z0000 Encounter for general adult medical examination without abnormal findings: Secondary | ICD-10-CM

## 2024-07-31 ENCOUNTER — Emergency Department

## 2024-07-31 ENCOUNTER — Inpatient Hospital Stay
Admission: EM | Admit: 2024-07-31 | Discharge: 2024-08-03 | DRG: 398 | Disposition: A | Discharge status: Home / Self Care | Attending: Internal Medicine | Admitting: Internal Medicine

## 2024-07-31 ENCOUNTER — Other Ambulatory Visit: Payer: Self-pay

## 2024-07-31 DIAGNOSIS — R0902 Hypoxemia: Secondary | ICD-10-CM | POA: Diagnosis not present

## 2024-07-31 DIAGNOSIS — R509 Fever, unspecified: Secondary | ICD-10-CM

## 2024-07-31 DIAGNOSIS — I4719 Other supraventricular tachycardia: Secondary | ICD-10-CM | POA: Diagnosis not present

## 2024-07-31 DIAGNOSIS — E872 Acidosis, unspecified: Secondary | ICD-10-CM | POA: Diagnosis present

## 2024-07-31 DIAGNOSIS — Z955 Presence of coronary angioplasty implant and graft: Secondary | ICD-10-CM

## 2024-07-31 DIAGNOSIS — Z7984 Long term (current) use of oral hypoglycemic drugs: Secondary | ICD-10-CM

## 2024-07-31 DIAGNOSIS — Z7902 Long term (current) use of antithrombotics/antiplatelets: Secondary | ICD-10-CM

## 2024-07-31 DIAGNOSIS — E785 Hyperlipidemia, unspecified: Secondary | ICD-10-CM | POA: Diagnosis present

## 2024-07-31 DIAGNOSIS — K3580 Unspecified acute appendicitis: Secondary | ICD-10-CM | POA: Diagnosis not present

## 2024-07-31 DIAGNOSIS — R000 Tachycardia, unspecified: Secondary | ICD-10-CM | POA: Diagnosis not present

## 2024-07-31 DIAGNOSIS — Z66 Do not resuscitate: Secondary | ICD-10-CM | POA: Diagnosis present

## 2024-07-31 DIAGNOSIS — I10 Essential (primary) hypertension: Secondary | ICD-10-CM | POA: Diagnosis not present

## 2024-07-31 DIAGNOSIS — K3589 Other acute appendicitis without perforation or gangrene: Principal | ICD-10-CM | POA: Diagnosis present

## 2024-07-31 DIAGNOSIS — K567 Ileus, unspecified: Secondary | ICD-10-CM | POA: Diagnosis not present

## 2024-07-31 DIAGNOSIS — I471 Supraventricular tachycardia, unspecified: Secondary | ICD-10-CM | POA: Diagnosis not present

## 2024-07-31 DIAGNOSIS — R410 Disorientation, unspecified: Secondary | ICD-10-CM | POA: Diagnosis not present

## 2024-07-31 DIAGNOSIS — F0390 Unspecified dementia without behavioral disturbance: Secondary | ICD-10-CM | POA: Diagnosis not present

## 2024-07-31 DIAGNOSIS — Z8249 Family history of ischemic heart disease and other diseases of the circulatory system: Secondary | ICD-10-CM

## 2024-07-31 DIAGNOSIS — E119 Type 2 diabetes mellitus without complications: Secondary | ICD-10-CM | POA: Diagnosis not present

## 2024-07-31 DIAGNOSIS — R0600 Dyspnea, unspecified: Secondary | ICD-10-CM | POA: Diagnosis not present

## 2024-07-31 DIAGNOSIS — R1011 Right upper quadrant pain: Secondary | ICD-10-CM | POA: Diagnosis not present

## 2024-07-31 DIAGNOSIS — Z8673 Personal history of transient ischemic attack (TIA), and cerebral infarction without residual deficits: Secondary | ICD-10-CM | POA: Diagnosis not present

## 2024-07-31 DIAGNOSIS — Z7982 Long term (current) use of aspirin: Secondary | ICD-10-CM

## 2024-07-31 DIAGNOSIS — Z833 Family history of diabetes mellitus: Secondary | ICD-10-CM | POA: Diagnosis not present

## 2024-07-31 DIAGNOSIS — Z823 Family history of stroke: Secondary | ICD-10-CM | POA: Diagnosis not present

## 2024-07-31 DIAGNOSIS — Z1152 Encounter for screening for COVID-19: Secondary | ICD-10-CM | POA: Diagnosis not present

## 2024-07-31 DIAGNOSIS — A419 Sepsis, unspecified organism: Secondary | ICD-10-CM | POA: Diagnosis not present

## 2024-07-31 DIAGNOSIS — Z79899 Other long term (current) drug therapy: Secondary | ICD-10-CM | POA: Diagnosis not present

## 2024-07-31 DIAGNOSIS — K3532 Acute appendicitis with perforation and localized peritonitis, without abscess: Secondary | ICD-10-CM

## 2024-07-31 DIAGNOSIS — K5730 Diverticulosis of large intestine without perforation or abscess without bleeding: Secondary | ICD-10-CM | POA: Diagnosis not present

## 2024-07-31 DIAGNOSIS — I2510 Atherosclerotic heart disease of native coronary artery without angina pectoris: Secondary | ICD-10-CM | POA: Diagnosis not present

## 2024-07-31 DIAGNOSIS — Z9089 Acquired absence of other organs: Secondary | ICD-10-CM | POA: Diagnosis not present

## 2024-07-31 DIAGNOSIS — K8020 Calculus of gallbladder without cholecystitis without obstruction: Secondary | ICD-10-CM | POA: Diagnosis not present

## 2024-07-31 DIAGNOSIS — G9389 Other specified disorders of brain: Secondary | ICD-10-CM | POA: Diagnosis not present

## 2024-07-31 DIAGNOSIS — D696 Thrombocytopenia, unspecified: Secondary | ICD-10-CM | POA: Diagnosis present

## 2024-07-31 DIAGNOSIS — R109 Unspecified abdominal pain: Secondary | ICD-10-CM | POA: Diagnosis not present

## 2024-07-31 DIAGNOSIS — G40909 Epilepsy, unspecified, not intractable, without status epilepticus: Secondary | ICD-10-CM | POA: Diagnosis not present

## 2024-07-31 DIAGNOSIS — R110 Nausea: Secondary | ICD-10-CM | POA: Diagnosis not present

## 2024-07-31 LAB — COMPREHENSIVE METABOLIC PANEL WITH GFR
ALT: 36 U/L (ref 0–44)
AST: 29 U/L (ref 15–41)
Albumin: 4.4 g/dL (ref 3.5–5.0)
Alkaline Phosphatase: 50 U/L (ref 38–126)
Anion gap: 13 (ref 5–15)
BUN: 22 mg/dL (ref 8–23)
CO2: 21 mmol/L — ABNORMAL LOW (ref 22–32)
Calcium: 9.5 mg/dL (ref 8.9–10.3)
Chloride: 103 mmol/L (ref 98–111)
Creatinine, Ser: 1.14 mg/dL (ref 0.61–1.24)
GFR, Estimated: >60 mL/min (ref >60)
Glucose, Bld: 167 mg/dL — ABNORMAL HIGH (ref 70–99)
Potassium: 3.6 mmol/L (ref 3.5–5.1)
Sodium: 137 mmol/L (ref 135–145)
Total Bilirubin: 1.3 mg/dL — ABNORMAL HIGH (ref 0.0–1.2)
Total Protein: 7.4 g/dL (ref 6.5–8.1)

## 2024-07-31 LAB — URINALYSIS, COMPLETE (UACMP) WITH MICROSCOPIC
Bacteria, UA: NONE SEEN
Bilirubin Urine: NEGATIVE
Glucose, UA: 150 mg/dL — AB
Hgb urine dipstick: NEGATIVE
Ketones, ur: NEGATIVE mg/dL
Leukocytes,Ua: NEGATIVE
Nitrite: NEGATIVE
Protein, ur: 100 mg/dL — AB
Specific Gravity, Urine: 1.015 (ref 1.005–1.030)
Squamous Epithelial / HPF: 0 /HPF (ref 0–5)
pH: 6 (ref 5.0–8.0)

## 2024-07-31 LAB — RESP PANEL BY RT-PCR (RSV, FLU A&B, COVID)  RVPGX2
Influenza A by PCR: NEGATIVE
Influenza B by PCR: NEGATIVE
Resp Syncytial Virus by PCR: NEGATIVE
SARS Coronavirus 2 by RT PCR: NEGATIVE

## 2024-07-31 LAB — CBC WITH DIFFERENTIAL/PLATELET
Abs Immature Granulocytes: 0.04 K/uL (ref 0.00–0.07)
Basophils Absolute: 0 K/uL (ref 0.0–0.1)
Basophils Relative: 0 %
Eosinophils Absolute: 0 K/uL (ref 0.0–0.5)
Eosinophils Relative: 0 %
HCT: 45.2 % (ref 39.0–52.0)
Hemoglobin: 15.7 g/dL (ref 13.0–17.0)
Immature Granulocytes: 1 %
Lymphocytes Relative: 3 %
Lymphs Abs: 0.2 K/uL — ABNORMAL LOW (ref 0.7–4.0)
MCH: 32.2 pg (ref 26.0–34.0)
MCHC: 34.7 g/dL (ref 30.0–36.0)
MCV: 92.8 fL (ref 80.0–100.0)
Monocytes Absolute: 0.1 K/uL (ref 0.1–1.0)
Monocytes Relative: 1 %
Neutro Abs: 5.6 K/uL (ref 1.7–7.7)
Neutrophils Relative %: 95 %
Platelets: 129 K/uL — ABNORMAL LOW (ref 150–400)
RBC: 4.87 MIL/uL (ref 4.22–5.81)
RDW: 13.2 % (ref 11.5–15.5)
WBC: 5.9 K/uL (ref 4.0–10.5)
nRBC: 0 % (ref 0.0–0.2)

## 2024-07-31 LAB — PROTIME-INR
INR: 1 (ref 0.8–1.2)
Prothrombin Time: 14.3 s (ref 11.4–15.2)

## 2024-07-31 LAB — LACTIC ACID, PLASMA: Lactic Acid, Venous: 2.3 mmol/L (ref 0.5–1.9)

## 2024-07-31 LAB — TROPONIN I (HIGH SENSITIVITY): Troponin I (High Sensitivity): 8 ng/L (ref <18)

## 2024-07-31 MED ORDER — ACETAMINOPHEN 500 MG PO TABS
1000.0000 mg | ORAL_TABLET | Freq: Once | ORAL | Status: AC
  Administered 2024-07-31: 1000 mg via ORAL
  Filled 2024-07-31: qty 2

## 2024-07-31 MED ORDER — SODIUM CHLORIDE 0.9 % IV SOLN
2.0000 g | Freq: Once | INTRAVENOUS | Status: AC
  Administered 2024-07-31: 2 g via INTRAVENOUS
  Filled 2024-07-31: qty 20

## 2024-07-31 MED ORDER — SODIUM CHLORIDE 0.9 % IV BOLUS
1000.0000 mL | Freq: Once | INTRAVENOUS | Status: AC
  Administered 2024-07-31: 1000 mL via INTRAVENOUS

## 2024-07-31 MED ORDER — ADENOSINE 6 MG/2ML IV SOLN
INTRAVENOUS | Status: AC
  Administered 2024-07-31: 6 mg
  Filled 2024-07-31: qty 2

## 2024-07-31 NOTE — ED Triage Notes (Addendum)
 Pt comes with HTN, nausea and the shakes. Pt states no cp or sob. Pt states he was at church and got home and this started.   Pt takes bp meds and has taken them today. Wife reports the bp readings were in the 200s at home. Wife also states his sugars have been little elevated. Pt is diabetic.   Pt now stating he was having trouble getting out bed. Wife states the pt is now getting confused. The pt repeats Im getting confused. Wife reports hx of stroke and brain bleed in past.   Pt taken straight to ED 26 for doctor to assess.

## 2024-07-31 NOTE — ED Provider Notes (Signed)
 [facility] Provider Note    Event Date/Time   First MD Initiated Contact with Patient 07/31/24 1914     (approximate)   History   Hypertension  Pt comes with HTN, nausea and the shakes. Pt states no cp or sob. Pt states he was at church and got home and this started.   Pt takes bp meds and has taken them today. Wife reports the bp readings were in the 200s at home. Wife also states his sugars have been little elevated. Pt is diabetic.   Pt now stating he was having trouble getting out bed. Wife states the pt is now getting confused. The pt repeats Im getting confused. Wife reports hx of stroke and brain bleed in past.   Pt taken straight to ED 26 for doctor to assess.    HPI CHRISTERPHER CLOS is a 83 y.o. male PMH multiple medical comorbidities including diabetes, hyperlipidemia, hypertension, prior stroke, prior seizures, CAD, prior subdural hematoma presents for evaluation of tremulousness -Companied by family member who notes he was somewhat slower to respond and occasionally responding appropriately at church today.  Became very tremulous after church around 6:30 PM.  Checked his blood pressure and found it to be hypertensive so brought him to emergency department.  On my evaluation, patient is warm to touch, 100.3 Fahrenheit orally.  Has complained of some mild nausea and perhaps urinary urgency.  No recent cough, abdominal pain, chest pain.     Physical Exam   Triage Vital Signs: ED Triage Vitals  Encounter Vitals Group     BP 07/31/24 1859 (!) 181/91     Girls Systolic BP Percentile --      Girls Diastolic BP Percentile --      Boys Systolic BP Percentile --      Boys Diastolic BP Percentile --      Pulse Rate 07/31/24 1859 80     Resp 07/31/24 1859 17     Temp 07/31/24 1859 98.6 F (37 C)     Temp src --      SpO2 07/31/24 1859 92 %     Weight --      Height --      Head Circumference --      Peak Flow --      Pain Score 07/31/24 1858  0     Pain Loc --      Pain Education --      Exclude from Growth Chart --     Most recent vital signs: Vitals:   07/31/24 2052 07/31/24 2200  BP:  (!) 154/97  Pulse: (!) 106 96  Resp: 17 16  Temp:    SpO2: 96% 95%     General: Awake, no distress.  CV:  Good peripheral perfusion. RRR, RP 2+ Resp:  Normal effort. CTAB Abd:  No distention. Nontender to deep palpation throughout Neuro:  Patient mentating appropriately, alert, oriented, no facial asymmetry, no focal motor deficit appreciated   ED Results / Procedures / Treatments   Labs (all labs ordered are listed, but only abnormal results are displayed) Labs Reviewed  CBC WITH DIFFERENTIAL/PLATELET - Abnormal; Notable for the following components:      Result Value   Platelets 129 (*)    Lymphs Abs 0.2 (*)    All other components within normal limits  COMPREHENSIVE METABOLIC PANEL WITH GFR - Abnormal; Notable for the following components:   CO2 21 (*)    Glucose, Bld 167 (*)  Total Bilirubin 1.3 (*)    All other components within normal limits  LACTIC ACID, PLASMA - Abnormal; Notable for the following components:   Lactic Acid, Venous 2.3 (*)    All other components within normal limits  URINALYSIS, COMPLETE (UACMP) WITH MICROSCOPIC - Abnormal; Notable for the following components:   Color, Urine YELLOW (*)    APPearance CLEAR (*)    Glucose, UA 150 (*)    Protein, ur 100 (*)    All other components within normal limits  RESP PANEL BY RT-PCR (RSV, FLU A&B, COVID)  RVPGX2  CULTURE, BLOOD (ROUTINE X 2)  CULTURE, BLOOD (ROUTINE X 2)  RESPIRATORY PANEL BY PCR  PROTIME-INR  LACTIC ACID, PLASMA  TROPONIN I (HIGH SENSITIVITY)  TROPONIN I (HIGH SENSITIVITY)     EKG  Ecg = sinus rhythm, rate 79, no gross ST elevation or depression, no significant repolarization abnormality, normal axis, intervals.  No clear evidence of ischemia or arrhythmia on my interpretation.  Some baseline artifact  present.   RADIOLOGY Radiology interpreted myself and radiology report reviewed.  No acute pathology identified.    PROCEDURES:  Critical Care performed: Yes, see critical care procedure note(s)  .Critical Care  Performed by: Clarine Ozell LABOR, MD Authorized by: Clarine Ozell LABOR, MD   Critical care provider statement:    Critical care time (minutes):  35   Critical care was necessary to treat or prevent imminent or life-threatening deterioration of the following conditions:  Cardiac failure   Critical care was time spent personally by me on the following activities:  Development of treatment plan with patient or surrogate, discussions with consultants, evaluation of patient's response to treatment, examination of patient, ordering and review of laboratory studies, ordering and review of radiographic studies, ordering and performing treatments and interventions, pulse oximetry, re-evaluation of patient's condition and review of old charts   I assumed direction of critical care for this patient from another provider in my specialty: no     Care discussed with: admitting provider      MEDICATIONS ORDERED IN ED: Medications  cefTRIAXone  (ROCEPHIN ) 2 g in sodium chloride  0.9 % 100 mL IVPB (2 g Intravenous New Bag/Given 07/31/24 2345)  acetaminophen  (TYLENOL ) tablet 1,000 mg (1,000 mg Oral Given 07/31/24 2027)  adenosine  (ADENOCARD ) 6 MG/2ML injection (6 mg  Given 07/31/24 2051)  sodium chloride  0.9 % bolus 1,000 mL (0 mLs Intravenous Stopped 07/31/24 2329)     IMPRESSION / MDM / ASSESSMENT AND PLAN / ED COURSE  I reviewed the triage vital signs and the nursing notes.                              DDX/MDM/AP: Differential diagnosis includes, but is not limited to, suspect underlying infection with mild encephalopathy, considered but doubt intracranial hemorrhage, no clear findings to suggest stroke at this time, does not appear to have had a seizure.  Presentation overall not consistent  with hypertensive emergency and blood pressure is spontaneously downtrending without intervention.  Consider underlying viral syndrome including influenza or COVID-19, UTI, pneumonia.  Plan: - Labs - Chest x-ray - EKG CT head - Reassess  Patient's presentation is most consistent with acute presentation with potential threat to life or bodily function.  The patient is on the cardiac monitor to evaluate for evidence of arrhythmia and/or significant heart rate changes.  ED course below.  Patient with oral temperature 100.3, lactic acidosis of 2.3.  No leukocytosis.  No clear source of infection-open abdominal exam benign, LFTs reassuring, chest x-ray normal, urinalysis unremarkable.  COVID/flu/RSV negative.  Patient did develop SVT that was refractory to vagal maneuvers, did respond to adenosine .  Overall unclear source of infection at this time.  Treating empirically for possible early sepsis with ceftriaxone , blood cultures collected prior.  Admitted to hospitalist service.  Received 1 L IV fluid, not meeting septic shock criteria.  Clinical Course as of 07/31/24 2350  Sun Jul 31, 2024  2015 [facility]: IMPRESSION: Atrophy, chronic microvascular disease.  No acute intracranial abnormality.  Stable inferior left frontal and temporal encephalomalacia.  Stable small low-density extra-axial fluid collection underlying the left side craniotomy defect.   [MM]  2048 Patient became tachycardic to the 170s.  EKG consistent with SVT.  Normotensive. [MM]  2104 Delayed entry, SVT did not resolve with vagal maneuver, aborted with single dose of IV adenosine   Repeat EKG sinus rhythm.  No clear evidence of ischemia. [MM]  2105 UA w/ no e/o infxn [MM]  2111 Reviewed, overall unremarkable, mild thrombocytopenia  CMP reviewed, no clinically significant derangements [MM]  2141 Viral swab neg [MM]  2142 CXR: IMPRESSION: 1. No acute abnormalities.   [MM]  2159 Trop wnl [MM]  2226 Hospitalist  consult order placed [MM]    Clinical Course User Index [MM] Clarine Ozell LABOR, MD     FINAL CLINICAL IMPRESSION(S) / ED DIAGNOSES   Final diagnoses:  SVT (supraventricular tachycardia) (HCC)  Lactic acidosis  Fever, unspecified fever cause     Rx / DC Orders   ED Discharge Orders     None        Note:  This document was prepared using Dragon voice recognition software and may include unintentional dictation errors.   Clarine Ozell LABOR, MD 07/31/24 2350

## 2024-07-31 NOTE — ED Notes (Signed)
 This RN went in to assist pulling pt up in bed with NT Alexis when this RN noticed pts HR was 170-180s. EKG obtained, provider notified. Meds ordered and obtained, provider at bedside

## 2024-07-31 NOTE — ED Notes (Signed)
 Pt to CT at this time.

## 2024-07-31 NOTE — ED Notes (Signed)
 This RN answered call bell and pt had to urinate. This RN placed non-slips socks on pt and assisted pt with standing to use a urinal. Pt had voided some in the bed before standing. This RN assisted pt into hospital gown and changed pt's bedding while pt was transferred to CT. RN left urine sample on pt's counter in urine cup for primary RN.

## 2024-08-01 ENCOUNTER — Inpatient Hospital Stay

## 2024-08-01 ENCOUNTER — Inpatient Hospital Stay
Admit: 2024-08-01 | Discharge: 2024-08-01 | Disposition: A | Discharge status: Home / Self Care | Attending: Student | Admitting: Student

## 2024-08-01 DIAGNOSIS — I471 Supraventricular tachycardia, unspecified: Secondary | ICD-10-CM

## 2024-08-01 DIAGNOSIS — I10 Essential (primary) hypertension: Secondary | ICD-10-CM

## 2024-08-01 DIAGNOSIS — E785 Hyperlipidemia, unspecified: Secondary | ICD-10-CM | POA: Insufficient documentation

## 2024-08-01 DIAGNOSIS — E119 Type 2 diabetes mellitus without complications: Secondary | ICD-10-CM

## 2024-08-01 LAB — ECHOCARDIOGRAM COMPLETE
AR max vel: 3.14 cm2
AV Area VTI: 3.52 cm2
AV Area mean vel: 2.87 cm2
AV Mean grad: 3.5 mmHg
AV Peak grad: 5.1 mmHg
Ao pk vel: 1.13 m/s
Area-P 1/2: 2.53 cm2
Height: 70 in
MV VTI: 2.43 cm2
S' Lateral: 2.3 cm
Weight: 2880 [oz_av]

## 2024-08-01 LAB — BASIC METABOLIC PANEL WITH GFR
Anion gap: 10 (ref 5–15)
BUN: 16 mg/dL (ref 8–23)
CO2: 21 mmol/L — ABNORMAL LOW (ref 22–32)
Calcium: 8.8 mg/dL — ABNORMAL LOW (ref 8.9–10.3)
Chloride: 103 mmol/L (ref 98–111)
Creatinine, Ser: 0.92 mg/dL (ref 0.61–1.24)
GFR, Estimated: >60 mL/min (ref >60)
Glucose, Bld: 173 mg/dL — ABNORMAL HIGH (ref 70–99)
Potassium: 3.8 mmol/L (ref 3.5–5.1)
Sodium: 134 mmol/L — ABNORMAL LOW (ref 135–145)

## 2024-08-01 LAB — RESPIRATORY PANEL BY PCR

## 2024-08-01 LAB — CBC
HCT: 41.9 % (ref 39.0–52.0)
Hemoglobin: 14.8 g/dL (ref 13.0–17.0)
MCH: 33 pg (ref 26.0–34.0)
MCHC: 35.3 g/dL (ref 30.0–36.0)
MCV: 93.5 fL (ref 80.0–100.0)
Platelets: 143 K/uL — ABNORMAL LOW (ref 150–400)
RBC: 4.48 MIL/uL (ref 4.22–5.81)
RDW: 13.2 % (ref 11.5–15.5)
WBC: 10.6 K/uL — ABNORMAL HIGH (ref 4.0–10.5)
nRBC: 0 % (ref 0.0–0.2)

## 2024-08-01 LAB — PROTIME-INR
INR: 1.1 (ref 0.8–1.2)
Prothrombin Time: 14.8 s (ref 11.4–15.2)

## 2024-08-01 LAB — CORTISOL-AM, BLOOD: Cortisol - AM: 22.7 ug/dL — ABNORMAL HIGH (ref 6.7–22.6)

## 2024-08-01 LAB — LACTIC ACID, PLASMA: Lactic Acid, Venous: 1.9 mmol/L (ref 0.5–1.9)

## 2024-08-01 LAB — MAGNESIUM: Magnesium: 2.1 mg/dL (ref 1.7–2.4)

## 2024-08-01 LAB — TROPONIN I (HIGH SENSITIVITY): Troponin I (High Sensitivity): 17 ng/L (ref <18)

## 2024-08-01 MED ORDER — ADULT MULTIVITAMIN W/MINERALS CH
1.0000 | ORAL_TABLET | Freq: Every day | ORAL | Status: DC
  Administered 2024-08-01: 1 via ORAL
  Filled 2024-08-01: qty 1

## 2024-08-01 MED ORDER — POTASSIUM CHLORIDE 20 MEQ PO PACK
40.0000 meq | PACK | Freq: Once | ORAL | Status: AC
Start: 2024-08-01 — End: 2024-08-01
  Administered 2024-08-01: 40 meq via ORAL
  Filled 2024-08-01: qty 2

## 2024-08-01 MED ORDER — ACETAMINOPHEN 325 MG PO TABS
650.0000 mg | ORAL_TABLET | Freq: Four times a day (QID) | ORAL | Status: DC | PRN
  Administered 2024-08-01: 650 mg via ORAL
  Filled 2024-08-01: qty 2

## 2024-08-01 MED ORDER — LAMOTRIGINE 25 MG PO TABS
75.0000 mg | ORAL_TABLET | Freq: Every day | ORAL | Status: DC
  Administered 2024-08-01 – 2024-08-03 (×3): 75 mg via ORAL
  Filled 2024-08-01 (×3): qty 3

## 2024-08-01 MED ORDER — SODIUM CHLORIDE 0.9 % IV SOLN: 2.0000 g | Freq: Once | INTRAVENOUS | Status: DC

## 2024-08-01 MED ORDER — LACTATED RINGERS IV SOLN: 150.0000 mL/h | INTRAVENOUS | Status: DC

## 2024-08-01 MED ORDER — TRAZODONE HCL 50 MG PO TABS: 25.0000 mg | ORAL_TABLET | Freq: Every evening | ORAL | Status: DC | PRN

## 2024-08-01 MED ORDER — VANCOMYCIN HCL 1750 MG/350ML IV SOLN
1750.0000 mg | INTRAVENOUS | Status: DC
  Administered 2024-08-02: 1750 mg via INTRAVENOUS
  Filled 2024-08-01: qty 350

## 2024-08-01 MED ORDER — MORPHINE SULFATE (PF) 2 MG/ML IV SOLN: 1.0000 mg | INTRAVENOUS | Status: DC | PRN

## 2024-08-01 MED ORDER — MAGNESIUM HYDROXIDE 400 MG/5ML PO SUSP: 30.0000 mL | Freq: Every day | ORAL | Status: DC | PRN

## 2024-08-01 MED ORDER — IOHEXOL 300 MG/ML  SOLN
100.0000 mL | Freq: Once | INTRAMUSCULAR | Status: AC | PRN
  Administered 2024-08-01: 100 mL via INTRAVENOUS

## 2024-08-01 MED ORDER — METRONIDAZOLE 500 MG/100ML IV SOLN
500.0000 mg | Freq: Two times a day (BID) | INTRAVENOUS | Status: DC
  Administered 2024-08-01 – 2024-08-03 (×5): 500 mg via INTRAVENOUS
  Filled 2024-08-01 (×7): qty 100

## 2024-08-01 MED ORDER — ONDANSETRON HCL 4 MG/2ML IJ SOLN: 4.0000 mg | Freq: Four times a day (QID) | INTRAMUSCULAR | Status: DC | PRN

## 2024-08-01 MED ORDER — VANCOMYCIN HCL IN DEXTROSE 1-5 GM/200ML-% IV SOLN: 1000.0000 mg | Freq: Once | INTRAVENOUS | Status: DC

## 2024-08-01 MED ORDER — LABETALOL HCL 5 MG/ML IV SOLN: 20.0000 mg | INTRAVENOUS | Status: DC | PRN

## 2024-08-01 MED ORDER — ASPIRIN 81 MG PO TBEC
81.0000 mg | DELAYED_RELEASE_TABLET | Freq: Every day | ORAL | Status: DC
  Administered 2024-08-01: 81 mg via ORAL
  Filled 2024-08-01: qty 1

## 2024-08-01 MED ORDER — VITAMIN D 25 MCG (1000 UNIT) PO TABS
1000.0000 [IU] | ORAL_TABLET | Freq: Every day | ORAL | Status: DC
  Administered 2024-08-01: 1000 [IU] via ORAL
  Filled 2024-08-01: qty 1

## 2024-08-01 MED ORDER — OXYCODONE-ACETAMINOPHEN 5-325 MG PO TABS
1.0000 | ORAL_TABLET | Freq: Four times a day (QID) | ORAL | Status: DC | PRN
  Administered 2024-08-01: 1 via ORAL
  Filled 2024-08-01: qty 1

## 2024-08-01 MED ORDER — SODIUM CHLORIDE 0.9 % IV SOLN
2.0000 g | Freq: Two times a day (BID) | INTRAVENOUS | Status: DC
  Administered 2024-08-01: 2 g via INTRAVENOUS
  Filled 2024-08-01: qty 12.5

## 2024-08-01 MED ORDER — SODIUM CHLORIDE 0.9 % IV SOLN
2.0000 g | Freq: Three times a day (TID) | INTRAVENOUS | Status: DC
  Administered 2024-08-01 – 2024-08-02 (×3): 2 g via INTRAVENOUS
  Filled 2024-08-01 (×3): qty 12.5

## 2024-08-01 MED ORDER — ISOSORBIDE MONONITRATE ER 30 MG PO TB24
15.0000 mg | ORAL_TABLET | Freq: Every day | ORAL | Status: DC
  Administered 2024-08-01 – 2024-08-03 (×3): 15 mg via ORAL
  Filled 2024-08-01 (×3): qty 1

## 2024-08-01 MED ORDER — CLOPIDOGREL BISULFATE 75 MG PO TABS
75.0000 mg | ORAL_TABLET | Freq: Every day | ORAL | Status: DC
  Administered 2024-08-01: 75 mg via ORAL
  Filled 2024-08-01: qty 1

## 2024-08-01 MED ORDER — ROSUVASTATIN CALCIUM 10 MG PO TABS
10.0000 mg | ORAL_TABLET | Freq: Every day | ORAL | Status: DC
  Administered 2024-08-01: 10 mg via ORAL
  Filled 2024-08-01: qty 1

## 2024-08-01 MED ORDER — HYDRALAZINE HCL 10 MG PO TABS
20.0000 mg | ORAL_TABLET | Freq: Three times a day (TID) | ORAL | Status: DC
  Administered 2024-08-01 – 2024-08-03 (×6): 20 mg via ORAL
  Filled 2024-08-01 (×6): qty 2

## 2024-08-01 MED ORDER — ONDANSETRON HCL 4 MG PO TABS: 4.0000 mg | ORAL_TABLET | Freq: Four times a day (QID) | ORAL | Status: DC | PRN

## 2024-08-01 MED ORDER — LACTATED RINGERS IV SOLN: INTRAVENOUS | Status: AC

## 2024-08-01 MED ORDER — METOPROLOL SUCCINATE ER 50 MG PO TB24
50.0000 mg | ORAL_TABLET | Freq: Every day | ORAL | Status: DC
  Administered 2024-08-01: 50 mg via ORAL
  Filled 2024-08-01: qty 1

## 2024-08-01 MED ORDER — LISINOPRIL 20 MG PO TABS
40.0000 mg | ORAL_TABLET | Freq: Every day | ORAL | Status: DC
  Administered 2024-08-01 – 2024-08-03 (×3): 40 mg via ORAL
  Filled 2024-08-01: qty 2
  Filled 2024-08-01: qty 4
  Filled 2024-08-01: qty 2

## 2024-08-01 MED ORDER — ACETAMINOPHEN 650 MG RE SUPP: 650.0000 mg | Freq: Four times a day (QID) | RECTAL | Status: DC | PRN

## 2024-08-01 MED ORDER — VANCOMYCIN HCL 2000 MG/400ML IV SOLN
2000.0000 mg | Freq: Once | INTRAVENOUS | Status: AC
  Administered 2024-08-01: 2000 mg via INTRAVENOUS
  Filled 2024-08-01: qty 400

## 2024-08-01 MED ORDER — ENOXAPARIN SODIUM 40 MG/0.4ML IJ SOSY
40.0000 mg | PREFILLED_SYRINGE | INTRAMUSCULAR | Status: DC
  Administered 2024-08-01 – 2024-08-03 (×3): 40 mg via SUBCUTANEOUS
  Filled 2024-08-01 (×3): qty 0.4

## 2024-08-01 MED ORDER — VITAMIN C 500 MG PO TABS
500.0000 mg | ORAL_TABLET | Freq: Every day | ORAL | Status: DC
  Administered 2024-08-01: 500 mg via ORAL
  Filled 2024-08-01: qty 1

## 2024-08-01 MED ORDER — VANCOMYCIN HCL 1500 MG/300ML IV SOLN: 1500.0000 mg | INTRAVENOUS | Status: DC

## 2024-08-01 MED ORDER — METOPROLOL SUCCINATE ER 50 MG PO TB24
75.0000 mg | ORAL_TABLET | Freq: Every day | ORAL | Status: DC
  Administered 2024-08-02 – 2024-08-03 (×2): 75 mg via ORAL
  Filled 2024-08-01 (×2): qty 1

## 2024-08-01 MED ORDER — HYDRALAZINE HCL 20 MG/ML IJ SOLN
10.0000 mg | Freq: Four times a day (QID) | INTRAMUSCULAR | Status: DC | PRN
  Administered 2024-08-01: 10 mg via INTRAVENOUS
  Filled 2024-08-01: qty 1

## 2024-08-01 NOTE — Consult Note (Signed)
 Subjective:   CC: Acute appendicitis  HPI:  Gregory Humphrey is a 83 y.o. male who is consulted by [facility] for evaluation of  above cc.  Symptoms were first noted 1 day ago. Pain is sharp right lower quadrant associated with shaking and chills, exacerbated by specific.  Pain was delayed presentation per wife who is the primary historian secondary to patient memory issues.     Past Medical History:  has a past medical history of Diabetes mellitus without complication (HCC), Gilbert's syndrome, Hypertension, Kidney stones, Olecranon bursitis, Seizures (HCC), Stroke (HCC), and Subdural hematoma (HCC).  Past Surgical History:  has a past surgical history that includes Refractive surgery (11/10/1993); Craniotomy (10/08/2011); PEG placement (10/16/2011); Cardiac catheterization (N/A, 12/05/2016); and Cardiac catheterization (N/A, 12/05/2016).  Family History: family history includes Alcohol abuse in his brother; Cancer in his father; Diabetes in his maternal uncle and mother; Hypertension in his mother; Stroke in his maternal grandfather and mother.  Social History:  reports that he has never smoked. He has never used smokeless tobacco. He reports that he does not drink alcohol and does not use drugs.  Current Medications:  Prior to Admission medications   Medication Sig Start Date End Date Taking? Authorizing Provider  acetaminophen  (TYLENOL ) 500 MG tablet Take 500-1,000 mg by mouth See admin instructions. Take 2 tablets (1000mg ) by mouth every morning and take 1 tablet (500mg ) by mouth every night   Yes [provider]  aspirin  EC 81 MG EC tablet Take 1 tablet (81 mg total) by mouth daily. 12/06/16  Yes Laurence Bridegroom, MD  cholecalciferol  (VITAMIN D3) 25 MCG (1000 UNIT) tablet Take 1,000 Units by mouth daily with lunch.   Yes [provider]  clopidogrel  (PLAVIX ) 75 MG tablet Take 75 mg by mouth daily. 07/20/24  Yes [provider]  hydrALAZINE  (APRESOLINE ) 10 MG tablet Take  20 mg by mouth 3 (three) times daily with meals.   Yes [provider]  isosorbide  mononitrate (IMDUR ) 30 MG 24 hr tablet Take 15 mg by mouth daily with breakfast.   Yes [provider]  lamoTRIgine  (LAMICTAL ) 25 MG tablet Take 75 mg by mouth daily with breakfast.   Yes [provider]  lisinopril  (PRINIVIL ,ZESTRIL ) 40 MG tablet Take 40 mg by mouth daily.   Yes [provider]  metFORMIN (GLUCOPHAGE-XR) 500 MG 24 hr tablet Take 500 mg by mouth daily with supper.   Yes [provider]  metoprolol  succinate (TOPROL -XL) 50 MG 24 hr tablet Take 50 mg by mouth daily with breakfast.   Yes [provider]  Multiple Vitamin (MULTIVITAMIN) tablet Take 1 tablet by mouth daily with lunch.   Yes [provider]  rosuvastatin  (CRESTOR ) 10 MG tablet Take 10 mg by mouth daily with supper.   Yes [provider]  vitamin C  (ASCORBIC ACID ) 500 MG tablet Take 500 mg by mouth daily with lunch.   Yes [provider]    Allergies:  Allergies as of 07/31/2024   (No Known Allergies)    ROS:  Pertinent negative and positives noted in HPI   Objective:     BP 128/77 (BP Location: Right Arm)   Pulse 75   Temp 98.4 F (36.9 C) (Oral)   Resp 18   Ht 5' 10 (1.778 m)   Wt 81.6 kg   SpO2 97%   BMI 25.83 kg/m    Constitutional :  alert, cooperative, appears stated age, and no distress  Respiratory:  Clear to auscultation bilaterally  Cardiovascular:  Regular rate and rhythm  Gastrointestinal: Soft, no guarding, mild tenderness to palpation right lower quadrant.   Skin: Cool and moist  Psychiatric: Normal affect, non-agitated, not confused       LABS:     Latest Ref Rng & Units 08/01/2024    5:03 AM 07/31/2024    8:32 PM 01/12/2023   12:14 PM  CMP  Glucose 70 - 99 mg/dL 826  832  851   BUN 8 - 23 mg/dL 16  22  20    Creatinine 0.61 - 1.24 mg/dL 9.07  8.85  8.95   Sodium 135 - 145 mmol/L 134  137  137   Potassium 3.5 - 5.1  mmol/L 3.8  3.6  3.8   Chloride 98 - 111 mmol/L 103  103  106   CO2 22 - 32 mmol/L 21  21  23    Calcium  8.9 - 10.3 mg/dL 8.8  9.5  9.3   Total Protein 6.5 - 8.1 g/dL  7.4    Total Bilirubin 0.0 - 1.2 mg/dL  1.3    Alkaline Phos 38 - 126 U/L  50    AST 15 - 41 U/L  29    ALT 0 - 44 U/L  36        Latest Ref Rng & Units 08/01/2024    5:03 AM 07/31/2024    8:32 PM 01/12/2023   12:14 PM  CBC  WBC 4.0 - 10.5 K/uL 10.6  5.9  4.7   Hemoglobin 13.0 - 17.0 g/dL 85.1  84.2  84.2   Hematocrit 39.0 - 52.0 % 41.9  45.2  44.6   Platelets 150 - 400 K/uL 143  129  145      RADS: CLINICAL DATA:  Bowel obstruction suspected   EXAM: CT ABDOMEN AND PELVIS WITH CONTRAST   TECHNIQUE: Multidetector CT imaging of the abdomen and pelvis was performed using the standard protocol following bolus administration of intravenous contrast.   RADIATION DOSE REDUCTION: This exam was performed according to the departmental dose-optimization program which includes automated exposure control, adjustment of the mA and/or kV according to patient size and/or use of iterative reconstruction technique.   CONTRAST:  OMNIPAQUE  IOHEXOL  300 MG/ML  SOLN   COMPARISON:  CT 09/17/2017   FINDINGS: Lower chest: Lung bases are clear.   Hepatobiliary: No focal hepatic lesion. Several 5-10 mm gallstones within lumen the gallbladder. No gallbladder inflammation. Common bile duct normal caliber. No biliary duct dilatation. Common bile duct is normal.   Pancreas: Pancreas is normal. No ductal dilatation. No pancreatic inflammation.   Spleen: Normal spleen   Adrenals/urinary tract: Adrenal glands and kidneys are normal. The ureters and bladder normal.   Stomach/Bowel: Stomach, duodenum proximal small bowel normal. The distal small bowel is fluid-filled but not significantly dilated measuring up to 2.2 cm. Terminal ileum is normal. No caliber significant caliber change.   The appendix is mildly dilated at 11  mm (image 64/2. There is enhancement of the wall of the appendix which measures 3 mm in thickness. There is small amount of fluid inflammation within the inferior RIGHT pericolic gutter adjacent to the appendix (image 61/2). Findings are most consistent acute appendicitis. No perforation or abscess.   Appendix in typical location extending inferior from the cecum in the iliac fossa.   The colon and rectosigmoid colon are normal. Multiple diverticula of the descending colon and sigmoid colon without acute inflammation.   Vascular/Lymphatic: Abdominal aorta is normal caliber. No periportal or retroperitoneal  adenopathy. No pelvic adenopathy.   Reproductive: Unremarkable   Other: No intraperitoneal free air   Musculoskeletal: No aggressive osseous lesion.   IMPRESSION: 1. Findings most consistent with acute appendicitis. No perforation or abscess. 2. Fluid-filled distal small bowel is favored mild ileus. 3. Cholelithiasis without evidence cholecystitis. 4. LEFT colon diverticulosis without evidence diverticulitis.     Electronically Signed   By: Jackquline Boxer M.D.   On: 08/01/2024 12:08   Assessment:      Acute appendicitis as noted on CT above.  Delayed presentation of abdominal pain but the new onset pain location is consistent with appendicitis.  Initial presenting symptoms likely stemming from the appendicitis as well.  Plan:      Discussed the risk of surgery including post-op infxn, seroma, hematoma, abscess formation, chronic pain, poor-delayed wound healing, possible bowel resection, possible ostomy, possible conversion to open procedure, post-op SBO or ileus, and need for additional procedures to address said risks.  The risks of general anesthetic including MI, CVA, sudden death or even reaction to anesthetic medications also discussed. Alternatives include continued observation, or antibiotic treatment.  Benefits include possible symptom relief,   Typical post  operative recovery of 3-5 days rest, also discussed.  The patient and wife understands the risks, any and all questions were answered to the patient's satisfaction.  Patient currently receiving Plavix .  Will hold for the next couple days prior to proceeding with appendectomy.  Will keep n.p.o. just for tonight to ensure that antibiotics prevent any further worsening of symptoms.  Continue chronic medical management per hospitalist team.  labs/images/medications/previous chart entries reviewed personally and relevant changes/updates noted above.

## 2024-08-01 NOTE — Assessment & Plan Note (Addendum)
 The patient converted with IV adenosine . Toprol  increased to 75 mg daily.  Patient's sinus 69 bpm in the morning of discharge.

## 2024-08-01 NOTE — Assessment & Plan Note (Signed)
-   Will continue antihypertensive therapy.

## 2024-08-01 NOTE — ED Notes (Signed)
 Pt called out stating that he was having RLQ pain after urinating. Pt tender in both RUQ and RLQ. Provider notified. PRN tylenol  given.

## 2024-08-01 NOTE — Progress Notes (Signed)
 Pharmacy Antibiotic Note  Gregory Humphrey is a 83 y.o. male admitted on 07/31/2024 with infection of unknown source.  Pharmacy has been consulted for Cefepime  and Vancomycin  dosing for 7 days.  Plan: Adjust vancomycin  to 1750 mg IV Q24H. Goal AUC 400-550. Expected AUC: 510.3 Expected Css min: 10.9 SCr used: 0.92  Weight used: IBW, Vd used: 0.72 (BMI 25.83) Adjust cefepime  to 2 g IV Q8H Patient is also on metronidazole  500 mg IV Q12H Continue to monitor renal function and follow culture results   Pharmacy will continue to follow and will adjust abx dosing whenever warranted.  Temp (24hrs), Avg:98.9 F (37.2 C), Min:97.8 F (36.6 C), Max:99.5 F (37.5 C)   Recent Labs  Lab 07/31/24 2032 07/31/24 2348 08/01/24 0503  WBC 5.9  --  10.6*  CREATININE 1.14  --  0.92  LATICACIDVEN 2.3* 1.9  --     Estimated Creatinine Clearance: 65 mL/min (by C-G formula based on SCr of 0.92 mg/dL).    No Known Allergies  Antimicrobials this admission: 9/21 Ceftriaxone  >> x 1 dose 9/22 Cefepime  >> x 7 days 9/22 Flagyl  >> x 7 days 9/22 Vancomycin  >> x 7 days  Microbiology results: 9/21 Respiratory panel IP 9/21 UA: negative leukocytes/nitrites, no bacteria, WBC 0-5 9/21 Bcx IP  Thank you for allowing pharmacy to be a part of this patient's care.  Lum Mania, PharmD, BCPS 08/01/2024 10:22 AM

## 2024-08-01 NOTE — H&P (Addendum)
 [place]   PATIENT NAME: Gregory Humphrey    MR#:  [id number]  DATE OF BIRTH:  22-Oct-1941  DATE OF ADMISSION:  07/31/2024  PRIMARY CARE PHYSICIAN: Lenon Layman ORN, MD   Patient is coming from: Home  REQUESTING/REFERRING PHYSICIAN: Clarine Sharper, MD  CHIEF COMPLAINT:   Chief Complaint  Patient presents with   Hypertension    HISTORY OF PRESENT ILLNESS:  Gregory Humphrey is a 83 y.o. male with medical history significant for type 2 diabetes mellitus, Bertrum syndrome, hypertension, seizure disorder, CVA and history of subdural hematoma, who presented to the emergency room with acute onset of shivering all over with cold chills per his wife and elevated blood pressure that was up to 227/111 and later 203/108.  The patient denied any headache or dizziness or blurred vision, paresthesias or focal muscle weakness.  He was noted to have low-grade fever of 100.3.  He has been having nausea and indigestion vomiting or abdominal pain or diarrhea or melena or bright red bleeding per rectum.  He denied any chest pain or palpitations.  He has been having dyspnea with the symptoms and denied any cough or wheezing.  No dysuria, oliguria or hematuria or flank pain.  ED Course: When he came to the ER, BP was 146/89 with otherwise normal vital signs.  Labs revealed a blood glucose of 167 with CO2 of 21, total bili 1.3 otherwise unremarkable CMP.  High sensitive troponin I was 8 and later 17.  Lactic acid was 2.3 and 0.9.  CBC showed thrombocytopenia 129.  PT and INR were normal.  Urinalysis showed 150 glucose 100 protein and was otherwise unremarkable.  Blood cultures were drawn as well as respiratory panel. EKG as reviewed by me : EKG showed SVT with a rate of 174 and ST segment depression laterally and anterolateral septally. After IV adenosine  EKG showed sinus tachycardia with rate 111 with prolonged PR interval. Imaging: Portable chest x-ray showed no acute cardiopulmonary disease.  The  patient was given 2 g of IV Rocephin , 1 L bolus of IV normal saline in addition to IV adenosine .  He will be admitted to a progressive unit bed for further evaluation and management. PAST MEDICAL HISTORY:   Past Medical History:  Diagnosis Date   Diabetes mellitus without complication (HCC)    Gilbert's syndrome    Hypertension    Kidney stones    Olecranon bursitis    Seizures (HCC)    Stroke (HCC)    Subdural hematoma (HCC)     PAST SURGICAL HISTORY:   Past Surgical History:  Procedure Laterality Date   CARDIAC CATHETERIZATION N/A 12/05/2016   Procedure: Left Heart Cath and Coronary Angiography;  Surgeon: Vinie DELENA Jude, MD;  Location: [facility] INVASIVE CV LAB;  Service: Cardiovascular;  Laterality: N/A;   CARDIAC CATHETERIZATION N/A 12/05/2016   Procedure: Coronary Stent Intervention;  Surgeon: Deatrice DELENA Cage, MD;  Location: [facility] INVASIVE CV LAB;  Service: Cardiovascular;  Laterality: N/A;   CRANIOTOMY  10/08/2011   Procedure: CRANIOTOMY HEMATOMA EVACUATION SUBDURAL;  Surgeon: Fairy JONETTA Levels, MD;  Location: [facility] NEURO ORS;  Service: Neurosurgery;  Laterality: Left;  Left Craniotomy for subdural   PEG PLACEMENT  10/16/2011   Procedure: PERCUTANEOUS ENDOSCOPIC GASTROSTOMY (PEG) PLACEMENT;  Surgeon: Gordy Starch, MD;  Location: [facility] ENDOSCOPY;  Service: Gastroenterology;  Laterality: N/A;   REFRACTIVE SURGERY  11/10/1993    SOCIAL HISTORY:   Social History   Tobacco Use   Smoking status: Never  Smokeless tobacco: Never  Substance Use Topics   Alcohol use: No    Alcohol/week: 0.0 standard drinks of alcohol    FAMILY HISTORY:   Family History  Problem Relation Age of Onset   Stroke Mother    Hypertension Mother    Diabetes Mother    Cancer Father        bone   Alcohol abuse Brother        past   Diabetes Maternal Uncle    Stroke Maternal Grandfather    Prostate cancer Neg Hx    Bladder Cancer Neg Hx    Kidney cancer Neg Hx     DRUG ALLERGIES:  No Known  Allergies  REVIEW OF SYSTEMS:   ROS As per history of present illness. All pertinent systems were reviewed above. Constitutional, HEENT, cardiovascular, respiratory, GI, GU, musculoskeletal, neuro, psychiatric, endocrine, integumentary and hematologic systems were reviewed and are otherwise negative/unremarkable except for positive findings mentioned above in the HPI.   MEDICATIONS AT HOME:   Prior to Admission medications   Medication Sig Start Date End Date Taking? Authorizing Provider  acetaminophen  (TYLENOL ) 500 MG tablet Take 500-1,000 mg by mouth See admin instructions. Take 2 tablets (1000mg ) by mouth every morning and take 1 tablet (500mg ) by mouth every night   Yes [provider]  aspirin  EC 81 MG EC tablet Take 1 tablet (81 mg total) by mouth daily. 12/06/16  Yes Laurence Bridegroom, MD  cholecalciferol  (VITAMIN D3) 25 MCG (1000 UNIT) tablet Take 1,000 Units by mouth daily with lunch.   Yes [provider]  clopidogrel  (PLAVIX ) 75 MG tablet Take 75 mg by mouth daily. 07/20/24  Yes [provider]  hydrALAZINE  (APRESOLINE ) 10 MG tablet Take 20 mg by mouth 3 (three) times daily with meals.   Yes [provider]  isosorbide  mononitrate (IMDUR ) 30 MG 24 hr tablet Take 15 mg by mouth daily with breakfast.   Yes [provider]  lamoTRIgine  (LAMICTAL ) 25 MG tablet Take 75 mg by mouth daily with breakfast.   Yes [provider]  lisinopril  (PRINIVIL ,ZESTRIL ) 40 MG tablet Take 40 mg by mouth daily.   Yes [provider]  metFORMIN (GLUCOPHAGE-XR) 500 MG 24 hr tablet Take 500 mg by mouth daily with supper.   Yes [provider]  metoprolol  succinate (TOPROL -XL) 50 MG 24 hr tablet Take 50 mg by mouth daily with breakfast.   Yes [provider]  Multiple Vitamin (MULTIVITAMIN) tablet Take 1 tablet by mouth daily with lunch.   Yes [provider]  rosuvastatin  (CRESTOR ) 10 MG tablet Take 10 mg by mouth daily with  supper.   Yes [provider]  vitamin C  (ASCORBIC ACID ) 500 MG tablet Take 500 mg by mouth daily with lunch.   Yes [provider]      VITAL SIGNS:  Blood pressure (!) 144/84, pulse 81, temperature 97.8 F (36.6 C), temperature source Oral, resp. rate 18, height 5' 10 (1.778 m), weight 81.6 kg, SpO2 95%.  PHYSICAL EXAMINATION:  Physical Exam  GENERAL:  83 y.o.-year-old Caucasian male patient lying in the bed with no acute distress.  EYES: Pupils equal, round, reactive to light and accommodation. No scleral icterus. Extraocular muscles intact.  HEENT: Head atraumatic, normocephalic. Oropharynx and nasopharynx clear.  NECK:  Supple, no jugular venous distention. No thyroid enlargement, no tenderness.  LUNGS: Normal breath sounds bilaterally, no wheezing, rales,rhonchi or crepitation. No use of accessory muscles of respiration.  CARDIOVASCULAR: Regular rate and rhythm, S1,  S2 normal. No murmurs, rubs, or gallops.  ABDOMEN: Soft, nondistended, nontender. Bowel sounds present. No organomegaly or mass.  EXTREMITIES: No pedal edema, cyanosis, or clubbing.  NEUROLOGIC: Cranial nerves II through XII are intact. Muscle strength 5/5 in all extremities. Sensation intact. Gait not checked.  PSYCHIATRIC: The patient is alert and oriented x 3.  Normal affect and good eye contact. SKIN: No obvious rash, lesion, or ulcer.   LABORATORY PANEL:   CBC Recent Labs  Lab 07/31/24 2032  WBC 5.9  HGB 15.7  HCT 45.2  PLT 129*   ------------------------------------------------------------------------------------------------------------------  Chemistries  Recent Labs  Lab 07/31/24 2032  NA 137  K 3.6  CL 103  CO2 21*  GLUCOSE 167*  BUN 22  CREATININE 1.14  CALCIUM  9.5  AST 29  ALT 36  ALKPHOS 50  BILITOT 1.3*   ------------------------------------------------------------------------------------------------------------------  Cardiac Enzymes No results for input(s):  TROPONINI in the last 168 hours. ------------------------------------------------------------------------------------------------------------------  RADIOLOGY:  DG Chest Portable 1 View Result Date: 07/31/2024 EXAM: 1 VIEW XRAY OF THE CHEST 07/31/2024 09:22:00 PM COMPARISON: 12/04/2016 CLINICAL HISTORY: Fever. Pt comes with HTN, nausea and the shakes. Pt states no cp or sob. Pt states he was at church and got home and this started. Pt takes bp meds and has taken them today. Wife reports the bp readings were in the 200s at home. Wife also states his sugars have been little elevated. Pt is diabetic. Pt now stating he was having trouble getting out bed. Wife states the pt is now getting confused. The pt repeats Im getting confused. Wife reports hx of stroke and brain bleed in past. FINDINGS: LUNGS AND PLEURA: No focal pulmonary opacity. No pulmonary edema. No pleural effusion. No pneumothorax. HEART AND MEDIASTINUM: No acute abnormality of the cardiac and mediastinal silhouettes. Thoracic aortic atherosclerosis. BONES AND SOFT TISSUES: No acute osseous abnormality. Defibrillator pad overlying the left hemithorax. IMPRESSION: 1. No acute abnormalities. Electronically signed by: Pinkie Pebbles MD 07/31/2024 09:25 PM EDT RP Workstation: [id number]   CT HEAD WO CONTRAST ( ) Result Date: 07/31/2024 CLINICAL DATA:  Confusion EXAM: CT HEAD WITHOUT CONTRAST TECHNIQUE: Contiguous axial images were obtained from the base of the skull through the vertex without intravenous contrast. RADIATION DOSE REDUCTION: This exam was performed according to the departmental dose-optimization program which includes automated exposure control, adjustment of the mA and/or kV according to patient size and/or use of iterative reconstruction technique. COMPARISON:  01/11/2023 FINDINGS: Brain: Chronic extra-axial collection noted under the left craniotomy defect measuring approximately 5 mm in thickness, unchanged. No underlying mass  effect or midline shift. There is atrophy and chronic small vessel disease changes. Encephalomalacia within the inferior left frontal and temporal lobes, stable. No acute intracranial abnormality. Specifically, no hemorrhage, hydrocephalus, mass lesion, acute infarction, or significant intracranial injury. Vascular: No hyperdense vessel or unexpected calcification. Skull: Prior left temporal craniotomy. No acute calvarial abnormality. Sinuses/Orbits: No acute findings Other: None IMPRESSION: Atrophy, chronic microvascular disease. No acute intracranial abnormality. Stable inferior left frontal and temporal encephalomalacia. Stable small low-density extra-axial fluid collection underlying the left side craniotomy defect. Electronically Signed   By: Franky Crease M.D.   On: 07/31/2024 20:07      IMPRESSION AND PLAN:  Assessment and Plan: * PSVT (paroxysmal supraventricular tachycardia) (HCC) - The patient converted with IV adenosine . - He will be admitted to a progressive unit observation bed. - Will follow serial troponins. - Monitor for further arrhythmias. - Will optimize electrolytes. - Will obtain TSH level and check magnesium . -  2D echo and cardiology consult will be obtained. - I notified Dr. Custovic about the patient.  Essential hypertension - Will continue antihypertensive therapy.  Coronary artery disease - Will continue aspirin  and Plavix   Type 2 diabetes mellitus without complications (HCC) - The patient will be placed on supplemental coverage with NovoLog .  Dyslipidemia - Will continue statin therapy.     DVT prophylaxis: Lovenox .  Advanced Care Planning:  Code Status: He is DNR and DNI. Family Communication:  The plan of care was discussed in details with the patient (and family). I answered all questions. The patient agreed to proceed with the above mentioned plan. Further management will depend upon hospital course. Disposition Plan: Back to previous home  environment Consults called: Cardiology All the records are reviewed and case discussed with ED provider.  Status is: Inpatient  At the time of the admission, it appears that the appropriate admission status for this patient is inpatient.  This is judged to be reasonable and necessary in order to provide the required intensity of service to ensure the patient's safety given the presenting symptoms, physical exam findings and initial radiographic and laboratory data in the context of comorbid conditions.  The patient requires inpatient status due to high intensity of service, high risk of further deterioration and high frequency of surveillance required.  I certify that at the time of admission, it is my clinical judgment that the patient will require inpatient hospital care extending more than 2 midnights.                            Dispo: The patient is from: Home              Anticipated d/c is to: Home              Patient currently is not medically stable to d/c.              Difficult to place patient: No  Madison DELENA Peaches M.D on 08/01/2024 at 4:27 AM  [facility]   From 7 PM-7 AM, contact night-coverage [email or web address]  CC: Primary care physician; Lenon Layman ORN, MD

## 2024-08-01 NOTE — Progress Notes (Signed)
*  PRELIMINARY RESULTS* Echocardiogram 2D Echocardiogram has been performed.  Floydene Harder 08/01/2024, 2:36 PM

## 2024-08-01 NOTE — ED Notes (Signed)
 Sent message to MD regarding CT results/diet order. No new orders at this time.

## 2024-08-01 NOTE — Assessment & Plan Note (Signed)
 On Crestor 

## 2024-08-01 NOTE — ED Notes (Signed)
 Informed patient/spouse diet changed to NPO/sips with meds; no dinner tray will be brought. Patient/spouse verbalized understanding.

## 2024-08-01 NOTE — Progress Notes (Addendum)
 PROGRESS NOTE    Gregory Humphrey  FMW:[id number] DOB: 1941/07/15 DOA: 07/31/2024 PCP: Lenon Layman ORN, MD   Assessment & Plan:   Principal Problem:   PSVT (paroxysmal supraventricular tachycardia) (HCC) Active Problems:   Essential hypertension   Coronary artery disease   Type 2 diabetes mellitus without complications (HCC)   Dyslipidemia  Assessment and Plan:  PSVT: s/p IV adenosine . Increased home dose of metoprolol . Echo ordered. Continue on tele. Cardio following and recs apprec   Abd pain: secondary to acute appendicitis & possible ileus as per CT abd/pelvis. Gen surg consulted. NPO   HTN: continue on metoprolol     Hx of CAD: continue on aspirin , plavix , metoprolol .    DM2: likely well controlled. Continue on SSI w/ accuchecks  HLD: continue on statin   Thrombocytopenia: etiology unclear. Will continue to monitor   DVT prophylaxis: lovenox   Code Status: DNR Family Communication: discussed pt's care w/ pt's family at bedside and answered their questions Disposition Plan: depends on PT/OT recs (not consulted yet)   Level of care: Progressive  Status is: Inpatient Remains inpatient appropriate because: severity of illness    Consultants:  Cardio   Procedures:   Antimicrobials:   Subjective: Pt c/o abd pain   Objective: Vitals:   08/01/24 0730 08/01/24 0800 08/01/24 0820 08/01/24 0845  BP: (!) 157/86     Pulse: 88 99    Resp: (!) 21   20  Temp:   99.3 F (37.4 C)   TempSrc:   Oral   SpO2: 94%     Weight:      Height:        Intake/Output Summary (Last 24 hours) at 08/01/2024 0938 Last data filed at 08/01/2024 0809 Gross per 24 hour  Intake --  Output 200 ml  Net -200 ml   Filed Weights   08/01/24 0317  Weight: 81.6 kg    Examination:  General exam: Appears calm and comfortable  Respiratory system: Clear to auscultation. Respiratory effort normal. Cardiovascular system: S1 & S2 +. No rubs, gallops or clicks. Gastrointestinal  system: Abdomen is nondistended, soft and tenderness to palpation. Hypoactive bowel sounds heard. Central nervous system: Alert and oriented. Moves all extremities Psychiatry: Judgement and insight appear normal. Mood & affect appropriate.     Data Reviewed: I have personally reviewed following labs and imaging studies  CBC: Recent Labs  Lab 07/31/24 2032 08/01/24 0503  WBC 5.9 10.6*  NEUTROABS 5.6  --   HGB 15.7 14.8  HCT 45.2 41.9  MCV 92.8 93.5  PLT 129* 143*   Basic Metabolic Panel: Recent Labs  Lab 07/31/24 2032 08/01/24 0503  NA 137 134*  K 3.6 3.8  CL 103 103  CO2 21* 21*  GLUCOSE 167* 173*  BUN 22 16  CREATININE 1.14 0.92  CALCIUM  9.5 8.8*  MG  --  2.1   GFR: Estimated Creatinine Clearance: 65 mL/min (by C-G formula based on SCr of 0.92 mg/dL). Liver Function Tests: Recent Labs  Lab 07/31/24 2032  AST 29  ALT 36  ALKPHOS 50  BILITOT 1.3*  PROT 7.4  ALBUMIN 4.4   No results for input(s): LIPASE, AMYLASE in the last 168 hours. No results for input(s): AMMONIA in the last 168 hours. Coagulation Profile: Recent Labs  Lab 07/31/24 2032 08/01/24 0503  INR 1.0 1.1   Cardiac Enzymes: No results for input(s): CKTOTAL, CKMB, CKMBINDEX, TROPONINI in the last 168 hours. BNP (last 3 results) No results for input(s): PROBNP in the last  8760 hours. HbA1C: No results for input(s): HGBA1C in the last 72 hours. CBG: No results for input(s): GLUCAP in the last 168 hours. Lipid Profile: No results for input(s): CHOL, HDL, LDLCALC, TRIG, CHOLHDL, LDLDIRECT in the last 72 hours. Thyroid Function Tests: No results for input(s): TSH, T4TOTAL, FREET4, T3FREE, THYROIDAB in the last 72 hours. Anemia Panel: No results for input(s): VITAMINB12, FOLATE, FERRITIN, TIBC, IRON, RETICCTPCT in the last 72 hours. Sepsis Labs: Recent Labs  Lab 07/31/24 2032 07/31/24 2348  LATICACIDVEN 2.3* 1.9    Recent Results  (from the past 240 hours)  Resp panel by RT-PCR (RSV, Flu A&B, Covid) Anterior Nasal Swab     Status: None   Collection Time: 07/31/24  8:32 PM   Specimen: Anterior Nasal Swab  Result Value Ref Range Status   SARS Coronavirus 2 by RT PCR NEGATIVE NEGATIVE Final    Comment: (NOTE) SARS-CoV-2 target nucleic acids are NOT DETECTED.  The SARS-CoV-2 RNA is generally detectable in upper respiratory specimens during the acute phase of infection. The lowest concentration of SARS-CoV-2 viral copies this assay can detect is 138 copies/mL. A negative result does not preclude SARS-Cov-2 infection and should not be used as the sole basis for treatment or other patient management decisions. A negative result may occur with  improper specimen collection/handling, submission of specimen other than nasopharyngeal swab, presence of viral mutation(s) within the areas targeted by this assay, and inadequate number of viral copies(<138 copies/mL). A negative result must be combined with clinical observations, patient history, and epidemiological information. The expected result is Negative.  Fact Sheet for Patients:  BloggerCourse.com  Fact Sheet for Healthcare Providers:  SeriousBroker.it  This test is no t yet approved or cleared by the [place]  FDA and  has been authorized for detection and/or diagnosis of SARS-CoV-2 by FDA under an Emergency Use Authorization (EUA). This EUA will remain  in effect (meaning this test can be used) for the duration of the COVID-19 declaration under Section 564(b)(1) of the Act, 21 U.S.C.section 360bbb-3(b)(1), unless the authorization is terminated  or revoked sooner.       Influenza A by PCR NEGATIVE NEGATIVE Final   Influenza B by PCR NEGATIVE NEGATIVE Final    Comment: (NOTE) The Xpert Xpress SARS-CoV-2/FLU/RSV plus assay is intended as an aid in the diagnosis of influenza from Nasopharyngeal swab  specimens and should not be used as a sole basis for treatment. Nasal washings and aspirates are unacceptable for Xpert Xpress SARS-CoV-2/FLU/RSV testing.  Fact Sheet for Patients: BloggerCourse.com  Fact Sheet for Healthcare Providers: SeriousBroker.it  This test is not yet approved or cleared by the [place]  FDA and has been authorized for detection and/or diagnosis of SARS-CoV-2 by FDA under an Emergency Use Authorization (EUA). This EUA will remain in effect (meaning this test can be used) for the duration of the COVID-19 declaration under Section 564(b)(1) of the Act, 21 U.S.C. section 360bbb-3(b)(1), unless the authorization is terminated or revoked.     Resp Syncytial Virus by PCR NEGATIVE NEGATIVE Final    Comment: (NOTE) Fact Sheet for Patients: BloggerCourse.com  Fact Sheet for Healthcare Providers: SeriousBroker.it  This test is not yet approved or cleared by the [place]  FDA and has been authorized for detection and/or diagnosis of SARS-CoV-2 by FDA under an Emergency Use Authorization (EUA). This EUA will remain in effect (meaning this test can be used) for the duration of the COVID-19 declaration under Section 564(b)(1) of the Act, 21 U.S.C. section 360bbb-3(b)(1), unless the  authorization is terminated or revoked.  Performed at [facility], [address] [place], [place] [address]   Blood culture (routine x 2)     Status: None (Preliminary result)   Collection Time: 07/31/24 11:48 PM   Specimen: BLOOD  Result Value Ref Range Status   Specimen Description BLOOD RIGHT ANTECUBITAL  Final   Special Requests   Final    BOTTLES DRAWN AEROBIC AND ANAEROBIC Blood Culture results may not be optimal due to an inadequate volume of blood received in culture bottles   Culture   Final    NO GROWTH < 12 HOURS Performed at [facility], [address] [place], [place] [address]    Report Status PENDING  Incomplete  Blood culture (routine x 2)     Status: None (Preliminary result)   Collection Time: 07/31/24 11:48 PM   Specimen: BLOOD  Result Value Ref Range Status   Specimen Description BLOOD LEFT ANTECUBITAL  Final   Special Requests   Final    BOTTLES DRAWN AEROBIC AND ANAEROBIC Blood Culture adequate volume   Culture   Final    NO GROWTH < 12 HOURS Performed at [facility], [address], [place], [place] [address]    Report Status PENDING  Incomplete         Radiology Studies: DG Abd 1 View Result Date: 08/01/2024 CLINICAL DATA:  [id number] Abdominal pain [id number] EXAM: ABDOMEN - 1 VIEW COMPARISON:  09/17/2017 FINDINGS: A couple of gas-filled, mildly dilated segments of small bowel noted in the left upper quadrant, measuring up to 3.1 cm. The right lower quadrant small bowel appears decompressed.No pneumoperitoneum. No organomegaly or radiopaque calculi. No acute fracture or destructive lesion. Multilevel degenerative disc disease of the spine. IMPRESSION: A couple of gas-filled, mildly dilated segments of small bowel noted in the left upper quadrant measuring up to 3.1 cm. If there is clinical concern for underlying small-bowel obstruction, a follow-up CT of the abdomen and pelvis with IV contrast would be recommended for further characterization. Alternatively, serial KUB follow-up could be considered. Electronically Signed   By: Rogelia Myers M.D.   On: 08/01/2024 08:45   US  Abdomen Limited RUQ (LIVER/GB) Result Date: 08/01/2024 CLINICAL DATA:  [id number].  Right upper quadrant pain. EXAM: ULTRASOUND ABDOMEN LIMITED RIGHT UPPER QUADRANT COMPARISON:  CT without contrast 09/17/2017. FINDINGS: Gallbladder: There are layering shadowing stones in the gallbladder, largest measured is 1.5 cm. There is no wall thickening, positive sonographic Murphy's sign or pericholecystic fluid. Common bile duct: Diameter: Obscured by  bowel gas and by diffuse attenuation from the liver. Liver: No focal lesion identified. The liver is diffusely attenuating and echogenic. This is usually due to hepatic steatosis. Portal vein is patent on color Doppler imaging with normal direction of blood flow towards the liver. Other: No right upper quadrant ascites. IMPRESSION: 1. Cholelithiasis without evidence of acute cholecystitis. 2. Common bile duct is obscured by bowel gas and by diffuse attenuation from the liver. 3. Diffusely attenuating and echogenic liver, usually due to hepatic steatosis. Electronically Signed   By: Francis Quam M.D.   On: 08/01/2024 06:32   DG Chest Portable 1 View Result Date: 07/31/2024 EXAM: 1 VIEW XRAY OF THE CHEST 07/31/2024 09:22:00 PM COMPARISON: 12/04/2016 CLINICAL HISTORY: Fever. Pt comes with HTN, nausea and the shakes. Pt states no cp or sob. Pt states he was at church and got home and this started. Pt takes bp meds and has taken them today. Wife reports the bp  readings were in the 200s at home. Wife also states his sugars have been little elevated. Pt is diabetic. Pt now stating he was having trouble getting out bed. Wife states the pt is now getting confused. The pt repeats Im getting confused. Wife reports hx of stroke and brain bleed in past. FINDINGS: LUNGS AND PLEURA: No focal pulmonary opacity. No pulmonary edema. No pleural effusion. No pneumothorax. HEART AND MEDIASTINUM: No acute abnormality of the cardiac and mediastinal silhouettes. Thoracic aortic atherosclerosis. BONES AND SOFT TISSUES: No acute osseous abnormality. Defibrillator pad overlying the left hemithorax. IMPRESSION: 1. No acute abnormalities. Electronically signed by: Pinkie Pebbles MD 07/31/2024 09:25 PM EDT RP Workstation: [id number]   CT HEAD WO CONTRAST ( ) Result Date: 07/31/2024 CLINICAL DATA:  Confusion EXAM: CT HEAD WITHOUT CONTRAST TECHNIQUE: Contiguous axial images were obtained from the base of the skull through the vertex  without intravenous contrast. RADIATION DOSE REDUCTION: This exam was performed according to the departmental dose-optimization program which includes automated exposure control, adjustment of the mA and/or kV according to patient size and/or use of iterative reconstruction technique. COMPARISON:  01/11/2023 FINDINGS: Brain: Chronic extra-axial collection noted under the left craniotomy defect measuring approximately 5 mm in thickness, unchanged. No underlying mass effect or midline shift. There is atrophy and chronic small vessel disease changes. Encephalomalacia within the inferior left frontal and temporal lobes, stable. No acute intracranial abnormality. Specifically, no hemorrhage, hydrocephalus, mass lesion, acute infarction, or significant intracranial injury. Vascular: No hyperdense vessel or unexpected calcification. Skull: Prior left temporal craniotomy. No acute calvarial abnormality. Sinuses/Orbits: No acute findings Other: None IMPRESSION: Atrophy, chronic microvascular disease. No acute intracranial abnormality. Stable inferior left frontal and temporal encephalomalacia. Stable small low-density extra-axial fluid collection underlying the left side craniotomy defect. Electronically Signed   By: Franky Crease M.D.   On: 07/31/2024 20:07        Scheduled Meds:  ascorbic acid   500 mg Oral Q lunch   aspirin  EC  81 mg Oral Daily   cholecalciferol   1,000 Units Oral Q lunch   clopidogrel   75 mg Oral Daily   enoxaparin  (LOVENOX ) injection  40 mg Subcutaneous Q24H   hydrALAZINE   20 mg Oral TID WC   isosorbide  mononitrate  15 mg Oral Q breakfast   lamoTRIgine   75 mg Oral Q breakfast   lisinopril   40 mg Oral Daily   [START ON 08/02/2024] metoprolol  succinate  75 mg Oral Q breakfast   multivitamin with minerals  1 tablet Oral Q lunch   rosuvastatin   10 mg Oral Q supper   Continuous Infusions:  ceFEPime  (MAXIPIME ) IV Stopped (08/01/24 0759)   lactated ringers      lactated ringers  75 mL/hr at  08/01/24 0805   metronidazole  Stopped (08/01/24 0452)   [START ON 08/02/2024] vancomycin        LOS: 1 day       Anthony CHRISTELLA Pouch, MD [facility] Pager [phone]  If 7PM-7AM, please contact night-coverage [email or web address] 08/01/2024, 9:38 AM

## 2024-08-01 NOTE — Progress Notes (Signed)
 Pharmacy Antibiotic Note  Gregory Humphrey is a 83 y.o. male admitted on 07/31/2024 with infection of unknown source.  Pharmacy has been consulted for Cefepime  and Vancomycin  dosing for 7 days.  Plan: Cefepime  2 gm q12hr per indication & renal fxn.  Pt given Vancomycin  2000 mg once. Vancomycin  1500 mg IV Q 24 hrs. Goal AUC 400-550. Expected AUC: 532.4 SCr used: 1.14  Pharmacy will continue to follow and will adjust abx dosing whenever warranted.  Temp (24hrs), Avg:98.6 F (37 C), Min:97.8 F (36.6 C), Max:99.3 F (37.4 C)   Recent Labs  Lab 07/31/24 2032 07/31/24 2348  WBC 5.9  --   CREATININE 1.14  --   LATICACIDVEN 2.3* 1.9    Estimated Creatinine Clearance: 52.5 mL/min (by C-G formula based on SCr of 1.14 mg/dL).    No Known Allergies  Antimicrobials this admission: 9/21 Ceftriaxone  >> x 1 dose 9/22 Cefepime  >> x 7 days 9/22 Flagyl  >> x 7 days 9/22 Vancomycin  >> x 7 days  Microbiology results: 9/21 BCx: Pending  Thank you for allowing pharmacy to be a part of this patient's care.  Gregory Humphrey, PharmD, MBA 08/01/2024 3:24 AM

## 2024-08-01 NOTE — ED Notes (Signed)
 RN to room for rounding; patient in CT

## 2024-08-01 NOTE — Consult Note (Signed)
 [facility] CLINIC CARDIOLOGY CONSULT NOTE       Patient ID: Gregory Humphrey MRN: [id number] DOB/AGE: 15-Jan-1941 83 y.o.  Admit date: 07/31/2024 Referring Physician Dr. Madison Peaches Primary Physician Lenon Layman ORN, MD  Primary Cardiologist Dr. Florencio Reason for Consultation PSVT  HPI: Gregory Humphrey is a 83 y.o. male  with a past medical history of CAD s/p DES to mid LAD 2018, hx CVA and subdural hematoma, hypertension, hyperlipidemia, type II diabetes, seizure disorder who presented to the ED on 07/31/2024 for chills, significantly elevated BP. EKG on admission showed SVT and he converted with adenosine . Cardiology was consulted for further evaluation.   History obtained via patient's wife due to his baseline cognitive impairment. Wife reports that yesterday evening when they got home from church he was noted to be shivering, was not febrile at home. Also found to have significantly elevated BP so she brought him to the ED for further evaluation. Workup in the ED notable for creatinine 1.14, potassium 3.6, hemoglobin 15.7, WBC 5.9. Troponins 8 > 17. EKG in the ED SVT rate 174 bpm. CXR and CT head without acute abnormalities. Received adenosine  6 mg x1 in the ED with conversion to NSR.   At the time of my evaluation he is resting in hospital bed with wife present at bedside. Denies any episodes of CP, wife states he occasionally states he is SOB but this is not noticeably limiting to her. Denies any episodes of palpitations. Maintaining in NSR since receiving adenosine  yesterday. Low grade fevers noted since admission, unclear cause at this time.   Review of systems complete and found to be negative unless listed above    Past Medical History:  Diagnosis Date   Diabetes mellitus without complication (HCC)    Gilbert's syndrome    Hypertension    Kidney stones    Olecranon bursitis    Seizures (HCC)    Stroke (HCC)    Subdural hematoma (HCC)     Past Surgical History:   Procedure Laterality Date   CARDIAC CATHETERIZATION N/A 12/05/2016   Procedure: Left Heart Cath and Coronary Angiography;  Surgeon: Vinie DELENA Jude, MD;  Location: [facility] INVASIVE CV LAB;  Service: Cardiovascular;  Laterality: N/A;   CARDIAC CATHETERIZATION N/A 12/05/2016   Procedure: Coronary Stent Intervention;  Surgeon: Deatrice DELENA Cage, MD;  Location: [facility] INVASIVE CV LAB;  Service: Cardiovascular;  Laterality: N/A;   CRANIOTOMY  10/08/2011   Procedure: CRANIOTOMY HEMATOMA EVACUATION SUBDURAL;  Surgeon: Fairy JONETTA Levels, MD;  Location: [facility] NEURO ORS;  Service: Neurosurgery;  Laterality: Left;  Left Craniotomy for subdural   PEG PLACEMENT  10/16/2011   Procedure: PERCUTANEOUS ENDOSCOPIC GASTROSTOMY (PEG) PLACEMENT;  Surgeon: Gordy Starch, MD;  Location: [facility] ENDOSCOPY;  Service: Gastroenterology;  Laterality: N/A;   REFRACTIVE SURGERY  11/10/1993    (Not in a hospital admission)  Social History   Socioeconomic History   Marital status: Married    Spouse name: Not on file   Number of children: Not on file   Years of education: Not on file   Highest education level: Not on file  Occupational History   Occupation: Past Charity fundraiser    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[employer]  Tobacco Use   Smoking status: Never   Smokeless tobacco: Never  Vaping Use   Vaping status: Never Used  Substance and Sexual Activity   Alcohol use: No    Alcohol/week: 0.0 standard drinks of alcohol   Drug use: No   Sexual activity: Never  Other Topics  Concern   Not on file  Social History Narrative   Married, lives with wife, adopted son   Social Drivers of Corporate investment banker Strain: Low Risk  (05/17/2024)   Received from [facility]   Overall Financial Resource Strain (CARDIA)    Difficulty of Paying Living Expenses: Not hard at all  Food Insecurity: No Food Insecurity (05/17/2024)   Received from [facility]   Hunger Vital Sign    Within the past 12 months, you  worried that your food would run out before you got the money to buy more.: Never true    Within the past 12 months, the food you bought just didn't last and you didn't have money to get more.: Never true  Transportation Needs: No Transportation Needs (05/17/2024)   Received from [facility] - Transportation    In the past 12 months, has lack of transportation kept you from medical appointments or from getting medications?: No    Lack of Transportation (Non-Medical): No  Physical Activity: Not on file  Stress: Not on file  Social Connections: Not on file  Intimate Partner Violence: Not At Risk (01/11/2023)   Humiliation, Afraid, Rape, and Kick questionnaire    Fear of Current or Ex-Partner: No    Emotionally Abused: No    Physically Abused: No    Sexually Abused: No    Family History  Problem Relation Age of Onset   Stroke Mother    Hypertension Mother    Diabetes Mother    Cancer Father        bone   Alcohol abuse Brother        past   Diabetes Maternal Uncle    Stroke Maternal Grandfather    Prostate cancer Neg Hx    Bladder Cancer Neg Hx    Kidney cancer Neg Hx      Vitals:   08/01/24 0630 08/01/24 0703 08/01/24 0726 08/01/24 0730  BP: (!) 176/91  (!) 166/90 (!) 157/86  Pulse: 86  87 88  Resp: (!) 25   (!) 21  Temp:  99.5 F (37.5 C)    TempSrc:  Oral    SpO2: 94%   94%  Weight:      Height:        PHYSICAL EXAM General: Well appearing elderly male, well nourished, in no acute distress. HEENT: Normocephalic and atraumatic. Neck: No JVD.  Lungs: Normal respiratory effort on room air. Clear bilaterally to auscultation. No wheezes, crackles, rhonchi.  Heart: HRRR. Normal S1 and S2 without gallops or murmurs.  Abdomen: Non-distended appearing.  Msk: Normal strength and tone for age. Extremities: Warm and well perfused. No clubbing, cyanosis. No edema.  Neuro: Alert and oriented X 3. Psych: Answers questions appropriately.   Labs: Basic  Metabolic Panel: Recent Labs    07/31/24 2032 08/01/24 0503  NA 137 134*  K 3.6 3.8  CL 103 103  CO2 21* 21*  GLUCOSE 167* 173*  BUN 22 16  CREATININE 1.14 0.92  CALCIUM  9.5 8.8*  MG  --  2.1   Liver Function Tests: Recent Labs    07/31/24 2032  AST 29  ALT 36  ALKPHOS 50  BILITOT 1.3*  PROT 7.4  ALBUMIN 4.4   No results for input(s): LIPASE, AMYLASE in the last 72 hours. CBC: Recent Labs    07/31/24 2032 08/01/24 0503  WBC 5.9 10.6*  NEUTROABS 5.6  --   HGB 15.7  14.8  HCT 45.2 41.9  MCV 92.8 93.5  PLT 129* 143*   Cardiac Enzymes: Recent Labs    07/31/24 2032 07/31/24 2348  TROPONINIHS 8 17   BNP: No results for input(s): BNP in the last 72 hours. D-Dimer: No results for input(s): DDIMER in the last 72 hours. Hemoglobin A1C: No results for input(s): HGBA1C in the last 72 hours. Fasting Lipid Panel: No results for input(s): CHOL, HDL, LDLCALC, TRIG, CHOLHDL, LDLDIRECT in the last 72 hours. Thyroid Function Tests: No results for input(s): TSH, T4TOTAL, T3FREE, THYROIDAB in the last 72 hours.  Invalid input(s): FREET3 Anemia Panel: No results for input(s): VITAMINB12, FOLATE, FERRITIN, TIBC, IRON, RETICCTPCT in the last 72 hours.   Radiology: US  Abdomen Limited RUQ (LIVER/GB) Result Date: 08/01/2024 CLINICAL DATA:  [id number].  Right upper quadrant pain. EXAM: ULTRASOUND ABDOMEN LIMITED RIGHT UPPER QUADRANT COMPARISON:  CT without contrast 09/17/2017. FINDINGS: Gallbladder: There are layering shadowing stones in the gallbladder, largest measured is 1.5 cm. There is no wall thickening, positive sonographic Murphy's sign or pericholecystic fluid. Common bile duct: Diameter: Obscured by bowel gas and by diffuse attenuation from the liver. Liver: No focal lesion identified. The liver is diffusely attenuating and echogenic. This is usually due to hepatic steatosis. Portal vein is patent on color Doppler imaging with normal  direction of blood flow towards the liver. Other: No right upper quadrant ascites. IMPRESSION: 1. Cholelithiasis without evidence of acute cholecystitis. 2. Common bile duct is obscured by bowel gas and by diffuse attenuation from the liver. 3. Diffusely attenuating and echogenic liver, usually due to hepatic steatosis. Electronically Signed   By: Francis Quam M.D.   On: 08/01/2024 06:32   DG Chest Portable 1 View Result Date: 07/31/2024 EXAM: 1 VIEW XRAY OF THE CHEST 07/31/2024 09:22:00 PM COMPARISON: 12/04/2016 CLINICAL HISTORY: Fever. Pt comes with HTN, nausea and the shakes. Pt states no cp or sob. Pt states he was at church and got home and this started. Pt takes bp meds and has taken them today. Wife reports the bp readings were in the 200s at home. Wife also states his sugars have been little elevated. Pt is diabetic. Pt now stating he was having trouble getting out bed. Wife states the pt is now getting confused. The pt repeats Im getting confused. Wife reports hx of stroke and brain bleed in past. FINDINGS: LUNGS AND PLEURA: No focal pulmonary opacity. No pulmonary edema. No pleural effusion. No pneumothorax. HEART AND MEDIASTINUM: No acute abnormality of the cardiac and mediastinal silhouettes. Thoracic aortic atherosclerosis. BONES AND SOFT TISSUES: No acute osseous abnormality. Defibrillator pad overlying the left hemithorax. IMPRESSION: 1. No acute abnormalities. Electronically signed by: Pinkie Pebbles MD 07/31/2024 09:25 PM EDT RP Workstation: [id number]   CT HEAD WO CONTRAST ( ) Result Date: 07/31/2024 CLINICAL DATA:  Confusion EXAM: CT HEAD WITHOUT CONTRAST TECHNIQUE: Contiguous axial images were obtained from the base of the skull through the vertex without intravenous contrast. RADIATION DOSE REDUCTION: This exam was performed according to the departmental dose-optimization program which includes automated exposure control, adjustment of the mA and/or kV according to patient size and/or  use of iterative reconstruction technique. COMPARISON:  01/11/2023 FINDINGS: Brain: Chronic extra-axial collection noted under the left craniotomy defect measuring approximately 5 mm in thickness, unchanged. No underlying mass effect or midline shift. There is atrophy and chronic small vessel disease changes. Encephalomalacia within the inferior left frontal and temporal lobes, stable. No acute intracranial abnormality. Specifically, no hemorrhage, hydrocephalus, mass lesion, acute infarction, or  significant intracranial injury. Vascular: No hyperdense vessel or unexpected calcification. Skull: Prior left temporal craniotomy. No acute calvarial abnormality. Sinuses/Orbits: No acute findings Other: None IMPRESSION: Atrophy, chronic microvascular disease. No acute intracranial abnormality. Stable inferior left frontal and temporal encephalomalacia. Stable small low-density extra-axial fluid collection underlying the left side craniotomy defect. Electronically Signed   By: Franky Crease M.D.   On: 07/31/2024 20:07    ECHO ordered  TELEMETRY reviewed by me 08/01/2024: not on tele  EKG reviewed by me: SVT rate 174 bpm  Data reviewed by me 08/01/2024: last 24h vitals tele labs imaging I/O ED provider note, admission H&P  Principal Problem:   PSVT (paroxysmal supraventricular tachycardia) (HCC) Active Problems:   Coronary artery disease   Dyslipidemia   Type 2 diabetes mellitus without complications (HCC)   Essential hypertension    ASSESSMENT AND PLAN:  DEVARIUS NELLES is a 83 y.o. male  with a past medical history of CAD s/p DES to mid LAD 2018, hx CVA and subdural hematoma, hypertension, hyperlipidemia, type II diabetes, seizure disorder who presented to the ED on 07/31/2024 for chills, significantly elevated BP. EKG on admission showed SVT and he converted with adenosine . Cardiology was consulted for further evaluation.   # Supraventricular tachycardia # CAD s/p DES to mid LAD 2018 #  Hypertension Patient with hx of CAD and hypertension presented with concerns of chills/elevated BP. While in the ED he went into SVT with HR in the 170s and received one dose of adenosine  with successful conversion to NSR. Has been noted to have low grade fevers since admission, no clear cause identified as of yet.  -Echo ordered.  -Increase home metoprolol  succinate to 75 mg daily.  -Continue to treat causes of increased adrenergic tone including fever, infection, hypoxia, pain, etc.   -Continue home lisinopril , imdur , hydralazine .  -Continue home crestor , aspirin , plavix .    This patient's plan of care was discussed and created with Dr. Wilburn and he is in agreement.  Signed: Danita Bloch, PA-C  08/01/2024, 7:41 AM [facility] Card[facility]

## 2024-08-01 NOTE — Assessment & Plan Note (Signed)
 Can go back on Glucophage in a couple days

## 2024-08-01 NOTE — ED Notes (Signed)
 Admitting  at bedside.

## 2024-08-01 NOTE — Assessment & Plan Note (Signed)
 Cleared to go back on Plavix 

## 2024-08-02 ENCOUNTER — Encounter
Admission: EM | Disposition: A | Discharge status: Home / Self Care | Payer: Self-pay | Source: Home / Self Care | Attending: Internal Medicine

## 2024-08-02 ENCOUNTER — Other Ambulatory Visit: Payer: Self-pay

## 2024-08-02 ENCOUNTER — Encounter: Payer: Self-pay | Admitting: Family Medicine

## 2024-08-02 ENCOUNTER — Inpatient Hospital Stay: Admitting: Certified Registered"

## 2024-08-02 DIAGNOSIS — I471 Supraventricular tachycardia, unspecified: Secondary | ICD-10-CM | POA: Diagnosis not present

## 2024-08-02 HISTORY — PX: XI ROBOTIC LAPAROSCOPIC ASSISTED APPENDECTOMY: SHX6877

## 2024-08-02 LAB — BASIC METABOLIC PANEL WITH GFR
Anion gap: 9 (ref 5–15)
BUN: 17 mg/dL (ref 8–23)
CO2: 20 mmol/L — ABNORMAL LOW (ref 22–32)
Calcium: 8.7 mg/dL — ABNORMAL LOW (ref 8.9–10.3)
Chloride: 107 mmol/L (ref 98–111)
Creatinine, Ser: 0.98 mg/dL (ref 0.61–1.24)
GFR, Estimated: >60 mL/min (ref >60)
Glucose, Bld: 177 mg/dL — ABNORMAL HIGH (ref 70–99)
Potassium: 3.9 mmol/L (ref 3.5–5.1)
Sodium: 136 mmol/L (ref 135–145)

## 2024-08-02 LAB — CBC
HCT: 40.1 % (ref 39.0–52.0)
Hemoglobin: 13.9 g/dL (ref 13.0–17.0)
MCH: 32.6 pg (ref 26.0–34.0)
MCHC: 34.7 g/dL (ref 30.0–36.0)
MCV: 94.1 fL (ref 80.0–100.0)
Platelets: 113 K/uL — ABNORMAL LOW (ref 150–400)
RBC: 4.26 MIL/uL (ref 4.22–5.81)
RDW: 13.4 % (ref 11.5–15.5)
WBC: 8.1 K/uL (ref 4.0–10.5)
nRBC: 0 % (ref 0.0–0.2)

## 2024-08-02 LAB — GLUCOSE, CAPILLARY
Glucose-Capillary: 166 mg/dL — ABNORMAL HIGH (ref 70–99)
Glucose-Capillary: 166 mg/dL — ABNORMAL HIGH (ref 70–99)

## 2024-08-02 LAB — MAGNESIUM: Magnesium: 2.1 mg/dL (ref 1.7–2.4)

## 2024-08-02 SURGERY — APPENDECTOMY, ROBOT-ASSISTED, LAPAROSCOPIC
Anesthesia: General

## 2024-08-02 MED ORDER — ACETAMINOPHEN 10 MG/ML IV SOLN
INTRAVENOUS | Status: DC | PRN
Start: 2024-08-02 — End: 2024-08-02
  Administered 2024-08-02: 1000 mg via INTRAVENOUS

## 2024-08-02 MED ORDER — BUPIVACAINE-EPINEPHRINE (PF) 0.5% -1:200000 IJ SOLN
INTRAMUSCULAR | Status: AC
  Filled 2024-08-02: qty 30

## 2024-08-02 MED ORDER — SUGAMMADEX SODIUM 200 MG/2ML IV SOLN
INTRAVENOUS | Status: DC | PRN
  Administered 2024-08-02: 200 mg via INTRAVENOUS

## 2024-08-02 MED ORDER — FENTANYL CITRATE (PF) 100 MCG/2ML IJ SOLN
INTRAMUSCULAR | Status: DC | PRN
  Administered 2024-08-02: 50 ug via INTRAVENOUS
  Administered 2024-08-02 (×2): 25 ug via INTRAVENOUS

## 2024-08-02 MED ORDER — FENTANYL CITRATE (PF) 100 MCG/2ML IJ SOLN: 25.0000 ug | INTRAMUSCULAR | Status: DC | PRN

## 2024-08-02 MED ORDER — ROCURONIUM BROMIDE 100 MG/10ML IV SOLN
INTRAVENOUS | Status: DC | PRN
  Administered 2024-08-02: 50 mg via INTRAVENOUS

## 2024-08-02 MED ORDER — BUPIVACAINE-EPINEPHRINE (PF) 0.5% -1:200000 IJ SOLN
INTRAMUSCULAR | Status: DC | PRN
  Administered 2024-08-02: 30 mL

## 2024-08-02 MED ORDER — SODIUM CHLORIDE 0.9 % IV SOLN
2.0000 g | Freq: Every day | INTRAVENOUS | Status: DC
  Filled 2024-08-02 (×2): qty 20

## 2024-08-02 MED ORDER — SODIUM CHLORIDE 0.9 % IV SOLN: INTRAVENOUS | Status: DC | PRN

## 2024-08-02 MED ORDER — ONDANSETRON HCL 4 MG/2ML IJ SOLN
INTRAMUSCULAR | Status: DC | PRN
  Administered 2024-08-02: 4 mg via INTRAVENOUS

## 2024-08-02 MED ORDER — 0.9 % SODIUM CHLORIDE (POUR BTL) OPTIME
TOPICAL | Status: DC | PRN
  Administered 2024-08-02: 500 mL

## 2024-08-02 MED ORDER — PROPOFOL 10 MG/ML IV BOLUS
INTRAVENOUS | Status: DC | PRN
  Administered 2024-08-02: 100 mg via INTRAVENOUS

## 2024-08-02 MED ORDER — OXYCODONE HCL 5 MG/5ML PO SOLN: 5.0000 mg | Freq: Once | ORAL | Status: DC | PRN

## 2024-08-02 MED ORDER — FENTANYL CITRATE (PF) 100 MCG/2ML IJ SOLN
INTRAMUSCULAR | Status: AC
  Filled 2024-08-02: qty 2

## 2024-08-02 MED ORDER — LIDOCAINE HCL (CARDIAC) PF 100 MG/5ML IV SOSY
PREFILLED_SYRINGE | INTRAVENOUS | Status: DC | PRN
  Administered 2024-08-02: 80 mg via INTRAVENOUS

## 2024-08-02 MED ORDER — OXYCODONE HCL 5 MG PO TABS: 5.0000 mg | ORAL_TABLET | Freq: Once | ORAL | Status: DC | PRN

## 2024-08-02 MED ORDER — DEXAMETHASONE SODIUM PHOSPHATE 10 MG/ML IJ SOLN
INTRAMUSCULAR | Status: DC | PRN
  Administered 2024-08-02: 5 mg via INTRAVENOUS

## 2024-08-02 MED ORDER — ACETAMINOPHEN 10 MG/ML IV SOLN
INTRAVENOUS | Status: AC
Start: 2024-08-02 — End: 2024-08-02
  Filled 2024-08-02: qty 100

## 2024-08-02 SURGICAL SUPPLY — 44 items
BAG PRESSURE INF REUSE 1000 (BAG) IMPLANT
CANNULA REDUCER 12-8 DVNC XI (CANNULA) ×1 IMPLANT
COVER TIP SHEARS 8 DVNC (MISCELLANEOUS) ×1 IMPLANT
DEFOGGER SCOPE WARM SEASHARP (MISCELLANEOUS) ×1 IMPLANT
DERMABOND ADVANCED .7 DNX12 (GAUZE/BANDAGES/DRESSINGS) ×1 IMPLANT
DRAPE ARM DVNC X/XI (DISPOSABLE) ×4 IMPLANT
DRAPE COLUMN DVNC XI (DISPOSABLE) ×1 IMPLANT
ELECTRODE REM PT RTRN 9FT ADLT (ELECTROSURGICAL) ×1 IMPLANT
FORCEPS BPLR FENES DVNC XI (FORCEP) ×1 IMPLANT
GLOVE BIOGEL PI IND STRL 6.5 (GLOVE) ×2 IMPLANT
GLOVE SURG SYN 6.5 PF PI (GLOVE) ×4 IMPLANT
GOWN STRL REUS W/ TWL LRG LVL3 (GOWN DISPOSABLE) ×4 IMPLANT
GRASPER SUT TROCAR 14GX15 (MISCELLANEOUS) IMPLANT
GRASPER TIP-UP FEN DVNC XI (INSTRUMENTS) ×1 IMPLANT
IRRIGATOR SUCT 8 DISP DVNC XI (IRRIGATION / IRRIGATOR) IMPLANT
IV NS 1000ML BAXH (IV SOLUTION) IMPLANT
KIT PINK PAD W/HEAD ARM REST (MISCELLANEOUS) ×1 IMPLANT
LABEL OR SOLS (LABEL) IMPLANT
MANIFOLD NEPTUNE II (INSTRUMENTS) ×1 IMPLANT
NDL DRIVE SUT CUT DVNC (INSTRUMENTS) ×1 IMPLANT
NDL HYPO 22X1.5 SAFETY MO (MISCELLANEOUS) ×1 IMPLANT
NDL INSUFFLATION 14GA 120MM (NEEDLE) ×1 IMPLANT
NEEDLE DRIVE SUT CUT DVNC (INSTRUMENTS) ×1 IMPLANT
NEEDLE HYPO 22X1.5 SAFETY MO (MISCELLANEOUS) ×1 IMPLANT
NEEDLE INSUFFLATION 14GA 120MM (NEEDLE) ×1 IMPLANT
OBTURATOR OPTICALSTD 8 DVNC (TROCAR) ×1 IMPLANT
PACK LAP CHOLECYSTECTOMY (MISCELLANEOUS) ×1 IMPLANT
RELOAD STAPLE 45 2.5 WHT DVNC (STAPLE) IMPLANT
RELOAD STAPLE 45 3.5 BLU DVNC (STAPLE) IMPLANT
SCISSORS MNPLR CVD DVNC XI (INSTRUMENTS) ×1 IMPLANT
SEAL UNIV 5-12 XI (MISCELLANEOUS) ×4 IMPLANT
SEALER VESSEL EXT DVNC XI (MISCELLANEOUS) IMPLANT
SET TUBE SMOKE EVAC HIGH FLOW (TUBING) ×1 IMPLANT
SOLUTION ELECTROSURG ANTI STCK (MISCELLANEOUS) ×1 IMPLANT
SPONGE T-LAP 4X18 ~~LOC~~+RFID (SPONGE) ×1 IMPLANT
STAPLER 45 SUREFORM DVNC (STAPLE) IMPLANT
SUT MNCRL AB 4-0 PS2 18 (SUTURE) ×1 IMPLANT
SUT STRATA 3-0 SH (SUTURE) IMPLANT
SUT VICRYL 0 UR6 27IN ABS (SUTURE) ×1 IMPLANT
SYR 30ML LL (SYRINGE) ×1 IMPLANT
SYSTEM BAG RETRIEVAL 10MM (BASKET) ×1 IMPLANT
TRAP FLUID SMOKE EVACUATOR (MISCELLANEOUS) ×1 IMPLANT
TRAY FOLEY MTR SLVR 16FR STAT (SET/KITS/TRAYS/PACK) IMPLANT
WATER STERILE IRR 500ML POUR (IV SOLUTION) ×1 IMPLANT

## 2024-08-02 NOTE — Op Note (Signed)
 Pre-op Diagnosis: Acute appendicitis   Post op Diagnosis: Acute perforated appenditicis  Procedure: Robotic assisted laparoscopic appendectomy.  Anesthesia: GETA  Surgeon: Lucas Sjogren, MD, FACS  Wound Classification: Contaminated  Specimen: Appendix  Complications: None  Estimated Blood Loss: 3 mL   Indications: Patient is a 83 y.o. male  presented with above right lower quadrant pain. CT scan shows acute appendicitis.     FIndings: 1.  Contain perforation of appendix  2. No peri-appendiceal abscess or phlegmon 3. Normal anatomy 4. Adequate hemostasis.              Description of procedure: The patient was placed on the operating table in the supine position. General anesthesia was induced. A time-out was completed verifying correct patient, procedure, site, positioning, and implant(s) and/or special equipment prior to beginning this procedure. The abdomen was prepped and draped in the usual sterile fashion.   Palmer's point located and Veress needle was inserted.  After confirming 2 clicks and a positive saline drop test, gas insufflation was initiated until the abdominal pressure was measured at 15 mmHg.  Afterwards, the Veress needle was removed and a 8 mm port was placed in left upper quadrant area using Optiview technique.  After local was infused, 3 additional incision on the left abdominal wall were made 5 cm apart.  An 12 mm port and two other 8 mm ports were placed under direct visualization.  No injuries from trocar placements were noted.  The table was placed in the Trendelenburg position with the right side elevated.  With the use of Tip up grasper, fenestrated bipolar and monopolar scissors, an contained perforated appendix was identified and elevated.  Window created at base of appendix in the mesentery.    The mesoappendix was divided with combination of bipolar energy and monopolar scissors.  The base of the appendix was ligated with 3-0 Stratafix.   The appendix was divided with monopolar scissors.  A second layer of the 3-0 Stratafix was done over the appendiceal stump.  The appendiceal stump was examined and hemostasis noted. No other pathology was identified within pelvis. The 12 mm trocar removed and port site closed with PMI using 0 vicryl under direct vision. Remaining trocars were removed under direct vision. No bleeding was noted.The abdomen was allowed to collapse.  All skin incisions then closed with subcuticular sutures Monocryl 4-0.  Wounds then dressed with dermabond.  The patient tolerated the procedure well, awakened from anesthesia and was taken to the postanesthesia care unit in satisfactory condition.  Sponge count and instrument count correct at the end of the procedure.

## 2024-08-02 NOTE — Anesthesia Postprocedure Evaluation (Signed)
 Anesthesia Post Note  Patient: Gregory Humphrey  Procedure(s) Performed: APPENDECTOMY, ROBOT-ASSISTED, LAPAROSCOPIC  Patient location during evaluation: PACU Anesthesia Type: General Level of consciousness: awake and alert Pain management: pain level controlled Vital Signs Assessment: post-procedure vital signs reviewed and stable Respiratory status: spontaneous breathing, nonlabored ventilation, respiratory function stable and patient connected to nasal cannula oxygen Cardiovascular status: blood pressure returned to baseline and stable Postop Assessment: no apparent nausea or vomiting Anesthetic complications: no   No notable events documented.   Last Vitals:  Vitals:   08/02/24 1746 08/02/24 1759  BP: (!) 140/71   Pulse: 71 68  Resp: (!) 9 15  Temp:  36.9 C  SpO2: 95% 94%    Last Pain:  Vitals:   08/02/24 1759  TempSrc:   PainSc: 0-No pain                 Debby Mines

## 2024-08-02 NOTE — Progress Notes (Signed)
 [facility]- General Surgery  SURGICAL PROGRESS NOTE  Hospital Day(s): 2.   Interval History:  Patient seen and examined. Doing well overall. Pain has improved. Denies any other symptoms at this time, appears comfortable on exam.  Leukocytosis has resolved. Original plan was to hold Plavix  for a couple days before appendectomy. However Dr. Tye will be out of town for the rest of the week.Will discuss case with Dr. Cesar.  Wife at bedside is the primary historian for patient.   Vital signs in last 24 hours: [min-max] current  Temp:  [97.9 F (36.6 C)-99.6 F (37.6 C)] 98.1 F (36.7 C) (09/23 0735) Pulse Rate:  [72-88] 82 (09/23 0735) Resp:  [13-24] 17 (09/23 0735) BP: (115-159)/(64-98) 151/94 (09/23 0735) SpO2:  [93 %-98 %] 96 % (09/23 0735)     Height: 5' 10 (177.8 cm) Weight: 81.6 kg BMI (Calculated): 25.83   Intake/Output last 2 shifts:  09/22 0701 - 09/23 0700 In: 3040.4 [P.O.:1080; I.V.:1465.6; IV Piggyback:494.8] Out: 1100 [Urine:1100]   Physical Exam:  Constitutional: alert, cooperative and no distress  Respiratory: breathing non-labored at rest  Cardiovascular: regular rate and sinus rhythm  Gastrointestinal: soft, generalized tenderness, and non-distended  Labs:     Latest Ref Rng & Units 08/02/2024    3:53 AM 08/01/2024    5:03 AM 07/31/2024    8:32 PM  CBC  WBC 4.0 - 10.5 K/uL 8.1  10.6  5.9   Hemoglobin 13.0 - 17.0 g/dL 86.0  85.1  84.2   Hematocrit 39.0 - 52.0 % 40.1  41.9  45.2   Platelets 150 - 400 K/uL 113  143  129       Latest Ref Rng & Units 08/02/2024    3:53 AM 08/01/2024    5:03 AM 07/31/2024    8:32 PM  CMP  Glucose 70 - 99 mg/dL 822  826  832   BUN 8 - 23 mg/dL 17  16  22    Creatinine 0.61 - 1.24 mg/dL 9.01  9.07  8.85   Sodium 135 - 145 mmol/L 136  134  137   Potassium 3.5 - 5.1 mmol/L 3.9  3.8  3.6   Chloride 98 - 111 mmol/L 107  103  103   CO2 22 - 32 mmol/L 20  21  21    Calcium  8.9 - 10.3 mg/dL 8.7  8.8  9.5   Total Protein 6.5 -  8.1 g/dL   7.4   Total Bilirubin 0.0 - 1.2 mg/dL   1.3   Alkaline Phos 38 - 126 U/L   50   AST 15 - 41 U/L   29   ALT 0 - 44 U/L   36     Imaging studies: No new pertinent imaging studies   Assessment/Plan:  83 y.o. male with acute appendicitis, complicated by pertinent comorbidities including recent history of PSVT, essential hypertension, CAD, type 2 diabetes without complications, and history of TIA, and dyslipidemia.   - Discussed robotic assisted laparoscopic appendectomy with patient and wife at bedside. Wife agrees to proceed with surgery.  - Discussed case with Dr. Cesar since Dr. Tye will be out of town the rest of this week.  Dr. Cesar has agreed to do the surgery. Scheduled to have the surgery this afternoon.  - Continue IV Flagyl  for now  - Continue NPO for surgery  - Continue pain medication as needed  -- Kiosha Buchan Barrientos PA-C

## 2024-08-02 NOTE — Plan of Care (Signed)
   Problem: Fluid Volume: Goal: Hemodynamic stability will improve Outcome: Progressing   Problem: Clinical Measurements: Goal: Diagnostic test results will improve Outcome: Progressing Goal: Signs and symptoms of infection will decrease Outcome: Progressing

## 2024-08-02 NOTE — Transfer of Care (Signed)
 Immediate Anesthesia Transfer of Care Note  Patient: Gregory Humphrey  Procedure(s) Performed: APPENDECTOMY, ROBOT-ASSISTED, LAPAROSCOPIC  Patient Location: PACU  Anesthesia Type:General  Level of Consciousness: drowsy  Airway & Oxygen Therapy: Patient Spontanous Breathing and Patient connected to face mask oxygen  Post-op Assessment: Report given to RN  Post vital signs: stable  Last Vitals:  Vitals Value Taken Time  BP 149/76 08/02/24 17:27  Temp    Pulse 73 08/02/24 17:30  Resp 18 08/02/24 17:30  SpO2 99 % 08/02/24 17:30  Vitals shown include unfiled device data.  Last Pain:  Vitals:   08/02/24 1540  TempSrc: Temporal  PainSc: 4       Patients Stated Pain Goal: 0 (08/02/24 0255)  Complications: No notable events documented.

## 2024-08-02 NOTE — Progress Notes (Addendum)
 [facility] CLINIC CARDIOLOGY PROGRESS NOTE       Patient ID: Gregory Humphrey MRN: [id number] DOB/AGE: December 14, 1940 83 y.o.  Admit date: 07/31/2024 Referring Physician Dr. Madison Peaches Primary Physician Lenon Layman ORN, MD  Primary Cardiologist Dr. Florencio Reason for Consultation PSVT  HPI: Gregory Humphrey is a 83 y.o. male  with a past medical history of CAD s/p DES to mid LAD 2018, hx CVA and subdural hematoma, hypertension, hyperlipidemia, type II diabetes, seizure disorder who presented to the ED on 07/31/2024 for chills, significantly elevated BP. EKG on admission showed SVT and he converted with adenosine . Cardiology was consulted for further evaluation.   Interval history: -Patient seen and examined this AM, resting in bed with wife at bedside.  -Denies SOB, CP, palpitations. No recurrence of SVT on tele. BP and HR stable.  -CT abd yest with appendicitis. Surgery planning for appendectomy later today.  Review of systems complete and found to be negative unless listed above    Past Medical History:  Diagnosis Date   Diabetes mellitus without complication (HCC)    Gilbert's syndrome    Hypertension    Kidney stones    Olecranon bursitis    Seizures (HCC)    Stroke (HCC)    Subdural hematoma (HCC)     Past Surgical History:  Procedure Laterality Date   CARDIAC CATHETERIZATION N/A 12/05/2016   Procedure: Left Heart Cath and Coronary Angiography;  Surgeon: Vinie DELENA Jude, MD;  Location: [facility] INVASIVE CV LAB;  Service: Cardiovascular;  Laterality: N/A;   CARDIAC CATHETERIZATION N/A 12/05/2016   Procedure: Coronary Stent Intervention;  Surgeon: Deatrice DELENA Cage, MD;  Location: [facility] INVASIVE CV LAB;  Service: Cardiovascular;  Laterality: N/A;   CRANIOTOMY  10/08/2011   Procedure: CRANIOTOMY HEMATOMA EVACUATION SUBDURAL;  Surgeon: Fairy JONETTA Levels, MD;  Location: [facility] NEURO ORS;  Service: Neurosurgery;  Laterality: Left;  Left Craniotomy for subdural   PEG PLACEMENT  10/16/2011    Procedure: PERCUTANEOUS ENDOSCOPIC GASTROSTOMY (PEG) PLACEMENT;  Surgeon: Gordy Starch, MD;  Location: [facility] ENDOSCOPY;  Service: Gastroenterology;  Laterality: N/A;   REFRACTIVE SURGERY  11/10/1993    Medications Prior to Admission  Medication Sig Dispense Refill Last Dose/Taking   acetaminophen  (TYLENOL ) 500 MG tablet Take 500-1,000 mg by mouth See admin instructions. Take 2 tablets (1000mg ) by mouth every morning and take 1 tablet (500mg ) by mouth every night   Taking   aspirin  EC 81 MG EC tablet Take 1 tablet (81 mg total) by mouth daily. 30 tablet 2 07/31/2024   cholecalciferol  (VITAMIN D3) 25 MCG (1000 UNIT) tablet Take 1,000 Units by mouth daily with lunch.   07/31/2024   clopidogrel  (PLAVIX ) 75 MG tablet Take 75 mg by mouth daily.   07/31/2024   hydrALAZINE  (APRESOLINE ) 10 MG tablet Take 20 mg by mouth 3 (three) times daily with meals.   07/31/2024 Noon   isosorbide  mononitrate (IMDUR ) 30 MG 24 hr tablet Take 15 mg by mouth daily with breakfast.   07/31/2024   lamoTRIgine  (LAMICTAL ) 25 MG tablet Take 75 mg by mouth daily with breakfast.   07/31/2024   lisinopril  (PRINIVIL ,ZESTRIL ) 40 MG tablet Take 40 mg by mouth daily.   07/31/2024   metFORMIN (GLUCOPHAGE-XR) 500 MG 24 hr tablet Take 500 mg by mouth daily with supper.   07/30/2024   metoprolol  succinate (TOPROL -XL) 50 MG 24 hr tablet Take 50 mg by mouth daily with breakfast.   07/31/2024   Multiple Vitamin (MULTIVITAMIN) tablet Take 1 tablet by mouth daily with  lunch.   07/31/2024   rosuvastatin  (CRESTOR ) 10 MG tablet Take 10 mg by mouth daily with supper.   07/31/2024   vitamin C  (ASCORBIC ACID ) 500 MG tablet Take 500 mg by mouth daily with lunch.   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  Tobacco Use   Smoking status: Never   Smokeless tobacco: Never  Vaping Use   Vaping status: Never Used  Substance and Sexual Activity   Alcohol use: No    Alcohol/week: 0.0 standard drinks of alcohol   Drug use: No   Sexual activity: Never  Other Topics Concern   Not on file  Social History Narrative   Married, lives with wife, adopted son   Social Drivers of Health   Financial Resource Strain: Low Risk  (05/17/2024)   Received from [facility]   Overall Financial Resource Strain (CARDIA)    Difficulty of Paying Living Expenses: Not hard at all  Food Insecurity: No Food Insecurity (08/01/2024)   Hunger Vital Sign    Worried About Running Out of Food in the Last Year: Never true    Ran Out of Food in the Last Year: Never true  Transportation Needs: No Transportation Needs (08/01/2024)   PRAPARE - Administrator, Civil Service (Medical): No    Lack of Transportation (Non-Medical): No  Physical Activity: Not on file  Stress: Not on file  Social Connections: Socially Integrated (08/01/2024)   Social Connection and Isolation Panel    Frequency of Communication with Friends and Family: Three times a week    Frequency of Social Gatherings with Friends and Family: Three times a week    Attends Religious Services: More than 4 times per year    Active Member of Clubs or Organizations: Yes    Attends Banker Meetings: More than 4 times per year    Marital Status: Married  Catering manager Violence: Not At Risk (08/01/2024)   Humiliation, Afraid, Rape, and Kick questionnaire    Fear of Current or Ex-Partner: No    Emotionally Abused: No    Physically Abused: No    Sexually Abused: No    Family History  Problem Relation Age of Onset   Stroke Mother    Hypertension Mother    Diabetes Mother    Cancer Father        bone   Alcohol abuse Brother        past   Diabetes Maternal Uncle    Stroke Maternal Grandfather     Prostate cancer Neg Hx    Bladder Cancer Neg Hx    Kidney cancer Neg Hx      Vitals:   08/01/24 1938 08/02/24 0013 08/02/24 0309 08/02/24 0735  BP: (!) 147/72 (!) 159/80 (!) 148/94 (!) 151/94  Pulse: 74 72 76 82  Resp: 20 20 20 17   Temp: 99 F (37.2 C) 97.9 F (36.6 C) 99.6 F (37.6 C) 98.1 F (36.7 C)  TempSrc:      SpO2: 95% 93% 95% 96%  Weight:      Height:        PHYSICAL EXAM General: Well appearing elderly male, well nourished, in  no acute distress. HEENT: Normocephalic and atraumatic. Neck: No JVD.  Lungs: Normal respiratory effort on room air. Clear bilaterally to auscultation. No wheezes, crackles, rhonchi.  Heart: HRRR. Normal S1 and S2 without gallops or murmurs.  Abdomen: Non-distended appearing.  Msk: Normal strength and tone for age. Extremities: Warm and well perfused. No clubbing, cyanosis. No edema.  Neuro: Alert and oriented X 3. Psych: Answers questions appropriately.   Labs: Basic Metabolic Panel: Recent Labs    08/01/24 0503 08/02/24 0353  NA 134* 136  K 3.8 3.9  CL 103 107  CO2 21* 20*  GLUCOSE 173* 177*  BUN 16 17  CREATININE 0.92 0.98  CALCIUM  8.8* 8.7*  MG 2.1 2.1   Liver Function Tests: Recent Labs    07/31/24 2032  AST 29  ALT 36  ALKPHOS 50  BILITOT 1.3*  PROT 7.4  ALBUMIN 4.4   No results for input(s): LIPASE, AMYLASE in the last 72 hours. CBC: Recent Labs    07/31/24 2032 08/01/24 0503 08/02/24 0353  WBC 5.9 10.6* 8.1  NEUTROABS 5.6  --   --   HGB 15.7 14.8 13.9  HCT 45.2 41.9 40.1  MCV 92.8 93.5 94.1  PLT 129* 143* 113*   Cardiac Enzymes: Recent Labs    07/31/24 2032 07/31/24 2348  TROPONINIHS 8 17   BNP: No results for input(s): BNP in the last 72 hours. D-Dimer: No results for input(s): DDIMER in the last 72 hours. Hemoglobin A1C: No results for input(s): HGBA1C in the last 72 hours. Fasting Lipid Panel: No results for input(s): CHOL, HDL, LDLCALC, TRIG, CHOLHDL, LDLDIRECT in  the last 72 hours. Thyroid Function Tests: No results for input(s): TSH, T4TOTAL, T3FREE, THYROIDAB in the last 72 hours.  Invalid input(s): FREET3 Anemia Panel: No results for input(s): VITAMINB12, FOLATE, FERRITIN, TIBC, IRON, RETICCTPCT in the last 72 hours.   Radiology: ECHOCARDIOGRAM COMPLETE Result Date: 08/01/2024    ECHOCARDIOGRAM REPORT   Patient Name:   Gregory Humphrey [facility] Date of Exam: 08/01/2024 Medical Rec #:  [id number]        Height:       70.0 in Accession #:    [id number]       Weight:       180.0 lb Date of Birth:  Jan 24, 1941       BSA:          1.996 m Patient Age:    82 years         BP:           136/79 mmHg Patient Gender: M                HR:           87 bpm. Exam Location:  [facility] Procedure: 2D Echo, Cardiac Doppler and Color Doppler (Both Spectral and Color            Flow Doppler were utilized during procedure). Indications:     Dyspnea R06.00  History:         Patient has prior history of Echocardiogram examinations, most                  recent 06/30/2024.  Sonographer:     Christopher Furnace Referring Phys:  [id number] Emonni Depasquale Diagnosing Phys: Keller Paterson  Sonographer Comments: Suboptimal parasternal window. IMPRESSIONS  1. Left ventricular ejection fraction, by estimation, is 60 to 65%. The left ventricle has normal function. The left ventricle has no regional wall motion abnormalities. There is  mild left ventricular hypertrophy. Left ventricular diastolic parameters are consistent with Grade I diastolic dysfunction (impaired relaxation).  2. Right ventricular systolic function is normal. The right ventricular size is normal.  3. The mitral valve is normal in structure. Trivial mitral valve regurgitation.  4. The aortic valve is tricuspid. Aortic valve regurgitation is mild. FINDINGS  Left Ventricle: Left ventricular ejection fraction, by estimation, is 60 to 65%. The left ventricle has normal function. The left ventricle has no regional wall motion  abnormalities. The left ventricular internal cavity size was normal in size. There is  mild left ventricular hypertrophy. Left ventricular diastolic parameters are consistent with Grade I diastolic dysfunction (impaired relaxation). Right Ventricle: The right ventricular size is normal. No increase in right ventricular wall thickness. Right ventricular systolic function is normal. Left Atrium: Left atrial size was normal in size. Right Atrium: Right atrial size was normal in size. Pericardium: There is no evidence of pericardial effusion. Mitral Valve: The mitral valve is normal in structure. Trivial mitral valve regurgitation. MV peak gradient, 3.9 mmHg. The mean mitral valve gradient is 2.0 mmHg. Tricuspid Valve: The tricuspid valve is normal in structure. Tricuspid valve regurgitation is trivial. Aortic Valve: The aortic valve is tricuspid. Aortic valve regurgitation is mild. Aortic valve mean gradient measures 3.5 mmHg. Aortic valve peak gradient measures 5.1 mmHg. Aortic valve area, by VTI measures 3.52 cm. Pulmonic Valve: The pulmonic valve was not well visualized. Pulmonic valve regurgitation is not visualized. Aorta: The aortic root is normal in size and structure. IAS/Shunts: The atrial septum is grossly normal.  LEFT VENTRICLE PLAX 2D LVIDd:         3.40 cm   Diastology LVIDs:         2.30 cm   LV e' medial:    4.90 cm/s LV PW:         1.20 cm   LV E/e' medial:  10.6 LV IVS:        1.20 cm   LV e' lateral:   10.30 cm/s LVOT diam:     2.00 cm   LV E/e' lateral: 5.0 LV SV:         65 LV SV Index:   33 LVOT Area:     3.14 cm  RIGHT VENTRICLE RV Basal diam:  3.00 cm RV Mid diam:    2.00 cm RV S prime:     15.80 cm/s TAPSE (M-mode): 1.7 cm LEFT ATRIUM           Index        RIGHT ATRIUM          Index LA diam:      3.60 cm 1.80 cm/m   RA Area:     4.89 cm LA Vol (A2C): 28.2 ml 14.13 ml/m  RA Volume:   5.52 ml  2.77 ml/m LA Vol (A4C): 9.9 ml  4.98 ml/m  AORTIC VALVE AV Area (Vmax):    3.14 cm AV Area  (Vmean):   2.87 cm AV Area (VTI):     3.52 cm AV Vmax:           113.00 cm/s AV Vmean:          87.200 cm/s AV VTI:            0.186 m AV Peak Grad:      5.1 mmHg AV Mean Grad:      3.5 mmHg LVOT Vmax:         113.00 cm/s LVOT Vmean:  79.800 cm/s LVOT VTI:          0.208 m LVOT/AV VTI ratio: 1.12  AORTA Ao Root diam: 2.80 cm MITRAL VALVE               TRICUSPID VALVE MV Area (PHT): 2.53 cm    TR Peak grad:   15.8 mmHg MV Area VTI:   2.43 cm    TR Vmax:        199.00 cm/s MV Peak grad:  3.9 mmHg MV Mean grad:  2.0 mmHg    SHUNTS MV Vmax:       0.98 m/s    Systemic VTI:  0.21 m MV Vmean:      62.2 cm/s   Systemic Diam: 2.00 cm MV Decel Time: 300 msec MV E velocity: 51.90 cm/s MV A velocity: 88.00 cm/s MV E/A ratio:  0.59 Keller Paterson Electronically signed by Keller Paterson Signature Date/Time: 08/01/2024/4:48:00 PM    Final    CT ABDOMEN PELVIS W CONTRAST Result Date: 08/01/2024 CLINICAL DATA:  Bowel obstruction suspected EXAM: CT ABDOMEN AND PELVIS WITH CONTRAST TECHNIQUE: Multidetector CT imaging of the abdomen and pelvis was performed using the standard protocol following bolus administration of intravenous contrast. RADIATION DOSE REDUCTION: This exam was performed according to the departmental dose-optimization program which includes automated exposure control, adjustment of the mA and/or kV according to patient size and/or use of iterative reconstruction technique. CONTRAST:  OMNIPAQUE  IOHEXOL  300 MG/ML  SOLN COMPARISON:  CT 09/17/2017 FINDINGS: Lower chest: Lung bases are clear. Hepatobiliary: No focal hepatic lesion. Several 5-10 mm gallstones within lumen the gallbladder. No gallbladder inflammation. Common bile duct normal caliber. No biliary duct dilatation. Common bile duct is normal. Pancreas: Pancreas is normal. No ductal dilatation. No pancreatic inflammation. Spleen: Normal spleen Adrenals/urinary tract: Adrenal glands and kidneys are normal. The ureters and bladder normal.  Stomach/Bowel: Stomach, duodenum proximal small bowel normal. The distal small bowel is fluid-filled but not significantly dilated measuring up to 2.2 cm. Terminal ileum is normal. No caliber significant caliber change. The appendix is mildly dilated at 11 mm (image 64/2. There is enhancement of the wall of the appendix which measures 3 mm in thickness. There is small amount of fluid inflammation within the inferior RIGHT pericolic gutter adjacent to the appendix (image 61/2). Findings are most consistent acute appendicitis. No perforation or abscess. Appendix in typical location extending inferior from the cecum in the iliac fossa. The colon and rectosigmoid colon are normal. Multiple diverticula of the descending colon and sigmoid colon without acute inflammation. Vascular/Lymphatic: Abdominal aorta is normal caliber. No periportal or retroperitoneal adenopathy. No pelvic adenopathy. Reproductive: Unremarkable Other: No intraperitoneal free air Musculoskeletal: No aggressive osseous lesion. IMPRESSION: 1. Findings most consistent with acute appendicitis. No perforation or abscess. 2. Fluid-filled distal small bowel is favored mild ileus. 3. Cholelithiasis without evidence cholecystitis. 4. LEFT colon diverticulosis without evidence diverticulitis. Electronically Signed   By: Jackquline Boxer M.D.   On: 08/01/2024 12:08   DG Abd 1 View Result Date: 08/01/2024 CLINICAL DATA:  [id number] Abdominal pain [id number] EXAM: ABDOMEN - 1 VIEW COMPARISON:  09/17/2017 FINDINGS: A couple of gas-filled, mildly dilated segments of small bowel noted in the left upper quadrant, measuring up to 3.1 cm. The right lower quadrant small bowel appears decompressed.No pneumoperitoneum. No organomegaly or radiopaque calculi. No acute fracture or destructive lesion. Multilevel degenerative disc disease of the spine. IMPRESSION: A couple of gas-filled, mildly dilated segments of small bowel noted in the left upper quadrant  measuring up to 3.1  cm. If there is clinical concern for underlying small-bowel obstruction, a follow-up CT of the abdomen and pelvis with IV contrast would be recommended for further characterization. Alternatively, serial KUB follow-up could be considered. Electronically Signed   By: Rogelia Myers M.D.   On: 08/01/2024 08:45   US  Abdomen Limited RUQ (LIVER/GB) Result Date: 08/01/2024 CLINICAL DATA:  [id number].  Right upper quadrant pain. EXAM: ULTRASOUND ABDOMEN LIMITED RIGHT UPPER QUADRANT COMPARISON:  CT without contrast 09/17/2017. FINDINGS: Gallbladder: There are layering shadowing stones in the gallbladder, largest measured is 1.5 cm. There is no wall thickening, positive sonographic Murphy's sign or pericholecystic fluid. Common bile duct: Diameter: Obscured by bowel gas and by diffuse attenuation from the liver. Liver: No focal lesion identified. The liver is diffusely attenuating and echogenic. This is usually due to hepatic steatosis. Portal vein is patent on color Doppler imaging with normal direction of blood flow towards the liver. Other: No right upper quadrant ascites. IMPRESSION: 1. Cholelithiasis without evidence of acute cholecystitis. 2. Common bile duct is obscured by bowel gas and by diffuse attenuation from the liver. 3. Diffusely attenuating and echogenic liver, usually due to hepatic steatosis. Electronically Signed   By: Francis Quam M.D.   On: 08/01/2024 06:32   DG Chest Portable 1 View Result Date: 07/31/2024 EXAM: 1 VIEW XRAY OF THE CHEST 07/31/2024 09:22:00 PM COMPARISON: 12/04/2016 CLINICAL HISTORY: Fever. Pt comes with HTN, nausea and the shakes. Pt states no cp or sob. Pt states he was at church and got home and this started. Pt takes bp meds and has taken them today. Wife reports the bp readings were in the 200s at home. Wife also states his sugars have been little elevated. Pt is diabetic. Pt now stating he was having trouble getting out bed. Wife states the pt is now getting confused. The pt  repeats Im getting confused. Wife reports hx of stroke and brain bleed in past. FINDINGS: LUNGS AND PLEURA: No focal pulmonary opacity. No pulmonary edema. No pleural effusion. No pneumothorax. HEART AND MEDIASTINUM: No acute abnormality of the cardiac and mediastinal silhouettes. Thoracic aortic atherosclerosis. BONES AND SOFT TISSUES: No acute osseous abnormality. Defibrillator pad overlying the left hemithorax. IMPRESSION: 1. No acute abnormalities. Electronically signed by: Pinkie Pebbles MD 07/31/2024 09:25 PM EDT RP Workstation: [id number]   CT HEAD WO CONTRAST ( ) Result Date: 07/31/2024 CLINICAL DATA:  Confusion EXAM: CT HEAD WITHOUT CONTRAST TECHNIQUE: Contiguous axial images were obtained from the base of the skull through the vertex without intravenous contrast. RADIATION DOSE REDUCTION: This exam was performed according to the departmental dose-optimization program which includes automated exposure control, adjustment of the mA and/or kV according to patient size and/or use of iterative reconstruction technique. COMPARISON:  01/11/2023 FINDINGS: Brain: Chronic extra-axial collection noted under the left craniotomy defect measuring approximately 5 mm in thickness, unchanged. No underlying mass effect or midline shift. There is atrophy and chronic small vessel disease changes. Encephalomalacia within the inferior left frontal and temporal lobes, stable. No acute intracranial abnormality. Specifically, no hemorrhage, hydrocephalus, mass lesion, acute infarction, or significant intracranial injury. Vascular: No hyperdense vessel or unexpected calcification. Skull: Prior left temporal craniotomy. No acute calvarial abnormality. Sinuses/Orbits: No acute findings Other: None IMPRESSION: Atrophy, chronic microvascular disease. No acute intracranial abnormality. Stable inferior left frontal and temporal encephalomalacia. Stable small low-density extra-axial fluid collection underlying the left side  craniotomy defect. Electronically Signed   By: Franky Crease M.D.   On: 07/31/2024 20:07  ECHO as above  TELEMETRY reviewed by me 08/02/2024: SR rate 80s  EKG reviewed by me: SVT rate 174 bpm  Data reviewed by me 08/02/2024: last 24h vitals tele labs imaging I/O ED provider note, admission H&P, hospitalist progress note, surgery note  Principal Problem:   PSVT (paroxysmal supraventricular tachycardia) Active Problems:   Coronary artery disease   Dyslipidemia   Type 2 diabetes mellitus without complications (HCC)   Essential hypertension    ASSESSMENT AND PLAN:  DAESHAWN REDMANN is a 83 y.o. male  with a past medical history of CAD s/p DES to mid LAD 2018, hx CVA and subdural hematoma, hypertension, hyperlipidemia, type II diabetes, seizure disorder who presented to the ED on 07/31/2024 for chills, significantly elevated BP. EKG on admission showed SVT and he converted with adenosine . Cardiology was consulted for further evaluation.   # Supraventricular tachycardia # Acute appendicitis # CAD s/p DES to mid LAD 2018 # Hypertension Patient with hx of CAD and hypertension presented with concerns of chills/elevated BP. While in the ED he went into SVT with HR in the 170s and received one dose of adenosine  with successful conversion to NSR. Has been noted to have low grade fevers since admission, no clear cause identified as of yet. Echo with EF 60-65%, no WMAs, mild LVH, grade I diastolic dysfunction, trivial MR.  -Contiune metoprolol  succinate 75 mg daily.  -Continue to treat causes of increased adrenergic tone including fever, infection, hypoxia, pain, etc.   -Continue home lisinopril , imdur , hydralazine .  -Continue home crestor .  -Patient is at elevated (intermediate) risk considering cardiac history, not prohibitive risk. No anginal symptoms, echo normal, trop normal inspite of SVT. Remaining in sinus rhythm. Optimized from cardiac standpoint. Okay to hold plavix , recommend continuing  low dose aspirin . Can hold for short duration if absolutely need to.    This patient's plan of care was discussed and created with Dr. Wilburn and he is in agreement.  Signed: Danita Bloch, PA-C  08/02/2024, 9:50 AM [facility] Cardiology

## 2024-08-02 NOTE — Plan of Care (Signed)

## 2024-08-02 NOTE — Progress Notes (Signed)
 PROGRESS NOTE   HPI was taken from Dr. Lawence: Gregory Humphrey is a 83 y.o. male with medical history significant for type 2 diabetes mellitus, Bertrum syndrome, hypertension, seizure disorder, CVA and history of subdural hematoma, who presented to the emergency room with acute onset of shivering all over with cold chills per his wife and elevated blood pressure that was up to 227/111 and later 203/108.  The patient denied any headache or dizziness or blurred vision, paresthesias or focal muscle weakness.  He was noted to have low-grade fever of 100.3.  He has been having nausea and indigestion vomiting or abdominal pain or diarrhea or melena or bright red bleeding per rectum.  He denied any chest pain or palpitations.  He has been having dyspnea with the symptoms and denied any cough or wheezing.  No dysuria, oliguria or hematuria or flank pain.   ED Course: When he came to the ER, BP was 146/89 with otherwise normal vital signs.  Labs revealed a blood glucose of 167 with CO2 of 21, total bili 1.3 otherwise unremarkable CMP.  High sensitive troponin I was 8 and later 17.  Lactic acid was 2.3 and 0.9.  CBC showed thrombocytopenia 129.  PT and INR were normal.  Urinalysis showed 150 glucose 100 protein and was otherwise unremarkable.  Blood cultures were drawn as well as respiratory panel. EKG as reviewed by me : EKG showed SVT with a rate of 174 and ST segment depression laterally and anterolateral septally. After IV adenosine  EKG showed sinus tachycardia with rate 111 with prolonged PR interval. Imaging: Portable chest x-ray showed no acute cardiopulmonary disease.   The patient was given 2 g of IV Rocephin , 1 L bolus of IV normal saline in addition to IV adenosine .  He will be admitted to a progressive unit bed for further evaluation and management.    Gregory Humphrey  FMW:[id number] DOB: Feb 01, 1941 DOA: 07/31/2024 PCP: Lenon Layman ORN, MD   Assessment & Plan:   Principal Problem:   PSVT  (paroxysmal supraventricular tachycardia) Active Problems:   Essential hypertension   Coronary artery disease   Type 2 diabetes mellitus without complications (HCC)   Dyslipidemia  Assessment and Plan: PSVT: s/p IV adenosine . Continue on increased dose of metoprolol  Echo shows EF 60-65%, grade I diastolic function, no regional wall motion abnormalities. Continue on tele. Cardio following and recs apprec   Acute appendicitis: as per CT scan. Abxs changed to IV rocephin  and flagyl . Will go for appendectomy today. NPO. General surg following and recs apprec  HTN: continue on metoprolol , hydralazine , imdur , lisinopril     Hx of CAD: holding aspirin  and plavix  for above surgery. Continue on metoprolol , lisinopril     DM2: likely well controlled. Continue on SSI w/ accuchecks   HLD: continue on statin   Thrombocytopenia: etiology unclear. Labile. Will continue to monitor   Seizure disorder: continue on home dose of lamotrigine   DVT prophylaxis: lovenox   Code Status: DNR Family Communication: discussed pt's care w/ pt's family at bedside and answered their questions Disposition Plan: depends on PT/OT recs (not consulted yet)   Level of care: Progressive  Status is: Inpatient Remains inpatient appropriate because: severity of illness    Consultants:  Cardio  Gen surg   Procedures:   Antimicrobials:   Subjective: Pt c/o abd pain   Objective: Vitals:   08/01/24 1938 08/02/24 0013 08/02/24 0309 08/02/24 0735  BP: (!) 147/72 (!) 159/80 (!) 148/94 (!) 151/94  Pulse: 74 72 76 82  Resp:  20 20 20 17   Temp: 99 F (37.2 C) 97.9 F (36.6 C) 99.6 F (37.6 C) 98.1 F (36.7 C)  TempSrc:      SpO2: 95% 93% 95% 96%  Weight:      Height:        Intake/Output Summary (Last 24 hours) at 08/02/2024 0932 Last data filed at 08/02/2024 0730 Gross per 24 hour  Intake 3040.36 ml  Output 950 ml  Net 2090.36 ml   Filed Weights   08/01/24 0317  Weight: 81.6 kg     Examination:  General exam: appears comfortable  Respiratory system: clear breath sound sb/l  Cardiovascular system: S1/S2+. No rubs or clicks  Gastrointestinal system: abd is soft, tenderness to palpation, & hypoactive bowel sounds Central nervous system: alert & oriented. Moves all extremities Psychiatry: judgement and insight appears at baseline. Flat mood and affect    Data Reviewed: I have personally reviewed following labs and imaging studies  CBC: Recent Labs  Lab 07/31/24 2032 08/01/24 0503 08/02/24 0353  WBC 5.9 10.6* 8.1  NEUTROABS 5.6  --   --   HGB 15.7 14.8 13.9  HCT 45.2 41.9 40.1  MCV 92.8 93.5 94.1  PLT 129* 143* 113*   Basic Metabolic Panel: Recent Labs  Lab 07/31/24 2032 08/01/24 0503 08/02/24 0353  NA 137 134* 136  K 3.6 3.8 3.9  CL 103 103 107  CO2 21* 21* 20*  GLUCOSE 167* 173* 177*  BUN 22 16 17   CREATININE 1.14 0.92 0.98  CALCIUM  9.5 8.8* 8.7*  MG  --  2.1 2.1   GFR: Estimated Creatinine Clearance: 61 mL/min (by C-G formula based on SCr of 0.98 mg/dL). Liver Function Tests: Recent Labs  Lab 07/31/24 2032  AST 29  ALT 36  ALKPHOS 50  BILITOT 1.3*  PROT 7.4  ALBUMIN 4.4   No results for input(s): LIPASE, AMYLASE in the last 168 hours. No results for input(s): AMMONIA in the last 168 hours. Coagulation Profile: Recent Labs  Lab 07/31/24 2032 08/01/24 0503  INR 1.0 1.1   Cardiac Enzymes: No results for input(s): CKTOTAL, CKMB, CKMBINDEX, TROPONINI in the last 168 hours. BNP (last 3 results) No results for input(s): PROBNP in the last 8760 hours. HbA1C: No results for input(s): HGBA1C in the last 72 hours. CBG: No results for input(s): GLUCAP in the last 168 hours. Lipid Profile: No results for input(s): CHOL, HDL, LDLCALC, TRIG, CHOLHDL, LDLDIRECT in the last 72 hours. Thyroid Function Tests: No results for input(s): TSH, T4TOTAL, FREET4, T3FREE, THYROIDAB in the last 72  hours. Anemia Panel: No results for input(s): VITAMINB12, FOLATE, FERRITIN, TIBC, IRON, RETICCTPCT in the last 72 hours. Sepsis Labs: Recent Labs  Lab 07/31/24 2032 07/31/24 2348  LATICACIDVEN 2.3* 1.9    Recent Results (from the past 240 hours)  Resp panel by RT-PCR (RSV, Flu A&B, Covid) Anterior Nasal Swab     Status: None   Collection Time: 07/31/24  8:32 PM   Specimen: Anterior Nasal Swab  Result Value Ref Range Status   SARS Coronavirus 2 by RT PCR NEGATIVE NEGATIVE Final    Comment: (NOTE) SARS-CoV-2 target nucleic acids are NOT DETECTED.  The SARS-CoV-2 RNA is generally detectable in upper respiratory specimens during the acute phase of infection. The lowest concentration of SARS-CoV-2 viral copies this assay can detect is 138 copies/mL. A negative result does not preclude SARS-Cov-2 infection and should not be used as the sole basis for treatment or other patient management decisions. A negative result  may occur with  improper specimen collection/handling, submission of specimen other than nasopharyngeal swab, presence of viral mutation(s) within the areas targeted by this assay, and inadequate number of viral copies(<138 copies/mL). A negative result must be combined with clinical observations, patient history, and epidemiological information. The expected result is Negative.  Fact Sheet for Patients:  BloggerCourse.com  Fact Sheet for Healthcare Providers:  SeriousBroker.it  This test is no t yet approved or cleared by the [place]  FDA and  has been authorized for detection and/or diagnosis of SARS-CoV-2 by FDA under an Emergency Use Authorization (EUA). This EUA will remain  in effect (meaning this test can be used) for the duration of the COVID-19 declaration under Section 564(b)(1) of the Act, 21 U.S.C.section 360bbb-3(b)(1), unless the authorization is terminated  or revoked sooner.        Influenza A by PCR NEGATIVE NEGATIVE Final   Influenza B by PCR NEGATIVE NEGATIVE Final    Comment: (NOTE) The Xpert Xpress SARS-CoV-2/FLU/RSV plus assay is intended as an aid in the diagnosis of influenza from Nasopharyngeal swab specimens and should not be used as a sole basis for treatment. Nasal washings and aspirates are unacceptable for Xpert Xpress SARS-CoV-2/FLU/RSV testing.  Fact Sheet for Patients: BloggerCourse.com  Fact Sheet for Healthcare Providers: SeriousBroker.it  This test is not yet approved or cleared by the [place]  FDA and has been authorized for detection and/or diagnosis of SARS-CoV-2 by FDA under an Emergency Use Authorization (EUA). This EUA will remain in effect (meaning this test can be used) for the duration of the COVID-19 declaration under Section 564(b)(1) of the Act, 21 U.S.C. section 360bbb-3(b)(1), unless the authorization is terminated or revoked.     Resp Syncytial Virus by PCR NEGATIVE NEGATIVE Final    Comment: (NOTE) Fact Sheet for Patients: BloggerCourse.com  Fact Sheet for Healthcare Providers: SeriousBroker.it  This test is not yet approved or cleared by the [place]  FDA and has been authorized for detection and/or diagnosis of SARS-CoV-2 by FDA under an Emergency Use Authorization (EUA). This EUA will remain in effect (meaning this test can be used) for the duration of the COVID-19 declaration under Section 564(b)(1) of the Act, 21 U.S.C. section 360bbb-3(b)(1), unless the authorization is terminated or revoked.  Performed at [facility], [address] [place], [place] [address]   Blood culture (routine x 2)     Status: None (Preliminary result)   Collection Time: 07/31/24 11:48 PM   Specimen: BLOOD  Result Value Ref Range Status   Specimen Description BLOOD RIGHT ANTECUBITAL  Final   Special Requests    Final    BOTTLES DRAWN AEROBIC AND ANAEROBIC Blood Culture results may not be optimal due to an inadequate volume of blood received in culture bottles   Culture   Final    NO GROWTH 2 DAYS Performed at [facility], [address], [place], [place] [address]    Report Status PENDING  Incomplete  Blood culture (routine x 2)     Status: None (Preliminary result)   Collection Time: 07/31/24 11:48 PM   Specimen: BLOOD  Result Value Ref Range Status   Specimen Description BLOOD LEFT ANTECUBITAL  Final   Special Requests   Final    BOTTLES DRAWN AEROBIC AND ANAEROBIC Blood Culture adequate volume   Culture   Final    NO GROWTH 2 DAYS Performed at [facility], [address], [place], [place] [address]    Report Status PENDING  Incomplete  Respiratory (~20 pathogens) panel by PCR     Status: None   Collection Time: 07/31/24 11:48 PM   Specimen: Nasopharyngeal Swab; Respiratory  Result Value Ref Range Status   Adenovirus NOT DETECTED NOT DETECTED Final   Coronavirus 229E NOT DETECTED NOT DETECTED Final    Comment: (NOTE) The Coronavirus on the Respiratory Panel, DOES NOT test for the novel  Coronavirus (2019 nCoV)    Coronavirus HKU1 NOT DETECTED NOT DETECTED Final   Coronavirus NL63 NOT DETECTED NOT DETECTED Final   Coronavirus OC43 NOT DETECTED NOT DETECTED Final   Metapneumovirus NOT DETECTED NOT DETECTED Final   Rhinovirus / Enterovirus NOT DETECTED NOT DETECTED Final   Influenza A NOT DETECTED NOT DETECTED Final   Influenza B NOT DETECTED NOT DETECTED Final   Parainfluenza Virus 1 NOT DETECTED NOT DETECTED Final   Parainfluenza Virus 2 NOT DETECTED NOT DETECTED Final   Parainfluenza Virus 3 NOT DETECTED NOT DETECTED Final   Parainfluenza Virus 4 NOT DETECTED NOT DETECTED Final   Respiratory Syncytial Virus NOT DETECTED NOT DETECTED Final   Bordetella pertussis NOT DETECTED NOT DETECTED Final   Bordetella Parapertussis NOT DETECTED NOT DETECTED Final    Chlamydophila pneumoniae NOT DETECTED NOT DETECTED Final   Mycoplasma pneumoniae NOT DETECTED NOT DETECTED Final    Comment: Performed at [facility], [address], [place], [place] [address]         Radiology Studies: ECHOCARDIOGRAM COMPLETE Result Date: 08/01/2024    ECHOCARDIOGRAM REPORT   Patient Name:   Gregory Humphrey [facility] Date of Exam: 08/01/2024 Medical Rec #:  [id number]        Height:       70.0 in Accession #:    [id number]       Weight:       180.0 lb Date of Birth:  02/09/1941       BSA:          1.996 m Patient Age:    82 years         BP:           136/79 mmHg Patient Gender: M                HR:           87 bpm. Exam Location:  [facility] Procedure: 2D Echo, Cardiac Doppler and Color Doppler (Both Spectral and Color            Flow Doppler were utilized during procedure). Indications:     Dyspnea R06.00  History:         Patient has prior history of Echocardiogram examinations, most                  recent 06/30/2024.  Sonographer:     Christopher Furnace Referring Phys:  [id number] CARALYN HUDSON Diagnosing Phys: Keller Paterson  Sonographer Comments: Suboptimal parasternal window. IMPRESSIONS  1. Left ventricular ejection fraction, by estimation, is 60 to 65%. The left ventricle has normal function. The left ventricle has no regional wall motion abnormalities. There is mild left ventricular hypertrophy. Left ventricular diastolic parameters are consistent with Grade I diastolic dysfunction (impaired relaxation).  2. Right ventricular systolic function is normal. The right ventricular size is normal.  3. The mitral valve is normal in structure. Trivial mitral valve regurgitation.  4. The aortic valve is tricuspid. Aortic valve regurgitation is mild. FINDINGS  Left Ventricle: Left ventricular ejection fraction, by estimation, is 60 to 65%. The left ventricle has normal  function. The left ventricle has no regional wall motion abnormalities. The left ventricular internal cavity size was normal in size.  There is  mild left ventricular hypertrophy. Left ventricular diastolic parameters are consistent with Grade I diastolic dysfunction (impaired relaxation). Right Ventricle: The right ventricular size is normal. No increase in right ventricular wall thickness. Right ventricular systolic function is normal. Left Atrium: Left atrial size was normal in size. Right Atrium: Right atrial size was normal in size. Pericardium: There is no evidence of pericardial effusion. Mitral Valve: The mitral valve is normal in structure. Trivial mitral valve regurgitation. MV peak gradient, 3.9 mmHg. The mean mitral valve gradient is 2.0 mmHg. Tricuspid Valve: The tricuspid valve is normal in structure. Tricuspid valve regurgitation is trivial. Aortic Valve: The aortic valve is tricuspid. Aortic valve regurgitation is mild. Aortic valve mean gradient measures 3.5 mmHg. Aortic valve peak gradient measures 5.1 mmHg. Aortic valve area, by VTI measures 3.52 cm. Pulmonic Valve: The pulmonic valve was not well visualized. Pulmonic valve regurgitation is not visualized. Aorta: The aortic root is normal in size and structure. IAS/Shunts: The atrial septum is grossly normal.  LEFT VENTRICLE PLAX 2D LVIDd:         3.40 cm   Diastology LVIDs:         2.30 cm   LV e' medial:    4.90 cm/s LV PW:         1.20 cm   LV E/e' medial:  10.6 LV IVS:        1.20 cm   LV e' lateral:   10.30 cm/s LVOT diam:     2.00 cm   LV E/e' lateral: 5.0 LV SV:         65 LV SV Index:   33 LVOT Area:     3.14 cm  RIGHT VENTRICLE RV Basal diam:  3.00 cm RV Mid diam:    2.00 cm RV S prime:     15.80 cm/s TAPSE (M-mode): 1.7 cm LEFT ATRIUM           Index        RIGHT ATRIUM          Index LA diam:      3.60 cm 1.80 cm/m   RA Area:     4.89 cm LA Vol (A2C): 28.2 ml 14.13 ml/m  RA Volume:   5.52 ml  2.77 ml/m LA Vol (A4C): 9.9 ml  4.98 ml/m  AORTIC VALVE AV Area (Vmax):    3.14 cm AV Area (Vmean):   2.87 cm AV Area (VTI):     3.52 cm AV Vmax:           113.00 cm/s  AV Vmean:          87.200 cm/s AV VTI:            0.186 m AV Peak Grad:      5.1 mmHg AV Mean Grad:      3.5 mmHg LVOT Vmax:         113.00 cm/s LVOT Vmean:        79.800 cm/s LVOT VTI:          0.208 m LVOT/AV VTI ratio: 1.12  AORTA Ao Root diam: 2.80 cm MITRAL VALVE               TRICUSPID VALVE MV Area (PHT): 2.53 cm    TR Peak grad:   15.8 mmHg MV Area VTI:   2.43 cm  TR Vmax:        199.00 cm/s MV Peak grad:  3.9 mmHg MV Mean grad:  2.0 mmHg    SHUNTS MV Vmax:       0.98 m/s    Systemic VTI:  0.21 m MV Vmean:      62.2 cm/s   Systemic Diam: 2.00 cm MV Decel Time: 300 msec MV E velocity: 51.90 cm/s MV A velocity: 88.00 cm/s MV E/A ratio:  0.59 Keller Paterson Electronically signed by Keller Paterson Signature Date/Time: 08/01/2024/4:48:00 PM    Final    CT ABDOMEN PELVIS W CONTRAST Result Date: 08/01/2024 CLINICAL DATA:  Bowel obstruction suspected EXAM: CT ABDOMEN AND PELVIS WITH CONTRAST TECHNIQUE: Multidetector CT imaging of the abdomen and pelvis was performed using the standard protocol following bolus administration of intravenous contrast. RADIATION DOSE REDUCTION: This exam was performed according to the departmental dose-optimization program which includes automated exposure control, adjustment of the mA and/or kV according to patient size and/or use of iterative reconstruction technique. CONTRAST:  100mL OMNIPAQUE  IOHEXOL  300 MG/ML  SOLN COMPARISON:  CT 09/17/2017 FINDINGS: Lower chest: Lung bases are clear. Hepatobiliary: No focal hepatic lesion. Several 5-10 mm gallstones within lumen the gallbladder. No gallbladder inflammation. Common bile duct normal caliber. No biliary duct dilatation. Common bile duct is normal. Pancreas: Pancreas is normal. No ductal dilatation. No pancreatic inflammation. Spleen: Normal spleen Adrenals/urinary tract: Adrenal glands and kidneys are normal. The ureters and bladder normal. Stomach/Bowel: Stomach, duodenum proximal small bowel normal. The distal small bowel is  fluid-filled but not significantly dilated measuring up to 2.2 cm. Terminal ileum is normal. No caliber significant caliber change. The appendix is mildly dilated at 11 mm (image 64/2. There is enhancement of the wall of the appendix which measures 3 mm in thickness. There is small amount of fluid inflammation within the inferior RIGHT pericolic gutter adjacent to the appendix (image 61/2). Findings are most consistent acute appendicitis. No perforation or abscess. Appendix in typical location extending inferior from the cecum in the iliac fossa. The colon and rectosigmoid colon are normal. Multiple diverticula of the descending colon and sigmoid colon without acute inflammation. Vascular/Lymphatic: Abdominal aorta is normal caliber. No periportal or retroperitoneal adenopathy. No pelvic adenopathy. Reproductive: Unremarkable Other: No intraperitoneal free air Musculoskeletal: No aggressive osseous lesion. IMPRESSION: 1. Findings most consistent with acute appendicitis. No perforation or abscess. 2. Fluid-filled distal small bowel is favored mild ileus. 3. Cholelithiasis without evidence cholecystitis. 4. LEFT colon diverticulosis without evidence diverticulitis. Electronically Signed   By: Jackquline Boxer M.D.   On: 08/01/2024 12:08   DG Abd 1 View Result Date: 08/01/2024 CLINICAL DATA:  [id number] Abdominal pain [id number] EXAM: ABDOMEN - 1 VIEW COMPARISON:  09/17/2017 FINDINGS: A couple of gas-filled, mildly dilated segments of small bowel noted in the left upper quadrant, measuring up to 3.1 cm. The right lower quadrant small bowel appears decompressed.No pneumoperitoneum. No organomegaly or radiopaque calculi. No acute fracture or destructive lesion. Multilevel degenerative disc disease of the spine. IMPRESSION: A couple of gas-filled, mildly dilated segments of small bowel noted in the left upper quadrant measuring up to 3.1 cm. If there is clinical concern for underlying small-bowel obstruction, a follow-up CT of  the abdomen and pelvis with IV contrast would be recommended for further characterization. Alternatively, serial KUB follow-up could be considered. Electronically Signed   By: Rogelia Myers M.D.   On: 08/01/2024 08:45   US  Abdomen Limited RUQ (LIVER/GB) Result Date: 08/01/2024 CLINICAL DATA:  [id number].  Right  upper quadrant pain. EXAM: ULTRASOUND ABDOMEN LIMITED RIGHT UPPER QUADRANT COMPARISON:  CT without contrast 09/17/2017. FINDINGS: Gallbladder: There are layering shadowing stones in the gallbladder, largest measured is 1.5 cm. There is no wall thickening, positive sonographic Murphy's sign or pericholecystic fluid. Common bile duct: Diameter: Obscured by bowel gas and by diffuse attenuation from the liver. Liver: No focal lesion identified. The liver is diffusely attenuating and echogenic. This is usually due to hepatic steatosis. Portal vein is patent on color Doppler imaging with normal direction of blood flow towards the liver. Other: No right upper quadrant ascites. IMPRESSION: 1. Cholelithiasis without evidence of acute cholecystitis. 2. Common bile duct is obscured by bowel gas and by diffuse attenuation from the liver. 3. Diffusely attenuating and echogenic liver, usually due to hepatic steatosis. Electronically Signed   By: Francis Quam M.D.   On: 08/01/2024 06:32   DG Chest Portable 1 View Result Date: 07/31/2024 EXAM: 1 VIEW XRAY OF THE CHEST 07/31/2024 09:22:00 PM COMPARISON: 12/04/2016 CLINICAL HISTORY: Fever. Pt comes with HTN, nausea and the shakes. Pt states no cp or sob. Pt states he was at church and got home and this started. Pt takes bp meds and has taken them today. Wife reports the bp readings were in the 200s at home. Wife also states his sugars have been little elevated. Pt is diabetic. Pt now stating he was having trouble getting out bed. Wife states the pt is now getting confused. The pt repeats Im getting confused. Wife reports hx of stroke and brain bleed in past. FINDINGS:  LUNGS AND PLEURA: No focal pulmonary opacity. No pulmonary edema. No pleural effusion. No pneumothorax. HEART AND MEDIASTINUM: No acute abnormality of the cardiac and mediastinal silhouettes. Thoracic aortic atherosclerosis. BONES AND SOFT TISSUES: No acute osseous abnormality. Defibrillator pad overlying the left hemithorax. IMPRESSION: 1. No acute abnormalities. Electronically signed by: Pinkie Pebbles MD 07/31/2024 09:25 PM EDT RP Workstation: [id number]   CT HEAD WO CONTRAST ( ) Result Date: 07/31/2024 CLINICAL DATA:  Confusion EXAM: CT HEAD WITHOUT CONTRAST TECHNIQUE: Contiguous axial images were obtained from the base of the skull through the vertex without intravenous contrast. RADIATION DOSE REDUCTION: This exam was performed according to the departmental dose-optimization program which includes automated exposure control, adjustment of the mA and/or kV according to patient size and/or use of iterative reconstruction technique. COMPARISON:  01/11/2023 FINDINGS: Brain: Chronic extra-axial collection noted under the left craniotomy defect measuring approximately 5 mm in thickness, unchanged. No underlying mass effect or midline shift. There is atrophy and chronic small vessel disease changes. Encephalomalacia within the inferior left frontal and temporal lobes, stable. No acute intracranial abnormality. Specifically, no hemorrhage, hydrocephalus, mass lesion, acute infarction, or significant intracranial injury. Vascular: No hyperdense vessel or unexpected calcification. Skull: Prior left temporal craniotomy. No acute calvarial abnormality. Sinuses/Orbits: No acute findings Other: None IMPRESSION: Atrophy, chronic microvascular disease. No acute intracranial abnormality. Stable inferior left frontal and temporal encephalomalacia. Stable small low-density extra-axial fluid collection underlying the left side craniotomy defect. Electronically Signed   By: Franky Crease M.D.   On: 07/31/2024 20:07         Scheduled Meds:  ascorbic acid   500 mg Oral Q lunch   cholecalciferol   1,000 Units Oral Q lunch   enoxaparin  (LOVENOX ) injection  40 mg Subcutaneous Q24H   hydrALAZINE   20 mg Oral TID WC   isosorbide  mononitrate  15 mg Oral Q breakfast   lamoTRIgine   75 mg Oral Q breakfast   lisinopril   40 mg  Oral Daily   metoprolol  succinate  75 mg Oral Q breakfast   multivitamin with minerals  1 tablet Oral Q lunch   rosuvastatin   10 mg Oral Q supper   Continuous Infusions:  ceFEPime  (MAXIPIME ) IV 2 g (08/02/24 0916)   metronidazole  500 mg (08/02/24 0359)   vancomycin  1,750 mg (08/02/24 0525)     LOS: 2 days       Anthony CHRISTELLA Pouch, MD [facility] Pager [phone]  If 7PM-7AM, please contact night-coverage [email or web address] 08/02/2024, 9:32 AM

## 2024-08-02 NOTE — Anesthesia Preprocedure Evaluation (Signed)
 Anesthesia Evaluation  Patient identified by MRN, date of birth, ID band Patient awake and Patient unresponsive    Reviewed: Allergy & Precautions, NPO status , Patient's Chart, lab work & pertinent test results, Unable to perform ROS - Chart review only  Airway Mallampati: III  TM Distance: >3 FB Neck ROM: full    Dental  (+) Chipped   Pulmonary neg pulmonary ROS Intubated on 11/27 with large subdural hematoma. Remains so today. Family unavailable for questions.   Pulmonary exam normal        Cardiovascular hypertension, Pt. on medications + CAD and + Past MI  negative cardio ROS Normal cardiovascular exam+ dysrhythmias      Neuro/Psych Pt intubated not sedated on Cardene  gtt Emergent surgery  CVA, Residual Symptoms negative neurological ROS  negative psych ROS   GI/Hepatic negative GI ROS, Neg liver ROS,,,  Endo/Other  negative endocrine ROSdiabetes    Renal/GU Renal disease     Musculoskeletal   Abdominal   Peds  Hematology negative hematology ROS (+)   Anesthesia Other Findings Past Medical History: No date: Diabetes mellitus without complication (HCC) No date: Gilbert's syndrome No date: Hypertension No date: Kidney stones No date: Olecranon bursitis No date: Seizures (HCC) No date: Stroke [facility]) No date: Subdural hematoma (HCC)  Past Surgical History: 12/05/2016: CARDIAC CATHETERIZATION; N/A     Comment:  Procedure: Left Heart Cath and Coronary Angiography;                Surgeon: Vinie DELENA Jude, MD;  Location: [facility] INVASIVE CV               LAB;  Service: Cardiovascular;  Laterality: N/A; 12/05/2016: CARDIAC CATHETERIZATION; N/A     Comment:  Procedure: Coronary Stent Intervention;  Surgeon:               Deatrice DELENA Cage, MD;  Location: [facility] INVASIVE CV LAB;                Service: Cardiovascular;  Laterality: N/A; 10/08/2011: CRANIOTOMY     Comment:  Procedure: CRANIOTOMY HEMATOMA EVACUATION  SUBDURAL;                Surgeon: Fairy JONETTA Levels, MD;  Location: [facility] NEURO ORS;                Service: Neurosurgery;  Laterality: Left;  Left               Craniotomy for subdural 10/16/2011: PEG PLACEMENT     Comment:  Procedure: PERCUTANEOUS ENDOSCOPIC GASTROSTOMY (PEG)               PLACEMENT;  Surgeon: Gordy Starch, MD;  Location: [facility]               ENDOSCOPY;  Service: Gastroenterology;  Laterality: N/A; 11/10/1993: REFRACTIVE SURGERY  BMI    Body Mass Index: 25.83 kg/m      Reproductive/Obstetrics negative OB ROS                              Anesthesia Physical Anesthesia Plan  ASA: 3  Anesthesia Plan: General ETT   Post-op Pain Management:    Induction: Intravenous  PONV Risk Score and Plan: 3 and Ondansetron  and Dexamethasone   Airway Management Planned: Oral ETT  Additional Equipment:   Intra-op Plan:   Post-operative Plan: Extubation in OR  Informed Consent: I have reviewed the patients History and Physical, chart, labs  and discussed the procedure including the risks, benefits and alternatives for the proposed anesthesia with the patient or authorized representative who has indicated his/her understanding and acceptance.     Dental Advisory Given  Plan Discussed with: Anesthesiologist, CRNA and Surgeon  Anesthesia Plan Comments: (Patient consented for risks of anesthesia including but not limited to:  - adverse reactions to medications - damage to eyes, teeth, lips or other oral mucosa - nerve damage due to positioning  - sore throat or hoarseness - Damage to heart, brain, nerves, lungs, other parts of body or loss of life  Patient voiced understanding and assent.)        Anesthesia Quick Evaluation

## 2024-08-02 NOTE — Anesthesia Procedure Notes (Signed)
 Procedure Name: Intubation Date/Time: 08/02/2024 4:28 PM  Performed by: Rosine Shona Jansky, CRNAPre-anesthesia Checklist: Patient identified, Emergency Drugs available, Suction available and Patient being monitored Patient Re-evaluated:Patient Re-evaluated prior to induction Oxygen Delivery Method: Circle system utilized Preoxygenation: Pre-oxygenation with 100% oxygen Induction Type: IV induction Ventilation: Mask ventilation without difficulty Laryngoscope Size: McGrath and 4 Grade View: Grade I Tube type: Oral Tube size: 7.0 mm Number of attempts: 1 Airway Equipment and Method: Stylet and Oral airway Placement Confirmation: ETT inserted through vocal cords under direct vision, positive ETCO2 and breath sounds checked- equal and bilateral Secured at: 22 cm Tube secured with: Tape Dental Injury: Teeth and Oropharynx as per pre-operative assessment

## 2024-08-02 NOTE — TOC CM/SW Note (Signed)
 Transition of Care [facility]) - Inpatient Brief Assessment   Patient Details  Name: Gregory Humphrey MRN: [id number] Date of Birth: February 06, 1941  Transition of Care [facility]) CM/SW Contact:    Lauraine JAYSON Carpen, LCSW Phone Number: 08/02/2024, 10:41 AM   Clinical Narrative: CSW reviewed chart. No TOC needs identified so far. CSW will continue to follow progress. Please place [facility] consult if any needs arise.  Transition of Care Asessment: Insurance and Status: Insurance coverage has been reviewed Patient has primary care physician: Yes Home environment has been reviewed: Apartment Prior level of function:: Not documented Prior/Current Home Services: No current home services Social Drivers of Health Review: SDOH reviewed no interventions necessary Readmission risk has been reviewed: Yes Transition of care needs: no transition of care needs at this time

## 2024-08-03 ENCOUNTER — Encounter: Payer: Self-pay | Admitting: General Surgery

## 2024-08-03 ENCOUNTER — Other Ambulatory Visit: Payer: Self-pay

## 2024-08-03 DIAGNOSIS — K3532 Acute appendicitis with perforation and localized peritonitis, without abscess: Secondary | ICD-10-CM | POA: Diagnosis not present

## 2024-08-03 DIAGNOSIS — I10 Essential (primary) hypertension: Secondary | ICD-10-CM | POA: Diagnosis not present

## 2024-08-03 DIAGNOSIS — I2510 Atherosclerotic heart disease of native coronary artery without angina pectoris: Secondary | ICD-10-CM | POA: Diagnosis not present

## 2024-08-03 DIAGNOSIS — I471 Supraventricular tachycardia, unspecified: Secondary | ICD-10-CM | POA: Diagnosis not present

## 2024-08-03 DIAGNOSIS — E872 Acidosis, unspecified: Secondary | ICD-10-CM | POA: Insufficient documentation

## 2024-08-03 LAB — BASIC METABOLIC PANEL WITH GFR
Anion gap: 10 (ref 5–15)
BUN: 21 mg/dL (ref 8–23)
CO2: 21 mmol/L — ABNORMAL LOW (ref 22–32)
Calcium: 8.6 mg/dL — ABNORMAL LOW (ref 8.9–10.3)
Chloride: 107 mmol/L (ref 98–111)
Creatinine, Ser: 1.11 mg/dL (ref 0.61–1.24)
GFR, Estimated: >60 mL/min (ref >60)
Glucose, Bld: 249 mg/dL — ABNORMAL HIGH (ref 70–99)
Potassium: 4.3 mmol/L (ref 3.5–5.1)
Sodium: 138 mmol/L (ref 135–145)

## 2024-08-03 LAB — CBC
HCT: 38.1 % — ABNORMAL LOW (ref 39.0–52.0)
Hemoglobin: 13.3 g/dL (ref 13.0–17.0)
MCH: 32.8 pg (ref 26.0–34.0)
MCHC: 34.9 g/dL (ref 30.0–36.0)
MCV: 94.1 fL (ref 80.0–100.0)
Platelets: 126 K/uL — ABNORMAL LOW (ref 150–400)
RBC: 4.05 MIL/uL — ABNORMAL LOW (ref 4.22–5.81)
RDW: 12.9 % (ref 11.5–15.5)
WBC: 6.6 K/uL (ref 4.0–10.5)
nRBC: 0 % (ref 0.0–0.2)

## 2024-08-03 MED ORDER — CLOPIDOGREL BISULFATE 75 MG PO TABS
75.0000 mg | ORAL_TABLET | Freq: Every day | ORAL | Status: DC
  Administered 2024-08-03: 75 mg via ORAL
  Filled 2024-08-03: qty 1

## 2024-08-03 MED ORDER — HYDROCODONE-ACETAMINOPHEN 5-325 MG PO TABS
1.0000 | ORAL_TABLET | Freq: Three times a day (TID) | ORAL | 0 refills | Status: AC | PRN
  Filled 2024-08-03: qty 10, 4d supply, fill #0

## 2024-08-03 MED ORDER — METOPROLOL SUCCINATE ER 25 MG PO TB24
75.0000 mg | ORAL_TABLET | Freq: Every day | ORAL | 0 refills | Status: AC
  Filled 2024-08-03: qty 90, 30d supply, fill #0

## 2024-08-03 NOTE — Discharge Instructions (Addendum)
  Can restart metformin in 2 days  Diet: Resume home heart healthy regular diet.   Activity: No heavy lifting >20 pounds (children, pets, laundry, garbage) or strenuous activity until follow-up, but light activity and walking are encouraged. Do not drive or drink alcohol if taking narcotic pain medications.  Wound care: May shower with soapy water  and pat dry (do not rub incisions), but no baths or submerging incision underwater until follow-up. (no swimming)   Medications: Resume all home medications. For mild to moderate pain: acetaminophen  (Tylenol ) or ibuprofen (if no kidney disease). Combining Tylenol  with alcohol can substantially increase your risk of causing liver disease. Narcotic pain medications, if prescribed, can be used for severe pain, though may cause nausea, constipation, and drowsiness. Do not combine Tylenol  and Norco within a 6 hour period as Norco contains Tylenol . If you do not need the narcotic pain medication, you do not need to fill the prescription.  Call office [phone]) at any time if any questions, worsening pain, fevers/chills, bleeding, drainage from incision site, or other concerns.

## 2024-08-03 NOTE — Plan of Care (Signed)

## 2024-08-03 NOTE — Progress Notes (Signed)
 [facility] CLINIC CARDIOLOGY PROGRESS NOTE       Patient ID: Gregory Humphrey MRN: [id number] DOB/AGE: 08-07-1941 83 y.o.  Admit date: 07/31/2024 Referring Physician Dr. Madison Peaches Primary Physician Lenon Layman ORN, MD  Primary Cardiologist Dr. Florencio Reason for Consultation PSVT  HPI: Gregory Humphrey is a 83 y.o. male  with a past medical history of CAD s/p DES to mid LAD 2018, hx CVA and subdural hematoma, hypertension, hyperlipidemia, type II diabetes, seizure disorder who presented to the ED on 07/31/2024 for chills, significantly elevated BP. EKG on admission showed SVT and he converted with adenosine . Cardiology was consulted for further evaluation.   Interval history: -Patient seen and examined this AM, resting in bed with wife at bedside.  -Denies SOB, CP, palpitations. No recurrence of SVT on tele. BP and HR stable.  -Underwent appendectomy yesterday with Dr. Rodolph, no complications and doing well this AM.  Review of systems complete and found to be negative unless listed above    Past Medical History:  Diagnosis Date   Diabetes mellitus without complication (HCC)    Gilbert's syndrome    Hypertension    Kidney stones    Olecranon bursitis    Seizures (HCC)    Stroke (HCC)    Subdural hematoma (HCC)     Past Surgical History:  Procedure Laterality Date   CARDIAC CATHETERIZATION N/A 12/05/2016   Procedure: Left Heart Cath and Coronary Angiography;  Surgeon: Vinie DELENA Jude, MD;  Location: [facility] INVASIVE CV LAB;  Service: Cardiovascular;  Laterality: N/A;   CARDIAC CATHETERIZATION N/A 12/05/2016   Procedure: Coronary Stent Intervention;  Surgeon: Deatrice DELENA Cage, MD;  Location: [facility] INVASIVE CV LAB;  Service: Cardiovascular;  Laterality: N/A;   CRANIOTOMY  10/08/2011   Procedure: CRANIOTOMY HEMATOMA EVACUATION SUBDURAL;  Surgeon: Fairy JONETTA Levels, MD;  Location: [facility] NEURO ORS;  Service: Neurosurgery;  Laterality: Left;  Left Craniotomy for subdural   PEG  PLACEMENT  10/16/2011   Procedure: PERCUTANEOUS ENDOSCOPIC GASTROSTOMY (PEG) PLACEMENT;  Surgeon: Gordy Starch, MD;  Location: [facility] ENDOSCOPY;  Service: Gastroenterology;  Laterality: N/A;   REFRACTIVE SURGERY  11/10/1993   XI ROBOTIC LAPAROSCOPIC ASSISTED APPENDECTOMY N/A 08/02/2024   Procedure: APPENDECTOMY, ROBOT-ASSISTED, LAPAROSCOPIC;  Surgeon: Rodolph Romano, MD;  Location: [facility] ORS;  Service: General;  Laterality: N/A;    Medications Prior to Admission  Medication Sig Dispense Refill Last Dose/Taking   acetaminophen  (TYLENOL ) 500 MG tablet Take 500-1,000 mg by mouth See admin instructions. Take 2 tablets (1000mg ) by mouth every morning and take 1 tablet (500mg ) by mouth every night   Taking   aspirin  EC 81 MG EC tablet Take 1 tablet (81 mg total) by mouth daily. 30 tablet 2 07/31/2024   cholecalciferol  (VITAMIN D3) 25 MCG (1000 UNIT) tablet Take 1,000 Units by mouth daily with lunch.   07/31/2024   clopidogrel  (PLAVIX ) 75 MG tablet Take 75 mg by mouth daily.   07/31/2024   hydrALAZINE  (APRESOLINE ) 10 MG tablet Take 20 mg by mouth 3 (three) times daily with meals.   07/31/2024 Noon   isosorbide  mononitrate (IMDUR ) 30 MG 24 hr tablet Take 15 mg by mouth daily with breakfast.   07/31/2024   lamoTRIgine  (LAMICTAL ) 25 MG tablet Take 75 mg by mouth daily with breakfast.   07/31/2024   lisinopril  (PRINIVIL ,ZESTRIL ) 40 MG tablet Take 40 mg by mouth daily.   07/31/2024   metFORMIN (GLUCOPHAGE-XR) 500 MG 24 hr tablet Take 500 mg by mouth daily with supper.   07/30/2024  metoprolol  succinate (TOPROL -XL) 50 MG 24 hr tablet Take 50 mg by mouth daily with breakfast.   07/31/2024   Multiple Vitamin (MULTIVITAMIN) tablet Take 1 tablet by mouth daily with lunch.   07/31/2024   rosuvastatin  (CRESTOR ) 10 MG tablet Take 10 mg by mouth daily with supper.   07/31/2024   vitamin C  (ASCORBIC ACID ) 500 MG tablet Take 500 mg by mouth daily with lunch.   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  Tobacco Use   Smoking status: Never   Smokeless tobacco: Never  Vaping Use   Vaping status: Never Used  Substance and Sexual Activity   Alcohol use: No    Alcohol/week: 0.0 standard drinks of alcohol   Drug use: No   Sexual activity: Never  Other Topics Concern   Not on file  Social History Narrative   Married, lives with wife, adopted son   Social Drivers of Health   Financial Resource Strain: Low Risk  (05/17/2024)   Received from [facility]   Overall Financial Resource Strain (CARDIA)    Difficulty of Paying Living Expenses: Not hard at all  Food Insecurity: No Food Insecurity (08/01/2024)   Hunger Vital Sign    Worried About Running Out of Food in the Last Year: Never true    Ran Out of Food in the Last Year: Never true  Transportation Needs: No Transportation Needs (08/01/2024)   PRAPARE - Administrator, Civil Service (Medical): No    Lack of Transportation (Non-Medical): No  Physical Activity: Not on file  Stress: Not on file  Social Connections: Socially Integrated (08/01/2024)   Social Connection and Isolation Panel    Frequency of Communication with Friends and Family: Three times a week    Frequency of Social Gatherings with Friends and Family: Three times a week    Attends Religious Services: More than 4 times per year    Active Member of Clubs or Organizations: Yes    Attends Banker Meetings: More than 4 times per year    Marital Status: Married  Catering manager Violence: Not At Risk (08/01/2024)   Humiliation, Afraid, Rape, and Kick questionnaire    Fear of Current or Ex-Partner: No    Emotionally Abused: No    Physically Abused: No    Sexually Abused: No    Family  History  Problem Relation Age of Onset   Stroke Mother    Hypertension Mother    Diabetes Mother    Cancer Father        bone   Alcohol abuse Brother        past   Diabetes Maternal Uncle    Stroke Maternal Grandfather    Prostate cancer Neg Hx    Bladder Cancer Neg Hx    Kidney cancer Neg Hx      Vitals:   08/02/24 1746 08/02/24 1759 08/02/24 2046 08/03/24 0410  BP: (!) 140/71  (!) 155/80 (!) 141/72  Pulse: 71 68 73 63  Resp: (!) 9 15 20 20   Temp:  98.4 F (36.9 C) 98.2 F (36.8 C) (!) 97.5 F (36.4 C)  TempSrc:  SpO2: 95% 94% 96% 97%  Weight:      Height:        PHYSICAL EXAM General: Well appearing elderly male, well nourished, in no acute distress. HEENT: Normocephalic and atraumatic. Neck: No JVD.  Lungs: Normal respiratory effort on room air. Clear bilaterally to auscultation. No wheezes, crackles, rhonchi.  Heart: HRRR. Normal S1 and S2 without gallops or murmurs.  Abdomen: Non-distended appearing.  Msk: Normal strength and tone for age. Extremities: Warm and well perfused. No clubbing, cyanosis. No edema.  Neuro: Alert and oriented X 3. Psych: Answers questions appropriately.   Labs: Basic Metabolic Panel: Recent Labs    08/01/24 0503 08/02/24 0353 08/03/24 0413  NA 134* 136 138  K 3.8 3.9 4.3  CL 103 107 107  CO2 21* 20* 21*  GLUCOSE 173* 177* 249*  BUN 16 17 21   CREATININE 0.92 0.98 1.11  CALCIUM  8.8* 8.7* 8.6*  MG 2.1 2.1  --    Liver Function Tests: Recent Labs    07/31/24 2032  AST 29  ALT 36  ALKPHOS 50  BILITOT 1.3*  PROT 7.4  ALBUMIN 4.4   No results for input(s): LIPASE, AMYLASE in the last 72 hours. CBC: Recent Labs    07/31/24 2032 08/01/24 0503 08/02/24 0353 08/03/24 0413  WBC 5.9   < > 8.1 6.6  NEUTROABS 5.6  --   --   --   HGB 15.7   < > 13.9 13.3  HCT 45.2   < > 40.1 38.1*  MCV 92.8   < > 94.1 94.1  PLT 129*   < > 113* 126*   < > = values in this interval not displayed.   Cardiac Enzymes: Recent  Labs    07/31/24 2032 07/31/24 2348  TROPONINIHS 8 17   BNP: No results for input(s): BNP in the last 72 hours. D-Dimer: No results for input(s): DDIMER in the last 72 hours. Hemoglobin A1C: No results for input(s): HGBA1C in the last 72 hours. Fasting Lipid Panel: No results for input(s): CHOL, HDL, LDLCALC, TRIG, CHOLHDL, LDLDIRECT in the last 72 hours. Thyroid Function Tests: No results for input(s): TSH, T4TOTAL, T3FREE, THYROIDAB in the last 72 hours.  Invalid input(s): FREET3 Anemia Panel: No results for input(s): VITAMINB12, FOLATE, FERRITIN, TIBC, IRON, RETICCTPCT in the last 72 hours.   Radiology: ECHOCARDIOGRAM COMPLETE Result Date: 08/01/2024    ECHOCARDIOGRAM REPORT   Patient Name:   Gregory Humph[facility] Date of Exam: 08/01/2024 Medical Rec #:  [id number]        Height:       70.0 in Accession #:    [id number]       Weight:       180.0 lb Date of Birth:  [birth year]       BSA:          1.996 m Patient Age:    82 years         BP:           136/79 mmHg Patient Gender: M                HR:           87 bpm. Exam Location:  [facility] Procedure: 2D Echo, Cardiac Doppler and Color Doppler (Both Spectral and Color            Flow Doppler were utilized during procedure). Indications:     Dyspnea R06.00  History:         Patient has  prior history of Echocardiogram examinations, most                  recent 06/30/2024.  Sonographer:     Christopher Furnace Referring Phys:  [id number] Jaiquan Temme Diagnosing Phys: Keller Paterson  Sonographer Comments: Suboptimal parasternal window. IMPRESSIONS  1. Left ventricular ejection fraction, by estimation, is 60 to 65%. The left ventricle has normal function. The left ventricle has no regional wall motion abnormalities. There is mild left ventricular hypertrophy. Left ventricular diastolic parameters are consistent with Grade I diastolic dysfunction (impaired relaxation).  2. Right ventricular systolic function is normal. The right  ventricular size is normal.  3. The mitral valve is normal in structure. Trivial mitral valve regurgitation.  4. The aortic valve is tricuspid. Aortic valve regurgitation is mild. FINDINGS  Left Ventricle: Left ventricular ejection fraction, by estimation, is 60 to 65%. The left ventricle has normal function. The left ventricle has no regional wall motion abnormalities. The left ventricular internal cavity size was normal in size. There is  mild left ventricular hypertrophy. Left ventricular diastolic parameters are consistent with Grade I diastolic dysfunction (impaired relaxation). Right Ventricle: The right ventricular size is normal. No increase in right ventricular wall thickness. Right ventricular systolic function is normal. Left Atrium: Left atrial size was normal in size. Right Atrium: Right atrial size was normal in size. Pericardium: There is no evidence of pericardial effusion. Mitral Valve: The mitral valve is normal in structure. Trivial mitral valve regurgitation. MV peak gradient, 3.9 mmHg. The mean mitral valve gradient is 2.0 mmHg. Tricuspid Valve: The tricuspid valve is normal in structure. Tricuspid valve regurgitation is trivial. Aortic Valve: The aortic valve is tricuspid. Aortic valve regurgitation is mild. Aortic valve mean gradient measures 3.5 mmHg. Aortic valve peak gradient measures 5.1 mmHg. Aortic valve area, by VTI measures 3.52 cm. Pulmonic Valve: The pulmonic valve was not well visualized. Pulmonic valve regurgitation is not visualized. Aorta: The aortic root is normal in size and structure. IAS/Shunts: The atrial septum is grossly normal.  LEFT VENTRICLE PLAX 2D LVIDd:         3.40 cm   Diastology LVIDs:         2.30 cm   LV e' medial:    4.90 cm/s LV PW:         1.20 cm   LV E/e' medial:  10.6 LV IVS:        1.20 cm   LV e' lateral:   10.30 cm/s LVOT diam:     2.00 cm   LV E/e' lateral: 5.0 LV SV:         65 LV SV Index:   33 LVOT Area:     3.14 cm  RIGHT VENTRICLE RV Basal diam:   3.00 cm RV Mid diam:    2.00 cm RV S prime:     15.80 cm/s TAPSE (M-mode): 1.7 cm LEFT ATRIUM           Index        RIGHT ATRIUM          Index LA diam:      3.60 cm 1.80 cm/m   RA Area:     4.89 cm LA Vol (A2C): 28.2 ml 14.13 ml/m  RA Volume:   5.52 ml  2.77 ml/m LA Vol (A4C): 9.9 ml  4.98 ml/m  AORTIC VALVE AV Area (Vmax):    3.14 cm AV Area (Vmean):   2.87 cm AV Area (VTI):  3.52 cm AV Vmax:           113.00 cm/s AV Vmean:          87.200 cm/s AV VTI:            0.186 m AV Peak Grad:      5.1 mmHg AV Mean Grad:      3.5 mmHg LVOT Vmax:         113.00 cm/s LVOT Vmean:        79.800 cm/s LVOT VTI:          0.208 m LVOT/AV VTI ratio: 1.12  AORTA Ao Root diam: 2.80 cm MITRAL VALVE               TRICUSPID VALVE MV Area (PHT): 2.53 cm    TR Peak grad:   15.8 mmHg MV Area VTI:   2.43 cm    TR Vmax:        199.00 cm/s MV Peak grad:  3.9 mmHg MV Mean grad:  2.0 mmHg    SHUNTS MV Vmax:       0.98 m/s    Systemic VTI:  0.21 m MV Vmean:      62.2 cm/s   Systemic Diam: 2.00 cm MV Decel Time: 300 msec MV E velocity: 51.90 cm/s MV A velocity: 88.00 cm/s MV E/A ratio:  0.59 Keller Paterson Electronically signed by Keller Paterson Signature Date/Time: 08/01/2024/4:48:00 PM    Final    CT ABDOMEN PELVIS W CONTRAST Result Date: 08/01/2024 CLINICAL DATA:  Bowel obstruction suspected EXAM: CT ABDOMEN AND PELVIS WITH CONTRAST TECHNIQUE: Multidetector CT imaging of the abdomen and pelvis was performed using the standard protocol following bolus administration of intravenous contrast. RADIATION DOSE REDUCTION: This exam was performed according to the departmental dose-optimization program which includes automated exposure control, adjustment of the mA and/or kV according to patient size and/or use of iterative reconstruction technique. CONTRAST:  OMNIPAQUE  IOHEXOL  300 MG/ML  SOLN COMPARISON:  CT 09/17/2017 FINDINGS: Lower chest: Lung bases are clear. Hepatobiliary: No focal hepatic lesion. Several 5-10 mm gallstones  within lumen the gallbladder. No gallbladder inflammation. Common bile duct normal caliber. No biliary duct dilatation. Common bile duct is normal. Pancreas: Pancreas is normal. No ductal dilatation. No pancreatic inflammation. Spleen: Normal spleen Adrenals/urinary tract: Adrenal glands and kidneys are normal. The ureters and bladder normal. Stomach/Bowel: Stomach, duodenum proximal small bowel normal. The distal small bowel is fluid-filled but not significantly dilated measuring up to 2.2 cm. Terminal ileum is normal. No caliber significant caliber change. The appendix is mildly dilated at 11 mm (image 64/2. There is enhancement of the wall of the appendix which measures 3 mm in thickness. There is small amount of fluid inflammation within the inferior RIGHT pericolic gutter adjacent to the appendix (image 61/2). Findings are most consistent acute appendicitis. No perforation or abscess. Appendix in typical location extending inferior from the cecum in the iliac fossa. The colon and rectosigmoid colon are normal. Multiple diverticula of the descending colon and sigmoid colon without acute inflammation. Vascular/Lymphatic: Abdominal aorta is normal caliber. No periportal or retroperitoneal adenopathy. No pelvic adenopathy. Reproductive: Unremarkable Other: No intraperitoneal free air Musculoskeletal: No aggressive osseous lesion. IMPRESSION: 1. Findings most consistent with acute appendicitis. No perforation or abscess. 2. Fluid-filled distal small bowel is favored mild ileus. 3. Cholelithiasis without evidence cholecystitis. 4. LEFT colon diverticulosis without evidence diverticulitis. Electronically Signed   By: Jackquline Boxer M.D.   On: 08/01/2024 12:08   DG Abd 1  View Result Date: 08/01/2024 CLINICAL DATA:  [id number] Abdominal pain [id number] EXAM: ABDOMEN - 1 VIEW COMPARISON:  09/17/2017 FINDINGS: A couple of gas-filled, mildly dilated segments of small bowel noted in the left upper quadrant, measuring up to 3.1  cm. The right lower quadrant small bowel appears decompressed.No pneumoperitoneum. No organomegaly or radiopaque calculi. No acute fracture or destructive lesion. Multilevel degenerative disc disease of the spine. IMPRESSION: A couple of gas-filled, mildly dilated segments of small bowel noted in the left upper quadrant measuring up to 3.1 cm. If there is clinical concern for underlying small-bowel obstruction, a follow-up CT of the abdomen and pelvis with IV contrast would be recommended for further characterization. Alternatively, serial KUB follow-up could be considered. Electronically Signed   By: Rogelia Myers M.D.   On: 08/01/2024 08:45   US  Abdomen Limited RUQ (LIVER/GB) Result Date: 08/01/2024 CLINICAL DATA:  [id number].  Right upper quadrant pain. EXAM: ULTRASOUND ABDOMEN LIMITED RIGHT UPPER QUADRANT COMPARISON:  CT without contrast 09/17/2017. FINDINGS: Gallbladder: There are layering shadowing stones in the gallbladder, largest measured is 1.5 cm. There is no wall thickening, positive sonographic Murphy's sign or pericholecystic fluid. Common bile duct: Diameter: Obscured by bowel gas and by diffuse attenuation from the liver. Liver: No focal lesion identified. The liver is diffusely attenuating and echogenic. This is usually due to hepatic steatosis. Portal vein is patent on color Doppler imaging with normal direction of blood flow towards the liver. Other: No right upper quadrant ascites. IMPRESSION: 1. Cholelithiasis without evidence of acute cholecystitis. 2. Common bile duct is obscured by bowel gas and by diffuse attenuation from the liver. 3. Diffusely attenuating and echogenic liver, usually due to hepatic steatosis. Electronically Signed   By: Francis Quam M.D.   On: 08/01/2024 06:32   DG Chest Portable 1 View Result Date: 07/31/2024 EXAM: 1 VIEW XRAY OF THE CHEST 07/31/2024 09:22:00 PM COMPARISON: 12/04/2016 CLINICAL HISTORY: Fever. Pt comes with HTN, nausea and the shakes. Pt states no cp  or sob. Pt states he was at church and got home and this started. Pt takes bp meds and has taken them today. Wife reports the bp readings were in the 200s at home. Wife also states his sugars have been little elevated. Pt is diabetic. Pt now stating he was having trouble getting out bed. Wife states the pt is now getting confused. The pt repeats Im getting confused. Wife reports hx of stroke and brain bleed in past. FINDINGS: LUNGS AND PLEURA: No focal pulmonary opacity. No pulmonary edema. No pleural effusion. No pneumothorax. HEART AND MEDIASTINUM: No acute abnormality of the cardiac and mediastinal silhouettes. Thoracic aortic atherosclerosis. BONES AND SOFT TISSUES: No acute osseous abnormality. Defibrillator pad overlying the left hemithorax. IMPRESSION: 1. No acute abnormalities. Electronically signed by: Pinkie Pebbles MD 07/31/2024 09:25 PM EDT RP Workstation: [id number]   CT HEAD WO CONTRAST ( ) Result Date: 07/31/2024 CLINICAL DATA:  Confusion EXAM: CT HEAD WITHOUT CONTRAST TECHNIQUE: Contiguous axial images were obtained from the base of the skull through the vertex without intravenous contrast. RADIATION DOSE REDUCTION: This exam was performed according to the departmental dose-optimization program which includes automated exposure control, adjustment of the mA and/or kV according to patient size and/or use of iterative reconstruction technique. COMPARISON:  01/11/2023 FINDINGS: Brain: Chronic extra-axial collection noted under the left craniotomy defect measuring approximately 5 mm in thickness, unchanged. No underlying mass effect or midline shift. There is atrophy and chronic small vessel disease changes. Encephalomalacia within the inferior left frontal and temporal  lobes, stable. No acute intracranial abnormality. Specifically, no hemorrhage, hydrocephalus, mass lesion, acute infarction, or significant intracranial injury. Vascular: No hyperdense vessel or unexpected calcification. Skull:  Prior left temporal craniotomy. No acute calvarial abnormality. Sinuses/Orbits: No acute findings Other: None IMPRESSION: Atrophy, chronic microvascular disease. No acute intracranial abnormality. Stable inferior left frontal and temporal encephalomalacia. Stable small low-density extra-axial fluid collection underlying the left side craniotomy defect. Electronically Signed   By: Franky Crease M.D.   On: 07/31/2024 20:07    ECHO as above  TELEMETRY reviewed by me 08/03/2024: SR rate 60s  EKG reviewed by me: SVT rate 174 bpm  Data reviewed by me 08/03/2024: last 24h vitals tele labs imaging I/O ED provider note, admission H&P, hospitalist progress notes, surgery notes  Principal Problem:   PSVT (paroxysmal supraventricular tachycardia) Active Problems:   Coronary artery disease   Dyslipidemia   Type 2 diabetes mellitus without complications (HCC)   Essential hypertension    ASSESSMENT AND PLAN:  Gregory Humphrey is a 83 y.o. male  with a past medical history of CAD s/p DES to mid LAD 2018, hx CVA and subdural hematoma, hypertension, hyperlipidemia, type II diabetes, seizure disorder who presented to the ED on 07/31/2024 for chills, significantly elevated BP. EKG on admission showed SVT and he converted with adenosine . Cardiology was consulted for further evaluation.   # Supraventricular tachycardia # Acute appendicitis # CAD s/p DES to mid LAD 2018 # Hypertension Patient with hx of CAD and hypertension presented with concerns of chills/elevated BP. While in the ED he went into SVT with HR in the 170s and received one dose of adenosine  with successful conversion to NSR. Has been noted to have low grade fevers since admission, no clear cause identified as of yet. Echo with EF 60-65%, no WMAs, mild LVH, grade I diastolic dysfunction, trivial MR.  -Continue metoprolol  succinate 75 mg daily.  -Continue to treat causes of increased adrenergic tone including fever, infection, hypoxia, pain, etc.    -Continue home lisinopril , imdur , hydralazine .  -Continue home crestor . Resume plavix  75 mg daily - ok per surgery. Aspirin  was DC by Dr. Florencio in July, patient was not taking this prior to admission.   Cardiology will sign off. Please haiku with questions or re-engage if needed. Follow up with Dr. Florencio in 1-2 weeks.   This patient's plan of care was discussed and created with Dr. Wilburn and he is in agreement.  Signed: Danita Bloch, PA-C  08/03/2024, 8:22 AM [facility] Cardiology

## 2024-08-03 NOTE — Assessment & Plan Note (Signed)
 Could be secondary to acute appendicitis.  Will hold off on Glucophage for another few days.

## 2024-08-03 NOTE — Discharge Summary (Signed)
 Physician Discharge Summary   Patient: Gregory Humphrey MRN: [id number] DOB: 09-30-1941  Admit date:     07/31/2024  Discharge date: 08/03/24  Discharge Physician: Charlie Patterson   PCP: Lenon Layman ORN, MD   Recommendations at discharge:   Follow-up PCP 5 days Follow-up general surgery 2 weeks Follow-up cardiology  Discharge Diagnoses: Principal Problem:   PSVT (paroxysmal supraventricular tachycardia) Active Problems:   Acute perforated appendicitis   Essential hypertension   Coronary artery disease   Type 2 diabetes mellitus without complications (HCC)   Dyslipidemia   Lactic acidosis    Hospital Course: 83 y.o. male with medical history significant for type 2 diabetes mellitus, Gilbert syndrome, hypertension, seizure disorder, CVA and history of subdural hematoma, who presented to the emergency room with acute onset of shivering all over with cold chills per his wife and elevated blood pressure that was up to 227/111 and later 203/108.  The patient denied any headache or dizziness or blurred vision, paresthesias or focal muscle weakness.  He was noted to have low-grade fever of 100.3.  He has been having nausea and indigestion vomiting or abdominal pain or diarrhea or melena or bright red bleeding per rectum.  He denied any chest pain or palpitations.  He has been having dyspnea with the symptoms and denied any cough or wheezing.  No dysuria, oliguria or hematuria or flank pain.   ED Course: When he came to the ER, BP was 146/89 with otherwise normal vital signs.  Labs revealed a blood glucose of 167 with CO2 of 21, total bili 1.3 otherwise unremarkable CMP.  High sensitive troponin I was 8 and later 17.  Lactic acid was 2.3 and 0.9.  CBC showed thrombocytopenia 129.  PT and INR were normal.  Urinalysis showed 150 glucose 100 protein and was otherwise unremarkable.  Blood cultures were drawn as well as respiratory panel. EKG as reviewed by me : EKG showed SVT with a rate of  174 and ST segment depression laterally and anterolateral septally. After IV adenosine  EKG showed sinus tachycardia with rate 111 with prolonged PR interval. Imaging: Portable chest x-ray showed no acute cardiopulmonary disease.   The patient was given 2 g of IV Rocephin , 1 L bolus of IV normal saline in addition to IV adenosine .  He will be admitted to a progressive unit bed for further evaluation and management.  9/23 went to the operating room and had robotic assisted laparoscopic appendectomy for acute perforated appendicitis. 9/24.  Cleared by surgical team to go home without antibiotics and as needed pain medication.  Assessment and Plan: * PSVT (paroxysmal supraventricular tachycardia) The patient converted with IV adenosine . Toprol  increased to 75 mg daily.  Patient's sinus 69 bpm in the morning of discharge.  Acute perforated appendicitis Patient went to the operating room on 9/23 by Dr. Rodolph.  Robotic assisted laparoscopic appendectomy done for acute perforated appendicitis.  Cleared by general surgery team to go home off antibiotics and pain medications as needed.  Essential hypertension On Toprol -XL  Coronary artery disease Cleared to go back on Plavix   Type 2 diabetes mellitus without complications (HCC) Can go back on Glucophage in a couple days  Lactic acidosis Could be secondary to acute appendicitis.  Will hold off on Glucophage for another few days.  Dyslipidemia On Crestor          Consultants: Cardiology, general surgery Procedures performed: Robotic laparoscopic appendectomy Disposition: Home Diet recommendation:  Cardiac and Carb modified diet DISCHARGE MEDICATION: Allergies as of 08/03/2024  No Known Allergies      Medication List     STOP taking these medications    aspirin  EC 81 MG tablet   metFORMIN 500 MG 24 hr tablet Commonly known as: GLUCOPHAGE-XR       TAKE these medications    acetaminophen  500 MG tablet Commonly  known as: TYLENOL  Take 500-1,000 mg by mouth See admin instructions. Take 2 tablets (1000mg ) by mouth every morning and take 1 tablet (500mg ) by mouth every night   ascorbic acid  500 MG tablet Commonly known as: VITAMIN C  Take 500 mg by mouth daily with lunch.   cholecalciferol  25 MCG (1000 UNIT) tablet Commonly known as: VITAMIN D3 Take 1,000 Units by mouth daily with lunch.   clopidogrel  75 MG tablet Commonly known as: PLAVIX  Take 75 mg by mouth daily.   hydrALAZINE  10 MG tablet Commonly known as: APRESOLINE  Take 20 mg by mouth 3 (three) times daily with meals.   HYDROcodone -acetaminophen  5-325 MG tablet Commonly known as: NORCO/VICODIN Take 1 tablet by mouth every 8 (eight) hours as needed for up to 3 days.   isosorbide  mononitrate 30 MG 24 hr tablet Commonly known as: IMDUR  Take 15 mg by mouth daily with breakfast.   lamoTRIgine  25 MG tablet Commonly known as: LAMICTAL  Take 75 mg by mouth daily with breakfast.   lisinopril  40 MG tablet Commonly known as: ZESTRIL  Take 40 mg by mouth daily.   metoprolol  succinate 25 MG 24 hr tablet Commonly known as: TOPROL -XL Take 3 tablets (75 mg total) by mouth daily with breakfast. Take with or immediately following a meal. What changed:  medication strength how much to take additional instructions   multivitamin tablet Take 1 tablet by mouth daily with lunch.   rosuvastatin  10 MG tablet Commonly known as: CRESTOR  Take 10 mg by mouth daily with supper.        Follow-up Information     Florencio Cara BIRCH, MD. Go in 1 week(s).   Specialties: Cardiology, Internal Medicine Why: Hopsital Follow-Up: October 6th @ 10:30 AM Contact information: [address] [place] [address] [phone]         Rodolph Romano, MD Follow up in 2 week(s).   Specialty: General Surgery Why: robotic assisted lap appendectomy f/u  Hospital Follow-Up: October 7th @ 8:45 AM. Please bring your Insurance card and list of  medications to the appointment. Contact information: [address] [place] [address] [phone]         Lenon Layman ORN, MD Follow up in 5 day(s).   Specialty: Internal Medicine Why: Hospital Follow-Up: September 29th @ 1:15 PM. Contact information: [address] [facility] [place] [address] [phone]                Discharge Exam: Fredricka Weights   08/01/24 0317  Weight: 81.6 kg   Physical Exam HENT:     Head: Normocephalic.  Eyes:     General: Lids are normal.  Cardiovascular:     Rate and Rhythm: Normal rate and regular rhythm.     Heart sounds: Normal heart sounds, S1 normal and S2 normal.  Pulmonary:     Breath sounds: No decreased breath sounds, wheezing, rhonchi or rales.  Abdominal:     Palpations: Abdomen is soft.     Tenderness: There is no abdominal tenderness.  Musculoskeletal:     Right lower leg: No swelling.     Left lower leg: No swelling.  Skin:  General: Skin is warm.     Findings: No rash.  Neurological:     Mental Status: He is alert.      Condition at discharge: stable  The results of significant diagnostics from this hospitalization (including imaging, microbiology, ancillary and laboratory) are listed below for reference.   Imaging Studies: ECHOCARDIOGRAM COMPLETE Result Date: 08/01/2024    ECHOCARDIOGRAM REPORT   Patient Name:   RYER ASATO [facility] Date of Exam: 08/01/2024 Medical Rec #:  [id number]        Height:       70.0 in Accession #:    [id number]       Weight:       180.0 lb Date of Birth:  [birth year]       BSA:          1.996 m Patient Age:    82 years         BP:           136/79 mmHg Patient Gender: M                HR:           87 bpm. Exam Location:  [facility] Procedure: 2D Echo, Cardiac Doppler and Color Doppler (Both Spectral and Color            Flow Doppler were utilized during procedure). Indications:     Dyspnea R06.00  History:         Patient has prior history of  Echocardiogram examinations, most                  recent 06/30/2024.  Sonographer:     Christopher Furnace Referring Phys:  [id number] CARALYN HUDSON Diagnosing Phys: Keller Paterson  Sonographer Comments: Suboptimal parasternal window. IMPRESSIONS  1. Left ventricular ejection fraction, by estimation, is 60 to 65%. The left ventricle has normal function. The left ventricle has no regional wall motion abnormalities. There is mild left ventricular hypertrophy. Left ventricular diastolic parameters are consistent with Grade I diastolic dysfunction (impaired relaxation).  2. Right ventricular systolic function is normal. The right ventricular size is normal.  3. The mitral valve is normal in structure. Trivial mitral valve regurgitation.  4. The aortic valve is tricuspid. Aortic valve regurgitation is mild. FINDINGS  Left Ventricle: Left ventricular ejection fraction, by estimation, is 60 to 65%. The left ventricle has normal function. The left ventricle has no regional wall motion abnormalities. The left ventricular internal cavity size was normal in size. There is  mild left ventricular hypertrophy. Left ventricular diastolic parameters are consistent with Grade I diastolic dysfunction (impaired relaxation). Right Ventricle: The right ventricular size is normal. No increase in right ventricular wall thickness. Right ventricular systolic function is normal. Left Atrium: Left atrial size was normal in size. Right Atrium: Right atrial size was normal in size. Pericardium: There is no evidence of pericardial effusion. Mitral Valve: The mitral valve is normal in structure. Trivial mitral valve regurgitation. MV peak gradient, 3.9 mmHg. The mean mitral valve gradient is 2.0 mmHg. Tricuspid Valve: The tricuspid valve is normal in structure. Tricuspid valve regurgitation is trivial. Aortic Valve: The aortic valve is tricuspid. Aortic valve regurgitation is mild. Aortic valve mean gradient measures 3.5 mmHg. Aortic valve peak gradient  measures 5.1 mmHg. Aortic valve area, by VTI measures 3.52 cm. Pulmonic Valve: The pulmonic valve was not well visualized. Pulmonic valve regurgitation is not visualized. Aorta: The aortic root is normal in size and structure. IAS/Shunts: The atrial septum  is grossly normal.  LEFT VENTRICLE PLAX 2D LVIDd:         3.40 cm   Diastology LVIDs:         2.30 cm   LV e' medial:    4.90 cm/s LV PW:         1.20 cm   LV E/e' medial:  10.6 LV IVS:        1.20 cm   LV e' lateral:   10.30 cm/s LVOT diam:     2.00 cm   LV E/e' lateral: 5.0 LV SV:         65 LV SV Index:   33 LVOT Area:     3.14 cm  RIGHT VENTRICLE RV Basal diam:  3.00 cm RV Mid diam:    2.00 cm RV S prime:     15.80 cm/s TAPSE (M-mode): 1.7 cm LEFT ATRIUM           Index        RIGHT ATRIUM          Index LA diam:      3.60 cm 1.80 cm/m   RA Area:     4.89 cm LA Vol (A2C): 28.2 ml 14.13 ml/m  RA Volume:   5.52 ml  2.77 ml/m LA Vol (A4C): 9.9 ml  4.98 ml/m  AORTIC VALVE AV Area (Vmax):    3.14 cm AV Area (Vmean):   2.87 cm AV Area (VTI):     3.52 cm AV Vmax:           113.00 cm/s AV Vmean:          87.200 cm/s AV VTI:            0.186 m AV Peak Grad:      5.1 mmHg AV Mean Grad:      3.5 mmHg LVOT Vmax:         113.00 cm/s LVOT Vmean:        79.800 cm/s LVOT VTI:          0.208 m LVOT/AV VTI ratio: 1.12  AORTA Ao Root diam: 2.80 cm MITRAL VALVE               TRICUSPID VALVE MV Area (PHT): 2.53 cm    TR Peak grad:   15.8 mmHg MV Area VTI:   2.43 cm    TR Vmax:        199.00 cm/s MV Peak grad:  3.9 mmHg MV Mean grad:  2.0 mmHg    SHUNTS MV Vmax:       0.98 m/s    Systemic VTI:  0.21 m MV Vmean:      62.2 cm/s   Systemic Diam: 2.00 cm MV Decel Time: 300 msec MV E velocity: 51.90 cm/s MV A velocity: 88.00 cm/s MV E/A ratio:  0.59 Keller Paterson Electronically signed by Keller Paterson Signature Date/Time: 08/01/2024/4:48:00 PM    Final    CT ABDOMEN PELVIS W CONTRAST Result Date: 08/01/2024 CLINICAL DATA:  Bowel obstruction suspected EXAM: CT ABDOMEN AND  PELVIS WITH CONTRAST TECHNIQUE: Multidetector CT imaging of the abdomen and pelvis was performed using the standard protocol following bolus administration of intravenous contrast. RADIATION DOSE REDUCTION: This exam was performed according to the departmental dose-optimization program which includes automated exposure control, adjustment of the mA and/or kV according to patient size and/or use of iterative reconstruction technique. CONTRAST:  OMNIPAQUE  IOHEXOL  300 MG/ML  SOLN COMPARISON:  CT 09/17/2017 FINDINGS: Lower chest: Lung  bases are clear. Hepatobiliary: No focal hepatic lesion. Several 5-10 mm gallstones within lumen the gallbladder. No gallbladder inflammation. Common bile duct normal caliber. No biliary duct dilatation. Common bile duct is normal. Pancreas: Pancreas is normal. No ductal dilatation. No pancreatic inflammation. Spleen: Normal spleen Adrenals/urinary tract: Adrenal glands and kidneys are normal. The ureters and bladder normal. Stomach/Bowel: Stomach, duodenum proximal small bowel normal. The distal small bowel is fluid-filled but not significantly dilated measuring up to 2.2 cm. Terminal ileum is normal. No caliber significant caliber change. The appendix is mildly dilated at 11 mm (image 64/2. There is enhancement of the wall of the appendix which measures 3 mm in thickness. There is small amount of fluid inflammation within the inferior RIGHT pericolic gutter adjacent to the appendix (image 61/2). Findings are most consistent acute appendicitis. No perforation or abscess. Appendix in typical location extending inferior from the cecum in the iliac fossa. The colon and rectosigmoid colon are normal. Multiple diverticula of the descending colon and sigmoid colon without acute inflammation. Vascular/Lymphatic: Abdominal aorta is normal caliber. No periportal or retroperitoneal adenopathy. No pelvic adenopathy. Reproductive: Unremarkable Other: No intraperitoneal free air Musculoskeletal:  No aggressive osseous lesion. IMPRESSION: 1. Findings most consistent with acute appendicitis. No perforation or abscess. 2. Fluid-filled distal small bowel is favored mild ileus. 3. Cholelithiasis without evidence cholecystitis. 4. LEFT colon diverticulosis without evidence diverticulitis. Electronically Signed   By: Jackquline Boxer M.D.   On: 08/01/2024 12:08   DG Abd 1 View Result Date: 08/01/2024 CLINICAL DATA:  [id number] Abdominal pain [id number] EXAM: ABDOMEN - 1 VIEW COMPARISON:  09/17/2017 FINDINGS: A couple of gas-filled, mildly dilated segments of small bowel noted in the left upper quadrant, measuring up to 3.1 cm. The right lower quadrant small bowel appears decompressed.No pneumoperitoneum. No organomegaly or radiopaque calculi. No acute fracture or destructive lesion. Multilevel degenerative disc disease of the spine. IMPRESSION: A couple of gas-filled, mildly dilated segments of small bowel noted in the left upper quadrant measuring up to 3.1 cm. If there is clinical concern for underlying small-bowel obstruction, a follow-up CT of the abdomen and pelvis with IV contrast would be recommended for further characterization. Alternatively, serial KUB follow-up could be considered. Electronically Signed   By: Rogelia Myers M.D.   On: 08/01/2024 08:45   US  Abdomen Limited RUQ (LIVER/GB) Result Date: 08/01/2024 CLINICAL DATA:  [id number].  Right upper quadrant pain. EXAM: ULTRASOUND ABDOMEN LIMITED RIGHT UPPER QUADRANT COMPARISON:  CT without contrast 09/17/2017. FINDINGS: Gallbladder: There are layering shadowing stones in the gallbladder, largest measured is 1.5 cm. There is no wall thickening, positive sonographic Murphy's sign or pericholecystic fluid. Common bile duct: Diameter: Obscured by bowel gas and by diffuse attenuation from the liver. Liver: No focal lesion identified. The liver is diffusely attenuating and echogenic. This is usually due to hepatic steatosis. Portal vein is patent on color Doppler  imaging with normal direction of blood flow towards the liver. Other: No right upper quadrant ascites. IMPRESSION: 1. Cholelithiasis without evidence of acute cholecystitis. 2. Common bile duct is obscured by bowel gas and by diffuse attenuation from the liver. 3. Diffusely attenuating and echogenic liver, usually due to hepatic steatosis. Electronically Signed   By: Francis Quam M.D.   On: 08/01/2024 06:32   DG Chest Portable 1 View Result Date: 07/31/2024 EXAM: 1 VIEW XRAY OF THE CHEST 07/31/2024 09:22:00 PM COMPARISON: 12/04/2016 CLINICAL HISTORY: Fever. Pt comes with HTN, nausea and the shakes. Pt states no cp or sob. Pt states he  was at church and got home and this started. Pt takes bp meds and has taken them today. Wife reports the bp readings were in the 200s at home. Wife also states his sugars have been little elevated. Pt is diabetic. Pt now stating he was having trouble getting out bed. Wife states the pt is now getting confused. The pt repeats Im getting confused. Wife reports hx of stroke and brain bleed in past. FINDINGS: LUNGS AND PLEURA: No focal pulmonary opacity. No pulmonary edema. No pleural effusion. No pneumothorax. HEART AND MEDIASTINUM: No acute abnormality of the cardiac and mediastinal silhouettes. Thoracic aortic atherosclerosis. BONES AND SOFT TISSUES: No acute osseous abnormality. Defibrillator pad overlying the left hemithorax. IMPRESSION: 1. No acute abnormalities. Electronically signed by: Pinkie Pebbles MD 07/31/2024 09:25 PM EDT RP Workstation: [id number]   CT HEAD WO CONTRAST ( ) Result Date: 07/31/2024 CLINICAL DATA:  Confusion EXAM: CT HEAD WITHOUT CONTRAST TECHNIQUE: Contiguous axial images were obtained from the base of the skull through the vertex without intravenous contrast. RADIATION DOSE REDUCTION: This exam was performed according to the departmental dose-optimization program which includes automated exposure control, adjustment of the mA and/or kV according to  patient size and/or use of iterative reconstruction technique. COMPARISON:  01/11/2023 FINDINGS: Brain: Chronic extra-axial collection noted under the left craniotomy defect measuring approximately 5 mm in thickness, unchanged. No underlying mass effect or midline shift. There is atrophy and chronic small vessel disease changes. Encephalomalacia within the inferior left frontal and temporal lobes, stable. No acute intracranial abnormality. Specifically, no hemorrhage, hydrocephalus, mass lesion, acute infarction, or significant intracranial injury. Vascular: No hyperdense vessel or unexpected calcification. Skull: Prior left temporal craniotomy. No acute calvarial abnormality. Sinuses/Orbits: No acute findings Other: None IMPRESSION: Atrophy, chronic microvascular disease. No acute intracranial abnormality. Stable inferior left frontal and temporal encephalomalacia. Stable small low-density extra-axial fluid collection underlying the left side craniotomy defect. Electronically Signed   By: Franky Crease M.D.   On: 07/31/2024 20:07    Microbiology: Results for orders placed or performed during the hospital encounter of 07/31/24  Resp panel by RT-PCR (RSV, Flu A&B, Covid) Anterior Nasal Swab     Status: None   Collection Time: 07/31/24  8:32 PM   Specimen: Anterior Nasal Swab  Result Value Ref Range Status   SARS Coronavirus 2 by RT PCR NEGATIVE NEGATIVE Final    Comment: (NOTE) SARS-CoV-2 target nucleic acids are NOT DETECTED.  The SARS-CoV-2 RNA is generally detectable in upper respiratory specimens during the acute phase of infection. The lowest concentration of SARS-CoV-2 viral copies this assay can detect is 138 copies/mL. A negative result does not preclude SARS-Cov-2 infection and should not be used as the sole basis for treatment or other patient management decisions. A negative result may occur with  improper specimen collection/handling, submission of specimen other than nasopharyngeal  swab, presence of viral mutation(s) within the areas targeted by this assay, and inadequate number of viral copies(<138 copies/mL). A negative result must be combined with clinical observations, patient history, and epidemiological information. The expected result is Negative.  Fact Sheet for Patients:  BloggerCourse.com  Fact Sheet for Healthcare Providers:  SeriousBroker.it  This test is no t yet approved or cleared by the [place]  FDA and  has been authorized for detection and/or diagnosis of SARS-CoV-2 by FDA under an Emergency Use Authorization (EUA). This EUA will remain  in effect (meaning this test can be used) for the duration of the COVID-19 declaration under Section 564(b)(1) of the Act, 21  U.S.C.section 360bbb-3(b)(1), unless the authorization is terminated  or revoked sooner.       Influenza A by PCR NEGATIVE NEGATIVE Final   Influenza B by PCR NEGATIVE NEGATIVE Final    Comment: (NOTE) The Xpert Xpress SARS-CoV-2/FLU/RSV plus assay is intended as an aid in the diagnosis of influenza from Nasopharyngeal swab specimens and should not be used as a sole basis for treatment. Nasal washings and aspirates are unacceptable for Xpert Xpress SARS-CoV-2/FLU/RSV testing.  Fact Sheet for Patients: BloggerCourse.com  Fact Sheet for Healthcare Providers: SeriousBroker.it  This test is not yet approved or cleared by the [place]  FDA and has been authorized for detection and/or diagnosis of SARS-CoV-2 by FDA under an Emergency Use Authorization (EUA). This EUA will remain in effect (meaning this test can be used) for the duration of the COVID-19 declaration under Section 564(b)(1) of the Act, 21 U.S.C. section 360bbb-3(b)(1), unless the authorization is terminated or revoked.     Resp Syncytial Virus by PCR NEGATIVE NEGATIVE Final    Comment: (NOTE) Fact Sheet for  Patients: BloggerCourse.com  Fact Sheet for Healthcare Providers: SeriousBroker.it  This test is not yet approved or cleared by the [place]  FDA and has been authorized for detection and/or diagnosis of SARS-CoV-2 by FDA under an Emergency Use Authorization (EUA). This EUA will remain in effect (meaning this test can be used) for the duration of the COVID-19 declaration under Section 564(b)(1) of the Act, 21 U.S.C. section 360bbb-3(b)(1), unless the authorization is terminated or revoked.  Performed at [facility], [address] [place], [place] [address]   Blood culture (routine x 2)     Status: None (Preliminary result)   Collection Time: 07/31/24 11:48 PM   Specimen: BLOOD  Result Value Ref Range Status   Specimen Description BLOOD RIGHT ANTECUBITAL  Final   Special Requests   Final    BOTTLES DRAWN AEROBIC AND ANAEROBIC Blood Culture results may not be optimal due to an inadequate volume of blood received in culture bottles   Culture   Final    NO GROWTH 3 DAYS Performed at [facility], [address] [place], [place] [address]    Report Status PENDING  Incomplete  Blood culture (routine x 2)     Status: None (Preliminary result)   Collection Time: 07/31/24 11:48 PM   Specimen: BLOOD  Result Value Ref Range Status   Specimen Description BLOOD LEFT ANTECUBITAL  Final   Special Requests   Final    BOTTLES DRAWN AEROBIC AND ANAEROBIC Blood Culture adequate volume   Culture   Final    NO GROWTH 3 DAYS Performed at [facility], [address] [place], [place] [address]    Report Status PENDING  Incomplete  Respiratory (~20 pathogens) panel by PCR     Status: None   Collection Time: 07/31/24 11:48 PM   Specimen: Nasopharyngeal Swab; Respiratory  Result Value Ref Range Status   Adenovirus NOT DETECTED NOT DETECTED Final   Coronavirus 229E NOT DETECTED NOT DETECTED Final    Comment:  (NOTE) The Coronavirus on the Respiratory Panel, DOES NOT test for the novel  Coronavirus (2019 nCoV)    Coronavirus HKU1 NOT DETECTED NOT DETECTED Final   Coronavirus NL63 NOT DETECTED NOT DETECTED Final   Coronavirus OC43 NOT DETECTED NOT DETECTED Final   Metapneumovirus NOT DETECTED NOT DETECTED Final   Rhinovirus / Enterovirus NOT DETECTED NOT DETECTED Final   Influenza A NOT DETECTED NOT DETECTED Final   Influenza  B NOT DETECTED NOT DETECTED Final   Parainfluenza Virus 1 NOT DETECTED NOT DETECTED Final   Parainfluenza Virus 2 NOT DETECTED NOT DETECTED Final   Parainfluenza Virus 3 NOT DETECTED NOT DETECTED Final   Parainfluenza Virus 4 NOT DETECTED NOT DETECTED Final   Respiratory Syncytial Virus NOT DETECTED NOT DETECTED Final   Bordetella pertussis NOT DETECTED NOT DETECTED Final   Bordetella Parapertussis NOT DETECTED NOT DETECTED Final   Chlamydophila pneumoniae NOT DETECTED NOT DETECTED Final   Mycoplasma pneumoniae NOT DETECTED NOT DETECTED Final    Comment: Performed at [facility], [address] [place], [place] [address]    Labs: CBC: Recent Labs  Lab 07/31/24 2032 08/01/24 0503 08/02/24 0353 08/03/24 0413  WBC 5.9 10.6* 8.1 6.6  NEUTROABS 5.6  --   --   --   HGB 15.7 14.8 13.9 13.3  HCT 45.2 41.9 40.1 38.1*  MCV 92.8 93.5 94.1 94.1  PLT 129* 143* 113* 126*   Basic Metabolic Panel: Recent Labs  Lab 07/31/24 2032 08/01/24 0503 08/02/24 0353 08/03/24 0413  NA 137 134* 136 138  K 3.6 3.8 3.9 4.3  CL 103 103 107 107  CO2 21* 21* 20* 21*  GLUCOSE 167* 173* 177* 249*  BUN 22 16 17 21   CREATININE 1.14 0.92 0.98 1.11  CALCIUM  9.5 8.8* 8.7* 8.6*  MG  --  2.1 2.1  --    Liver Function Tests: Recent Labs  Lab 07/31/24 2032  AST 29  ALT 36  ALKPHOS 50  BILITOT 1.3*  PROT 7.4  ALBUMIN 4.4   CBG: Recent Labs  Lab 08/02/24 1547 08/02/24 1731  GLUCAP 166* 166*    Discharge time spent: greater than 30 minutes.  Signed: Charlie Patterson, MD [facility] 08/03/2024

## 2024-08-03 NOTE — Progress Notes (Signed)
 [facility]- General Surgery  SURGICAL PROGRESS NOTE  Hospital Day(s): 3.   Post op day(s): 1 Day Post-Op.   Interval History:  Patient seen and examined. This morning patient is in a great spirit. Has been tolerating regular diet. Denies any nausea or vomiting. Abdominal discomfort has been minimal. Wife at bedside states he had a bowel movement. No complaints.    Vital signs in last 24 hours: [min-max] current  Temp:  [97.5 F (36.4 C)-99.5 F (37.5 C)] 97.5 F (36.4 C) (09/24 0410) Pulse Rate:  [63-79] 63 (09/24 0410) Resp:  [9-21] 20 (09/24 0410) BP: (138-161)/(71-87) 141/72 (09/24 0410) SpO2:  [93 %-99 %] 97 % (09/24 0410)     Height: 5' 10 (177.8 cm) Weight: 81.6 kg BMI (Calculated): 25.83   Intake/Output last 2 shifts:  09/23 0701 - 09/24 0700 In: 500 [I.V.:500] Out: 900 [Urine:900]   Physical Exam:  Constitutional: alert, cooperative and no distress  Respiratory: breathing non-labored at rest  Cardiovascular: regular rate and sinus rhythm  Gastrointestinal: soft, generalized tenderness, and non-distended, surgical incisions are clean and dry with surgical glue intact.  Labs:     Latest Ref Rng & Units 08/03/2024    4:13 AM 08/02/2024    3:53 AM 08/01/2024    5:03 AM  CBC  WBC 4.0 - 10.5 K/uL 6.6  8.1  10.6   Hemoglobin 13.0 - 17.0 g/dL 86.6  86.0  85.1   Hematocrit 39.0 - 52.0 % 38.1  40.1  41.9   Platelets 150 - 400 K/uL 126  113  143       Latest Ref Rng & Units 08/03/2024    4:13 AM 08/02/2024    3:53 AM 08/01/2024    5:03 AM  CMP  Glucose 70 - 99 mg/dL 750  822  826   BUN 8 - 23 mg/dL 21  17  16    Creatinine 0.61 - 1.24 mg/dL 8.88  9.01  9.07   Sodium 135 - 145 mmol/L 138  136  134   Potassium 3.5 - 5.1 mmol/L 4.3  3.9  3.8   Chloride 98 - 111 mmol/L 107  107  103   CO2 22 - 32 mmol/L 21  20  21    Calcium  8.9 - 10.3 mg/dL 8.6  8.7  8.8     Imaging studies: No new pertinent imaging studies   Assessment/Plan:  83 y.o. male with acute  appendicitis 1 Day Post-Op s/p robotic assisted laparoscopic appendectomy, complicated by pertinent comorbidities including recent history of PSVT, essential hypertension, CAD, type 2 diabetes without complications, and history of TIA, and dyslipidemia.   - Stable vital signs, no fever not tachycardic  - No leukocytosis  - Patient is overall recovering well. Has been tolerating regular diet. Denies any nausea or vomiting. Normal return of GI function reported. Abdominal pain is minimal.  Skin incisions are clean and dry. Patient is cleared for discharge from surgical standpoint once medically cleared.  I have provided discharge instructions.  Will follow-up with Dr. Cintron in 2 weeks outpatient.    -- Gilmer Cea PA-C

## 2024-08-03 NOTE — Plan of Care (Signed)

## 2024-08-03 NOTE — Assessment & Plan Note (Signed)
 Patient went to the operating room on 9/23 by Dr. Rodolph.  Robotic assisted laparoscopic appendectomy done for acute perforated appendicitis.  Cleared by general surgery team to go home off antibiotics and pain medications as needed.

## 2024-08-03 NOTE — Hospital Course (Signed)
 83 y.o. male with medical history significant for type 2 diabetes mellitus, Gilbert syndrome, hypertension, seizure disorder, CVA and history of subdural hematoma, who presented to the emergency room with acute onset of shivering all over with cold chills per his wife and elevated blood pressure that was up to 227/111 and later 203/108.  The patient denied any headache or dizziness or blurred vision, paresthesias or focal muscle weakness.  He was noted to have low-grade fever of 100.3.  He has been having nausea and indigestion vomiting or abdominal pain or diarrhea or melena or bright red bleeding per rectum.  He denied any chest pain or palpitations.  He has been having dyspnea with the symptoms and denied any cough or wheezing.  No dysuria, oliguria or hematuria or flank pain.   ED Course: When he came to the ER, BP was 146/89 with otherwise normal vital signs.  Labs revealed a blood glucose of 167 with CO2 of 21, total bili 1.3 otherwise unremarkable CMP.  High sensitive troponin I was 8 and later 17.  Lactic acid was 2.3 and 0.9.  CBC showed thrombocytopenia 129.  PT and INR were normal.  Urinalysis showed 150 glucose 100 protein and was otherwise unremarkable.  Blood cultures were drawn as well as respiratory panel. EKG as reviewed by me : EKG showed SVT with a rate of 174 and ST segment depression laterally and anterolateral septally. After IV adenosine  EKG showed sinus tachycardia with rate 111 with prolonged PR interval. Imaging: Portable chest x-ray showed no acute cardiopulmonary disease.   The patient was given 2 g of IV Rocephin , 1 L bolus of IV normal saline in addition to IV adenosine .  He will be admitted to a progressive unit bed for further evaluation and management.  9/23 went to the operating room and had robotic assisted laparoscopic appendectomy for acute perforated appendicitis. 9/24.  Cleared by surgical team to go home without antibiotics and as needed pain medication.

## 2024-08-04 LAB — SURGICAL PATHOLOGY

## 2024-08-05 LAB — CULTURE, BLOOD (ROUTINE X 2)
Culture: NO GROWTH
Culture: NO GROWTH
Special Requests: ADEQUATE

## 2024-08-08 DIAGNOSIS — R350 Frequency of micturition: Secondary | ICD-10-CM | POA: Diagnosis not present

## 2024-08-08 DIAGNOSIS — Z23 Encounter for immunization: Secondary | ICD-10-CM | POA: Diagnosis not present

## 2024-08-08 DIAGNOSIS — K3589 Other acute appendicitis without perforation or gangrene: Secondary | ICD-10-CM | POA: Diagnosis not present

## 2024-08-15 DIAGNOSIS — E1122 Type 2 diabetes mellitus with diabetic chronic kidney disease: Secondary | ICD-10-CM | POA: Diagnosis not present

## 2024-08-15 DIAGNOSIS — G250 Essential tremor: Secondary | ICD-10-CM | POA: Diagnosis not present

## 2024-08-15 DIAGNOSIS — I252 Old myocardial infarction: Secondary | ICD-10-CM | POA: Diagnosis not present

## 2024-08-15 DIAGNOSIS — I10 Essential (primary) hypertension: Secondary | ICD-10-CM | POA: Diagnosis not present

## 2024-08-15 DIAGNOSIS — G459 Transient cerebral ischemic attack, unspecified: Secondary | ICD-10-CM | POA: Diagnosis not present

## 2024-08-15 DIAGNOSIS — G40909 Epilepsy, unspecified, not intractable, without status epilepticus: Secondary | ICD-10-CM | POA: Diagnosis not present

## 2024-08-15 DIAGNOSIS — I2510 Atherosclerotic heart disease of native coronary artery without angina pectoris: Secondary | ICD-10-CM | POA: Diagnosis not present

## 2024-08-15 DIAGNOSIS — Z955 Presence of coronary angioplasty implant and graft: Secondary | ICD-10-CM | POA: Diagnosis not present

## 2024-08-15 DIAGNOSIS — N1831 Chronic kidney disease, stage 3a: Secondary | ICD-10-CM | POA: Diagnosis not present

## 2024-08-15 DIAGNOSIS — E7800 Pure hypercholesterolemia, unspecified: Secondary | ICD-10-CM | POA: Diagnosis not present

## 2024-08-15 DIAGNOSIS — R0789 Other chest pain: Secondary | ICD-10-CM | POA: Diagnosis not present
